# Patient Record
Sex: Female | Born: 1935 | Race: White | Hispanic: No | Marital: Married | Smoking: Former smoker
Health system: Southern US, Community
[De-identification: ages and names within clinical notes are randomized; demographics above are authoritative.]

## PROBLEM LIST (undated history)

## (undated) DIAGNOSIS — H3530 Unspecified macular degeneration: Secondary | ICD-10-CM

## (undated) DIAGNOSIS — I4891 Unspecified atrial fibrillation: Secondary | ICD-10-CM

## (undated) DIAGNOSIS — N200 Calculus of kidney: Secondary | ICD-10-CM

## (undated) DIAGNOSIS — H9190 Unspecified hearing loss, unspecified ear: Secondary | ICD-10-CM

## (undated) DIAGNOSIS — S065XAA Traumatic subdural hemorrhage with loss of consciousness status unknown, initial encounter: Secondary | ICD-10-CM

## (undated) DIAGNOSIS — I4892 Unspecified atrial flutter: Secondary | ICD-10-CM

## (undated) DIAGNOSIS — I10 Essential (primary) hypertension: Secondary | ICD-10-CM

## (undated) DIAGNOSIS — R569 Unspecified convulsions: Secondary | ICD-10-CM

## (undated) DIAGNOSIS — E785 Hyperlipidemia, unspecified: Secondary | ICD-10-CM

## (undated) DIAGNOSIS — S065X9A Traumatic subdural hemorrhage with loss of consciousness of unspecified duration, initial encounter: Secondary | ICD-10-CM

## (undated) HISTORY — DX: Hyperlipidemia, unspecified: E78.5

## (undated) HISTORY — DX: Unspecified atrial fibrillation: I48.91

## (undated) HISTORY — DX: Calculus of kidney: N20.0

## (undated) HISTORY — DX: Traumatic subdural hemorrhage with loss of consciousness status unknown, initial encounter: S06.5XAA

## (undated) HISTORY — DX: Unspecified convulsions: R56.9

## (undated) HISTORY — DX: Unspecified atrial flutter: I48.92

## (undated) HISTORY — DX: Traumatic subdural hemorrhage with loss of consciousness of unspecified duration, initial encounter: S06.5X9A

## (undated) HISTORY — DX: Unspecified hearing loss, unspecified ear: H91.90

## (undated) HISTORY — DX: Unspecified macular degeneration: H35.30

## (undated) HISTORY — PX: KIDNEY STONE SURGERY: SHX686

---

## 1995-09-24 HISTORY — PX: CATARACT EXTRACTION: SUR2

## 2001-03-12 ENCOUNTER — Ambulatory Visit (HOSPITAL_COMMUNITY): Admission: RE | Admit: 2001-03-12 | Discharge: 2001-03-12 | Payer: Self-pay | Admitting: Obstetrics and Gynecology

## 2001-03-12 ENCOUNTER — Encounter: Payer: Self-pay | Admitting: Obstetrics and Gynecology

## 2001-03-31 ENCOUNTER — Other Ambulatory Visit: Admission: RE | Admit: 2001-03-31 | Discharge: 2001-03-31 | Payer: Self-pay | Admitting: Obstetrics and Gynecology

## 2001-04-13 ENCOUNTER — Other Ambulatory Visit: Admission: RE | Admit: 2001-04-13 | Discharge: 2001-04-13 | Payer: Self-pay | Admitting: Dermatology

## 2001-04-16 ENCOUNTER — Ambulatory Visit (HOSPITAL_COMMUNITY): Admission: RE | Admit: 2001-04-16 | Discharge: 2001-04-16 | Payer: Self-pay | Admitting: Pulmonary Disease

## 2002-04-12 ENCOUNTER — Encounter: Payer: Self-pay | Admitting: Obstetrics and Gynecology

## 2002-04-12 ENCOUNTER — Ambulatory Visit (HOSPITAL_COMMUNITY): Admission: RE | Admit: 2002-04-12 | Discharge: 2002-04-12 | Payer: Self-pay | Admitting: Obstetrics and Gynecology

## 2002-10-04 ENCOUNTER — Other Ambulatory Visit: Admission: RE | Admit: 2002-10-04 | Discharge: 2002-10-04 | Payer: Self-pay | Admitting: Dermatology

## 2003-10-07 ENCOUNTER — Ambulatory Visit (HOSPITAL_COMMUNITY): Admission: RE | Admit: 2003-10-07 | Discharge: 2003-10-07 | Payer: Self-pay | Admitting: Pulmonary Disease

## 2003-11-03 ENCOUNTER — Other Ambulatory Visit: Admission: RE | Admit: 2003-11-03 | Discharge: 2003-11-03 | Payer: Self-pay | Admitting: Dermatology

## 2004-12-25 ENCOUNTER — Ambulatory Visit (HOSPITAL_COMMUNITY): Admission: RE | Admit: 2004-12-25 | Discharge: 2004-12-25 | Payer: Self-pay | Admitting: Pulmonary Disease

## 2005-12-27 ENCOUNTER — Ambulatory Visit (HOSPITAL_COMMUNITY): Admission: RE | Admit: 2005-12-27 | Discharge: 2005-12-27 | Payer: Self-pay | Admitting: Obstetrics and Gynecology

## 2006-09-23 HISTORY — PX: SHOULDER SURGERY: SHX246

## 2008-09-23 HISTORY — PX: ROTATOR CUFF REPAIR: SHX139

## 2018-09-23 HISTORY — PX: OTHER SURGICAL HISTORY: SHX169

## 2020-05-24 LAB — CBC AND DIFFERENTIAL
HCT: 42 (ref 36–46)
Hemoglobin: 14.4 (ref 12.0–16.0)
Platelets: 300 (ref 150–399)
WBC: 7.8

## 2020-05-24 LAB — LIPID PANEL
Cholesterol: 235 — AB (ref 0–200)
Triglycerides: 125 (ref 40–160)

## 2020-05-24 LAB — BASIC METABOLIC PANEL
CO2: 19 (ref 13–22)
Chloride: 97 — AB (ref 99–108)
Potassium: 4.7 (ref 3.4–5.3)
Sodium: 134 — AB (ref 137–147)

## 2020-05-24 LAB — HEPATIC FUNCTION PANEL
ALT: 16 (ref 7–35)
AST: 17 (ref 13–35)

## 2020-05-24 LAB — TSH: TSH: 3.24 (ref 0.41–5.90)

## 2020-05-24 LAB — COMPREHENSIVE METABOLIC PANEL
Albumin: 4.6 (ref 3.5–5.0)
Calcium: 10.5 (ref 8.7–10.7)
Globulin: 2.9

## 2020-05-24 LAB — CBC: RBC: 4.2 (ref 3.87–5.11)

## 2020-06-25 ENCOUNTER — Other Ambulatory Visit: Payer: Self-pay

## 2020-06-25 ENCOUNTER — Emergency Department (HOSPITAL_COMMUNITY): Payer: Medicare Other

## 2020-06-25 ENCOUNTER — Encounter (HOSPITAL_COMMUNITY): Payer: Self-pay | Admitting: Emergency Medicine

## 2020-06-25 ENCOUNTER — Inpatient Hospital Stay (HOSPITAL_COMMUNITY)
Admission: EM | Admit: 2020-06-25 | Discharge: 2020-06-29 | DRG: 066 | Disposition: A | Discharge status: Home / Self Care | Payer: Medicare Other | Attending: Family Medicine | Admitting: Family Medicine

## 2020-06-25 DIAGNOSIS — S065X9A Traumatic subdural hemorrhage with loss of consciousness of unspecified duration, initial encounter: Secondary | ICD-10-CM | POA: Diagnosis not present

## 2020-06-25 DIAGNOSIS — R569 Unspecified convulsions: Secondary | ICD-10-CM | POA: Diagnosis present

## 2020-06-25 DIAGNOSIS — I6202 Nontraumatic subacute subdural hemorrhage: Principal | ICD-10-CM | POA: Diagnosis present

## 2020-06-25 DIAGNOSIS — Z882 Allergy status to sulfonamides status: Secondary | ICD-10-CM

## 2020-06-25 DIAGNOSIS — Z23 Encounter for immunization: Secondary | ICD-10-CM

## 2020-06-25 DIAGNOSIS — R29810 Facial weakness: Secondary | ICD-10-CM | POA: Diagnosis present

## 2020-06-25 DIAGNOSIS — G8321 Monoplegia of upper limb affecting right dominant side: Secondary | ICD-10-CM | POA: Diagnosis present

## 2020-06-25 DIAGNOSIS — Z91013 Allergy to seafood: Secondary | ICD-10-CM

## 2020-06-25 DIAGNOSIS — Z20822 Contact with and (suspected) exposure to covid-19: Secondary | ICD-10-CM | POA: Diagnosis present

## 2020-06-25 DIAGNOSIS — I959 Hypotension, unspecified: Secondary | ICD-10-CM | POA: Diagnosis not present

## 2020-06-25 DIAGNOSIS — S065XAA Traumatic subdural hemorrhage with loss of consciousness status unknown, initial encounter: Secondary | ICD-10-CM

## 2020-06-25 DIAGNOSIS — I10 Essential (primary) hypertension: Secondary | ICD-10-CM | POA: Diagnosis present

## 2020-06-25 DIAGNOSIS — R4781 Slurred speech: Secondary | ICD-10-CM | POA: Diagnosis present

## 2020-06-25 HISTORY — DX: Calculus of kidney: N20.0

## 2020-06-25 HISTORY — DX: Essential (primary) hypertension: I10

## 2020-06-25 LAB — DIFFERENTIAL
Abs Immature Granulocytes: 0.02 10*3/uL (ref 0.00–0.07)
Basophils Absolute: 0 10*3/uL (ref 0.0–0.1)
Basophils Relative: 1 %
Eosinophils Absolute: 0 10*3/uL (ref 0.0–0.5)
Eosinophils Relative: 1 %
Immature Granulocytes: 0 %
Lymphocytes Relative: 24 %
Lymphs Abs: 1.3 10*3/uL (ref 0.7–4.0)
Monocytes Absolute: 1 10*3/uL (ref 0.1–1.0)
Monocytes Relative: 18 %
Neutro Abs: 3.1 10*3/uL (ref 1.7–7.7)
Neutrophils Relative %: 56 %

## 2020-06-25 LAB — PROTIME-INR
INR: 1.1 (ref 0.8–1.2)
Prothrombin Time: 13.7 seconds (ref 11.4–15.2)

## 2020-06-25 LAB — COMPREHENSIVE METABOLIC PANEL
ALT: 15 U/L (ref 0–44)
AST: 21 U/L (ref 15–41)
Albumin: 4 g/dL (ref 3.5–5.0)
Alkaline Phosphatase: 51 U/L (ref 38–126)
Anion gap: 12 (ref 5–15)
BUN: 13 mg/dL (ref 8–23)
CO2: 20 mmol/L — ABNORMAL LOW (ref 22–32)
Calcium: 9.8 mg/dL (ref 8.9–10.3)
Chloride: 97 mmol/L — ABNORMAL LOW (ref 98–111)
Creatinine, Ser: 0.99 mg/dL (ref 0.44–1.00)
GFR calc Af Amer: 59 mL/min — ABNORMAL LOW (ref >60)
GFR calc non Af Amer: 51 mL/min — ABNORMAL LOW (ref >60)
Glucose, Bld: 103 mg/dL — ABNORMAL HIGH (ref 70–99)
Potassium: 4.3 mmol/L (ref 3.5–5.1)
Sodium: 129 mmol/L — ABNORMAL LOW (ref 135–145)
Total Bilirubin: 1.3 mg/dL — ABNORMAL HIGH (ref 0.3–1.2)
Total Protein: 7.1 g/dL (ref 6.5–8.1)

## 2020-06-25 LAB — RESPIRATORY PANEL BY RT PCR (FLU A&B, COVID)
Influenza A by PCR: NEGATIVE
Influenza B by PCR: NEGATIVE
SARS Coronavirus 2 by RT PCR: NEGATIVE

## 2020-06-25 LAB — CBC
HCT: 38.4 % (ref 36.0–46.0)
Hemoglobin: 12.8 g/dL (ref 12.0–15.0)
MCH: 33.2 pg (ref 26.0–34.0)
MCHC: 33.3 g/dL (ref 30.0–36.0)
MCV: 99.7 fL (ref 80.0–100.0)
Platelets: 318 10*3/uL (ref 150–400)
RBC: 3.85 MIL/uL — ABNORMAL LOW (ref 3.87–5.11)
RDW: 12.6 % (ref 11.5–15.5)
WBC: 5.4 10*3/uL (ref 4.0–10.5)
nRBC: 0 % (ref 0.0–0.2)

## 2020-06-25 LAB — APTT: aPTT: 37 seconds — ABNORMAL HIGH (ref 24–36)

## 2020-06-25 MED ORDER — ACETAMINOPHEN 325 MG PO TABS: 650.0000 mg | ORAL_TABLET | Freq: Four times a day (QID) | ORAL | Status: DC | PRN

## 2020-06-25 MED ORDER — LATANOPROST 0.005 % OP SOLN
1.0000 [drp] | Freq: Every day | OPHTHALMIC | Status: DC
  Administered 2020-06-25 – 2020-06-28 (×4): 1 [drp] via OPHTHALMIC
  Filled 2020-06-25: qty 2.5

## 2020-06-25 MED ORDER — SODIUM CHLORIDE 0.9% FLUSH
3.0000 mL | Freq: Once | INTRAVENOUS | Status: AC
Start: 2020-06-25 — End: 2020-06-26
  Administered 2020-06-26: 3 mL via INTRAVENOUS

## 2020-06-25 MED ORDER — ACETAMINOPHEN 650 MG RE SUPP: 650.0000 mg | Freq: Four times a day (QID) | RECTAL | Status: DC | PRN

## 2020-06-25 MED ORDER — METOPROLOL SUCCINATE ER 50 MG PO TB24
50.0000 mg | ORAL_TABLET | Freq: Every day | ORAL | Status: DC
  Administered 2020-06-26 – 2020-06-29 (×4): 50 mg via ORAL
  Filled 2020-06-25 (×4): qty 1

## 2020-06-25 MED ORDER — RAMIPRIL 5 MG PO CAPS
10.0000 mg | ORAL_CAPSULE | Freq: Every day | ORAL | Status: DC
  Administered 2020-06-26 – 2020-06-27 (×2): 10 mg via ORAL
  Filled 2020-06-25 (×2): qty 2

## 2020-06-25 MED ORDER — POLYETHYLENE GLYCOL 3350 17 G PO PACK: 17.0000 g | PACK | Freq: Every day | ORAL | Status: DC | PRN

## 2020-06-25 MED ORDER — GADOBUTROL 1 MMOL/ML IV SOLN
6.5000 mL | Freq: Once | INTRAVENOUS | Status: AC | PRN
  Administered 2020-06-25: 6.5 mL via INTRAVENOUS

## 2020-06-25 NOTE — Consult Note (Signed)
Consult H&P  CC: transient paroxysmal events  History is obtained from: Patient  HPI: Nichole Cox is a 86 y.o. female recently moved from [place] had 4 brief episodes of cold sensation in her right arm with associated alteration of awareness. These episodes may have lasted 10-15 minutes.  She denies weakness, slurred speech or abnormal movements during these events. She has a remote history of transient global amnesia and she described the alteration of awareness during these events as having similar characteristics.   Imaging of her head showed extra axial hematoma.  Patient denies head trauma.   ROS: A complete ROS was performed and is negative except as noted in the HPI.   Past Medical History:  Diagnosis Date  . Hypertension   . Kidney stone     No family history on file.   Social History:  reports that she has never smoked. She has never used smokeless tobacco. She reports previous alcohol use. She reports that she does not use drugs.   Prior to Admission medications   Medication Sig Start Date End Date Taking? Authorizing Provider  latanoprost (XALATAN) 0.005 % ophthalmic solution Place 1 drop into both eyes at bedtime.   Yes [provider]  metoprolol succinate (TOPROL-XL) 50 MG 24 hr tablet Take 50 mg by mouth daily. Take with or immediately following a meal.   Yes [provider]  Multiple Vitamins-Minerals (PRESERVISION AREDS) CAPS Take 1 capsule by mouth daily.   Yes [provider]  ramipril (ALTACE) 10 MG capsule Take 10 mg by mouth daily.   Yes [provider]   Exam: Current vital signs: BP (!) 142/76 (BP Location: Left Arm)   Pulse 72   Temp 97.9 F (36.6 C) (Oral)   Resp 16   SpO2 100%    Physical Exam  Constitutional: Appears well-developed and well-nourished.  Psych: Affect appropriate to situation Eyes: No scleral injection HENT: No OP obstrucion Head: Normocephalic.  Cardiovascular: Normal rate and  regular rhythm.  Respiratory: Effort normal and breath sounds normal to anterior ascultation GI: Soft.  No distension. There is no tenderness.  Skin: WDI  Neuro: Mental Status: Patient is awake, alert, oriented to person, place, month, year, and situation. Patient is able to give a clear and coherent history. No signs of aphasia or neglect Cranial Nerves: II: Visual Fields are full. Pupils are equal, round, and reactive to light.   III,IV, VI: EOMI without ptosis or diploplia.  V: Facial sensation is symmetric to temperature VII: Facial movement is symmetric.  VIII: hearing is intact to voice X: Uvula elevates symmetrically XI: Shoulder shrug is symmetric. XII: tongue is midline without atrophy or fasciculations.  Motor: Tone is normal. Bulk is normal. 5/5 strength was present in all four extremities.  Sensory: Sensation is symmetric to light touch and temperature in the arms and legs. Deep Tendon Reflexes: 2+ and symmetric in the biceps and patellae.  Plantars: Toes are downgoing bilaterally.  Cerebellar: FNF and HKS are intact bilaterally  I have reviewed the images obtained: NCT head showed complex/loculated left cerebral convexity extra-axial fluid. collection measuring up to 1.5 cm posteriorly. MRI brain showed No acute infarct. Complex left cerebral convexity extra-axial hemorrhage, measuring up to approximately 13 mm posteriorly. Areas of T1 hyperintensity within the collection correlate with areas of hyperdensity seen on recent CT head and are compatible with recent hemorrhage. There is associated local mass effect without midline shift. MRA head and neck showed no evidence of significant arterial stenosis or  occlusion.   Impression: 86 year old woman with paroxysmal events of cold sensation in her right arm and alteration of awareness in setting of non-traumatic extra axial hematoma. Thought the duration of the events are not characteristic of seizure, cannot exclude  postictal state. Neurosurgery evaluated and felt no intervention was necessary. 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   Plan: rEEG Levetiracetam 500mg  two times daily. Will continue to follow.   Electronically signed by: Dr. Pager: [phone] 06/25/2020, 4:46 PM

## 2020-06-25 NOTE — ED Notes (Signed)
Heart healthy dinner tray ordered 

## 2020-06-25 NOTE — Progress Notes (Signed)
FPTS Interim Progress Note  S: She is stable with no complaints, resting comfortably in the ED.  O: BP 97/84   Pulse 83   Temp 97.9 F (36.6 C) (Oral)   Resp (!) 21   SpO2 99%   Physical Exam Vitals and nursing note reviewed.  Constitutional:      General: She is not in acute distress.    Appearance: Normal appearance. She is normal weight. She is not ill-appearing, toxic-appearing or diaphoretic.  HENT:     Head: Normocephalic and atraumatic.  Cardiovascular:     Rate and Rhythm: Normal rate and regular rhythm.     Pulses: Normal pulses.          Radial pulses are 2+ on the right side and 2+ on the left side.       Dorsalis pedis pulses are 2+ on the right side and 2+ on the left side.     Heart sounds: Normal heart sounds, S1 normal and S2 normal. No murmur heard.   Pulmonary:     Effort: Pulmonary effort is normal. No respiratory distress.     Breath sounds: Normal breath sounds. No wheezing.  Abdominal:     General: There is distension.     Tenderness: There is abdominal tenderness in the right upper quadrant and right lower quadrant.  Musculoskeletal:     Right lower leg: No edema.     Left lower leg: No edema.  Neurological:     Mental Status: She is alert. Mental status is at baseline.     Comments: Strength 5/5 Bilateral Upper and Lower extremities.  Psychiatric:        Mood and Affect: Mood normal.        Behavior: Behavior normal.        Thought Content: Thought content normal.      A/P: Will continue with current plans.   Lauro Franklin, MD 06/25/2020, 9:48 PM PGY-1, [facility] pager [phone]

## 2020-06-25 NOTE — ED Notes (Signed)
Pt transported to MRI 

## 2020-06-25 NOTE — ED Provider Notes (Signed)
[facility] EMERGENCY DEPARTMENT Provider Note   CSN: [id number] Arrival date & time: 06/25/20  1215     History Chief Complaint  Patient presents with  . Weakness    Nichole Cox is a 86 y.o. female.  4 episodes a right upper arm weakness and numbness since yesterday at noon.  Last episode was about 2 hours ago lasted about 15 minutes.  Maybe some facial droop and slurred speech during this event.  No symptoms currently.  No recent falls.  Not on any blood thinners.  No history of stroke.  The history is provided by the patient.  Neurologic Problem This is a new problem. The current episode started yesterday. Progression since onset: waxing and waning. Pertinent negatives include no chest Cox, no abdominal Cox, no headaches and no shortness of breath. Nothing aggravates the symptoms. Nothing relieves the symptoms. She has tried nothing for the symptoms. The treatment provided no relief.       Past Medical History:  Diagnosis Date  . Hypertension   . Kidney stone     There are no problems to display for this patient.   History reviewed. No pertinent surgical history.   OB History   No obstetric history on file.     No family history on file.  Social History   Tobacco Use  . Smoking status: Never Smoker  . Smokeless tobacco: Never Used  Substance Use Topics  . Alcohol use: Not Currently  . Drug use: Never    Home Medications Prior to Admission medications   Medication Sig Start Date End Date Taking? Authorizing Provider  latanoprost (XALATAN) 0.005 % ophthalmic solution Place 1 drop into both eyes at bedtime.   Yes [provider]  metoprolol succinate (TOPROL-XL) 50 MG 24 hr tablet Take 50 mg by mouth daily. Take with or immediately following a meal.   Yes [provider]  Multiple Vitamins-Minerals (PRESERVISION AREDS) CAPS Take 1 capsule by mouth daily.   Yes [provider]  ramipril (ALTACE) 10 MG  capsule Take 10 mg by mouth daily.   Yes [provider]    Allergies    Scallops [shellfish allergy] and Sulfa antibiotics  Review of Systems   Review of Systems  Constitutional: Negative for chills and fever.  HENT: Negative for ear Cox and sore throat.   Eyes: Negative for Cox and visual disturbance.  Respiratory: Negative for cough and shortness of breath.   Cardiovascular: Negative for chest Cox and palpitations.  Gastrointestinal: Negative for abdominal Cox and vomiting.  Genitourinary: Negative for dysuria and hematuria.  Musculoskeletal: Negative for arthralgias and back Cox.  Skin: Negative for color change and rash.  Neurological: Positive for speech difficulty, weakness and numbness. Negative for seizures, syncope and headaches.  All other systems reviewed and are negative.   Physical Exam Updated Vital Signs  ED Triage Vitals  Enc Vitals Group     BP 06/25/20 1228 (!) 168/81     Pulse Rate 06/25/20 1228 88     Resp 06/25/20 1228 18     Temp 06/25/20 1228 98.8 F (37.1 C)     Temp Source 06/25/20 1228 Oral     SpO2 06/25/20 1228 100 %     Weight --      Height --      Head Circumference --      Peak Flow --      Cox Score 06/25/20 1229 0     Cox Loc --  Cox Edu? --      Excl. in GC? --     Physical Exam Vitals and nursing note reviewed.  Constitutional:      General: She is not in acute distress.    Appearance: She is well-developed. She is not ill-appearing.  HENT:     Head: Normocephalic and atraumatic.     Nose: Nose normal.     Mouth/Throat:     Mouth: Mucous membranes are moist.  Eyes:     Extraocular Movements: Extraocular movements intact.     Conjunctiva/sclera: Conjunctivae normal.     Pupils: Pupils are equal, round, and reactive to light.  Cardiovascular:     Rate and Rhythm: Normal rate and regular rhythm.     Pulses: Normal pulses.     Heart sounds: Normal heart sounds. No murmur heard.   Pulmonary:      Effort: Pulmonary effort is normal. No respiratory distress.     Breath sounds: Normal breath sounds.  Abdominal:     Palpations: Abdomen is soft.     Tenderness: There is no abdominal tenderness.  Musculoskeletal:     Cervical back: Normal range of motion and neck supple.  Skin:    General: Skin is warm and dry.     Capillary Refill: Capillary refill takes less than 2 seconds.  Neurological:     General: No focal deficit present.     Mental Status: She is alert and oriented to person, place, and time.     Cranial Nerves: No cranial nerve deficit.     Sensory: No sensory deficit.     Motor: No weakness.     Coordination: Coordination normal.     Comments: 5+ out of 5 strength throughout, normal sensation, no drift, normal finger-to-nose finger, normal speech, no facial droop  Psychiatric:        Mood and Affect: Mood normal.     ED Results / Procedures / Treatments   Labs (all labs ordered are listed, but only abnormal results are displayed) Labs Reviewed  APTT - Abnormal; Notable for the following components:      Result Value   aPTT 37 (*)    All other components within normal limits  CBC - Abnormal; Notable for the following components:   RBC 3.85 (*)    All other components within normal limits  COMPREHENSIVE METABOLIC PANEL - Abnormal; Notable for the following components:   Sodium 129 (*)    Chloride 97 (*)    CO2 20 (*)    Glucose, Bld 103 (*)    Total Bilirubin 1.3 (*)    GFR calc non Af Amer 51 (*)    GFR calc Af Amer 59 (*)    All other components within normal limits  RESPIRATORY PANEL BY RT PCR (FLU A&B, COVID)  PROTIME-INR  DIFFERENTIAL    EKG EKG Interpretation  Date/Time:  Sunday June 25 2020 12:24:43 EDT Ventricular Rate:  82 PR Interval:  174 QRS Duration: 66 QT Interval:  358 QTC Calculation: 418 R Axis:   82 Text Interpretation: Normal sinus rhythm Normal ECG Confirmed by Virgina Norfolk [phone]) on 06/25/2020 1:29:02 PM   Radiology CT  HEAD WO CONTRAST  Result Date: 06/25/2020 CLINICAL DATA:  TIA.  Numbness and weakness. EXAM: CT HEAD WITHOUT CONTRAST TECHNIQUE: Contiguous axial images were obtained from the base of the skull through the vertex without intravenous contrast. COMPARISON:  None. FINDINGS: Brain: There is a complex/loculated left cerebral convexity extra-axial collection, which is largely intermediate  density with layering areas of hyperdensity. This collection measures up to 1.5 cm posteriorly (see series 5, image 48). There is resulting largely local mass effect with out substantial midline shift. Basal cisterns are patent. There is is patchy white matter hypoattenuation involving the white matter, which likely represents chronic microvascular ischemic disease. Focal hypodensity in the left basal ganglia, likely remote lacunar infarct or dilated perivascular space. Vascular: Calcific atherosclerosis. Skull: Normal. Negative for fracture or focal lesion. Sinuses/Orbits: No acute finding. Other: No mastoid effusions. IMPRESSION: 1. Complex/loculated left cerebral convexity extra-axial fluid collection measuring up to 1.5 cm posteriorly. While largely intermediate density, there are layering areas of hyperdensity which suggest recent/acute blood products. This collection exerts largely local mass effect without significant midline shift or basilar cistern effacement. 2. There is extensive white matter hypoattenuation which is favored to reflect the sequela of chronic microvascular ischemic disease. No definite evidence of acute large vascular territory infarct; however, a small white matter infarct could easily be obscured and there are no priors for comparison. MRI could better evaluate for acute infarct if clinically indicated. Findingds discussed Dr. Pilar PlateBero at 1:14 pm via telephone Electronically Signed   By: Feliberto HartsFrederick S Jones MD   On: 06/25/2020 13:14    Procedures .Critical Care Performed by: Virgina Norfolkuratolo, Jatziri Goffredo, DO Authorized  by: Virgina Norfolkuratolo, Evalynn Hankins, DO   Critical care provider statement:    Critical care time (minutes):  45   Critical care was necessary to treat or prevent imminent or life-threatening deterioration of the following conditions:  CNS failure or compromise   Critical care was time spent personally by me on the following activities:  Blood draw for specimens, development of treatment plan with patient or surrogate, discussions with consultants, discussions with primary provider, evaluation of patient's response to treatment, examination of patient, obtaining history from patient or surrogate, ordering and performing treatments and interventions, ordering and review of laboratory studies, ordering and review of radiographic studies, pulse oximetry, re-evaluation of patient's condition and review of old charts   I assumed direction of critical care for this patient from another provider in my specialty: no     (including critical care time)  Medications Ordered in ED Medications  sodium chloride flush (NS) 0.9 % injection 3 mL (has no administration in time range)    ED Course  I have reviewed the triage vital signs and the nursing notes.  Pertinent labs & imaging results that were available during my care of the patient were reviewed by me and considered in my medical decision making (see chart for details).    MDM Rules/Calculators/A&P                          Nichole Cox is an 86 year old female history of hypertension who presents to the ED with right arm weakness/strokelike symptoms.  Patient with overall unremarkable vitals.  States that she has had 4 episodes of right arm weakness and numbness.  Patient with symptoms that have resolved now.  Last episode was about 2 hours ago and lasted about 10 or 15 minutes.  She denies any head trauma, falls.  CT scan has already been done prior to my evaluation that shows complex/loculated left cerebral convexity of extra-axial fluid collection up to 1.5  cm posteriorly.  Likely recent/acute blood products given the hyperdensity.  There is some local mass-effect but no obvious midline shift.  Patient neurologically intact now.  Talked with Dr. Thomasena Edisollins with neurology who thinks possibly these  are seizures.  Will touch base with neurosurgery further recommendations.  Patient is not on blood thinners.  Denies any recent traumas or falls.  Lab work otherwise is unremarkable.  Neurology is recommending admission for EEG, MRI, MRA.  Will get neurosurgery plan as well.  Neurosurgery recommends MRI and MRA as well.  No acute surgical intervention needed at this time.  To be admitted for further care with medicine.  Neurology and neurosurgery to evaluate the patient in the emergency department.  Hemodynamically stable throughout my care.  Lab work unremarkable.  This chart was dictated using voice recognition software.  Despite best efforts to proofread,  errors can occur which can change the documentation meaning.    Final Clinical Impression(s) / ED Diagnoses Final diagnoses:  SDH (subdural hematoma) (HCC)  Seizure-like activity [facility])    Rx / DC Orders ED Discharge Orders    None       Virgina Norfolk, DO 06/25/20 1505

## 2020-06-25 NOTE — ED Triage Notes (Addendum)
Pt reports 2 episodes yesterday and 2 today with R arm weakness, numbness, and arm feeling cold.  Symptoms have currently resolved.  No arm drift.  Speech clear.

## 2020-06-25 NOTE — H&P (Addendum)
Family Medicine Teaching [facility] Admission History and Physical Service Pager: [phone]  Patient name: Nichole Cox Medical record number: [id number] Date of birth: 05-20-1929 Age: 86 y.o. Gender: female  Primary Care Provider: Patient, No Pcp Per Consultants: Neurology, neurosurgery Code Status: FULL Nichole Cox - emergency contact.    Chief Complaint: Right arm weakness  Assessment and Plan: Nichole Cox is a 86 y.o. female presenting with episodic right arm weakness found to have SDH. PMH is significant for HTN.  Transient right arm weakness  SDH Has had a total of 4 episodes over the past 2 days, sometimes associated with slurred speech.  Head imaging (CT head, MRI/MRA brain) with no evidence of acute infarct and evidence of left cerebral hemorrhage/SDH up to 13 mm without significant midline shift.  Unclear cause as patient has no history of head trauma and is not on any blood thinners. May be spontaneous.  Could represent TIA, but more likely represents seizures triggered by intracranial bleed irritating the meninges.  Neurology and neurosurgery consulted, appreciate involvement.  We will proceed with nonoperative management per neurosurgery given small size of SDH.  Plan for AED and EEG per neurology. - admit to FPTS, med-tele, Dr. Pollie Meyer attending - EEG tomorrow per neurology - may benefit from AED such as levetiracetam per neurology - AM labs: CBC, BMP, PT-INR, APTT, lipid panel, HbA1c - seizure precautions - cardiac monitoring - vitals per unit routine - PT/OT  HTN Home meds: metoprolol succinate 50 mg daily, ramipril 10 mg daily. Hypertensive to 176/70. - continue home metoprolol succinate and ramipril  FEN/GI: heart healthy diet Prophylaxis: SCDs  Disposition: med-tele  History of Present Illness:  Nichole Cox is a 86 y.o. female presenting with right arm weakness.  Patient began having episodes of right arm weakness starting  yesterday (two episodes yesterday, two episodes today) where she felt like her arm was "going cold" as well. These episodes lasted about 15 minutes. She did have some slurred speech with the episode this morning, but none yesterday. Denies headache, leg weakness. Denies trauma. Not on any blood thinners. No symptoms currently.  Just moved back to [place] with her daughter from [place], [place].   Pt is vaccinated against covid.   Drinks half a drink of scotch nightly.  Denies smoking.  Husband is a retired Sports administrator who has mild Alzheimers. She and him are on waiting list for [facility].     Review Of Systems: Per HPI with the following additions:   Review of Systems  Constitutional: Negative for chills, fever and malaise/fatigue.  Eyes: Negative for blurred vision and double vision.  Cardiovascular: Negative for chest pain, orthopnea and leg swelling.  Gastrointestinal: Negative for constipation, diarrhea, nausea and vomiting.  Genitourinary: Negative for dysuria.  Neurological: Negative for dizziness and headaches.    Patient Active Problem List   Diagnosis Date Noted  . Seizure (HCC) 06/25/2020    Past Medical History: Past Medical History:  Diagnosis Date  . Hypertension   . Kidney stone     Past Surgical History: History reviewed. No pertinent surgical history.  Social History: Social History   Tobacco Use  . Smoking status: Never Smoker  . Smokeless tobacco: Never Used  Substance Use Topics  . Alcohol use: Not Currently  . Drug use: Never   Additional social history: Recently moved from [place], [place] back to  with her daughter. Drinks 1/2 drink of scotch daily. Denies smoking.  Please also refer to relevant sections of EMR.  Family History: No family history on file.  Allergies and Medications: Allergies  Allergen Reactions  . Scallops [Shellfish Allergy] Hives and Shortness Of Breath  . Sulfa Antibiotics Rash   No current facility-administered  medications on file prior to encounter.   Current Outpatient Medications on File Prior to Encounter  Medication Sig Dispense Refill  . latanoprost (XALATAN) 0.005 % ophthalmic solution Place 1 drop into both eyes at bedtime.    . metoprolol succinate (TOPROL-XL) 50 MG 24 hr tablet Take 50 mg by mouth daily. Take with or immediately following a meal.    . Multiple Vitamins-Minerals (PRESERVISION AREDS) CAPS Take 1 capsule by mouth daily.    . ramipril (ALTACE) 10 MG capsule Take 10 mg by mouth daily.      Objective: BP (!) 176/70   Pulse 89   Temp 97.9 F (36.6 C) (Oral)   Resp (!) 24   SpO2 95%  Exam: General: Elderly woman appears younger than stated age, resting comfortably in bed, NAD Eyes: PERRL, EOMI ENTM: [place]/AT, MMM Neck: supple Cardiovascular: RRR, no murmurs Respiratory: CTAB Gastrointestinal: soft, non-tender, +BS MSK: moves all extremities Derm: no rash Neuro: alert, interactive, CN II-XII intact, 5/5 strength to upper and lower extremities bilaterally Psych: affect appropriate  Labs and Imaging: CBC BMET  Recent Labs  Lab 06/25/20 1232  WBC 5.4  HGB 12.8  HCT 38.4  PLT 318   Recent Labs  Lab 06/25/20 1232  NA 129*  K 4.3  CL 97*  CO2 20*  BUN 13  CREATININE 0.99  GLUCOSE 103*  CALCIUM 9.8     CT HEAD WO CONTRAST  Result Date: 06/25/2020 CLINICAL DATA:  TIA.  Numbness and weakness. EXAM: CT HEAD WITHOUT CONTRAST TECHNIQUE: Contiguous axial images were obtained from the base of the skull through the vertex without intravenous contrast. COMPARISON:  None. FINDINGS: Brain: There is a complex/loculated left cerebral convexity extra-axial collection, which is largely intermediate density with layering areas of hyperdensity. This collection measures up to 1.5 cm posteriorly (see series 5, image 48). There is resulting largely local mass effect with out substantial midline shift. Basal cisterns are patent. There is is patchy white matter hypoattenuation  involving the white matter, which likely represents chronic microvascular ischemic disease. Focal hypodensity in the left basal ganglia, likely remote lacunar infarct or dilated perivascular space. Vascular: Calcific atherosclerosis. Skull: Normal. Negative for fracture or focal lesion. Sinuses/Orbits: No acute finding. Other: No mastoid effusions. IMPRESSION: 1. Complex/loculated left cerebral convexity extra-axial fluid collection measuring up to 1.5 cm posteriorly. While largely intermediate density, there are layering areas of hyperdensity which suggest recent/acute blood products. This collection exerts largely local mass effect without significant midline shift or basilar cistern effacement. 2. There is extensive white matter hypoattenuation which is favored to reflect the sequela of chronic microvascular ischemic disease. No definite evidence of acute large vascular territory infarct; however, a small white matter infarct could easily be obscured and there are no priors for comparison. MRI could better evaluate for acute infarct if clinically indicated. Findingds discussed Dr. Pilar Plate at 1:14 pm via telephone Electronically Signed   By: Feliberto Harts MD   On: 06/25/2020 13:14   MR ANGIO HEAD WO CONTRAST  Result Date: 06/25/2020 CLINICAL DATA:  Four episodes of right upper arm weakness EXAM: MRI HEAD WITHOUT CONTRAST MRA HEAD WITHOUT CONTRAST MRA NECK WITHOUT AND WITH CONTRAST TECHNIQUE: Multiplanar, multiecho pulse sequences of the brain and surrounding structures were obtained without intravenous contrast. Angiographic images of the  Circle of Willis were obtained using MRA technique without intravenous contrast. Angiographic images of the neck were obtained using MRA technique without and with intravenous contrast. Carotid stenosis measurements (when applicable) are obtained utilizing NASCET criteria, using the distal internal carotid diameter as the denominator. COMPARISON:  None. FINDINGS: MRI HEAD  FINDINGS Brain: No acute infarct. Redemonstrated complex left cerebral convexity extra-axial hemorrhage, measuring up to approximately 13 mm posteriorly (series 17, image 6). Areas of T1 hyperintensity within the collection correlate with areas of hyperdensity seen on recent CT head and are compatible with recent hemorrhage. There is associated local mass effect without midline shift. Basal cisterns are patent. No hydrocephalus. There is scattered T2/FLAIR hyperintensity within the white matter, compatible with chronic microvascular ischemic disease. T2/FLAIR hyperintensities within bilateral basal ganglia, favored to reflect dilated perivascular spaces Vascular: Flow voids are maintained at the skull base. Skull and upper cervical spine: Normal marrow signal. Sinuses/Orbits: Mild ethmoid air cell mucosal thickening. No air-fluid levels. No acute orbital abnormality. Other: Moderate right and small left mastoid effusions. MRA HEAD FINDINGS Anterior circulation: Petrous and cavernous carotids are patent. There is mild left greater than right paraclinoid ICA narrowing. Bilateral M1 and proximal M2 MCAs are patent. Bilateral A1 and A2 ACA is are patent. No evidence of aneurysm. No specific evidence of vascular malformation or aneurysm. Posterior circulation. There is mild narrowing of the right V4 vertebral artery. The basilar artery is patent without evidence of significant stenosis. Bilateral posterior cerebral arteries are patent without significant stenosis or occlusion. No specific evidence of vascular malformation or aneurysm. MRA NECK FINDINGS Great vessel origins are patent. No evidence of significant (greater than 50%) arterial stenosis or occlusion within the neck. Mild narrowing at bilateral carotid bifurcations, likely related to atherosclerosis without evidence of greater than 50% narrowing of the common carotid arteries or internal carotid arteries. Mildly left dominant vertebral artery. There is mild  irregularity of bilateral V3 vertebral arteries as well as bilateral cervical ICAs. IMPRESSION: 1. No acute infarct. 2. Redemonstrated complex left cerebral convexity extra-axial hemorrhage, measuring up to approximately 13 mm posteriorly. Areas of T1 hyperintensity within the collection correlate with areas of hyperdensity seen on recent CT head and are compatible with recent hemorrhage. There is associated local mass effect without midline shift. 3. No evidence of significant (greater than 50%) arterial stenosis or occlusion within the head or neck. 4. Mild irregularity of bilateral V3 vertebral arteries as well as bilateral cervical ICAs, which may relate to atherosclerosis or fibromuscular dysplasia. Electronically Signed   By: Feliberto HartsFrederick S Jones MD   On: 06/25/2020 16:41   MR Angiogram Neck W or Wo Contrast  Result Date: 06/25/2020 CLINICAL DATA:  Four episodes of right upper arm weakness EXAM: MRI HEAD WITHOUT CONTRAST MRA HEAD WITHOUT CONTRAST MRA NECK WITHOUT AND WITH CONTRAST TECHNIQUE: Multiplanar, multiecho pulse sequences of the brain and surrounding structures were obtained without intravenous contrast. Angiographic images of the Circle of Willis were obtained using MRA technique without intravenous contrast. Angiographic images of the neck were obtained using MRA technique without and with intravenous contrast. Carotid stenosis measurements (when applicable) are obtained utilizing NASCET criteria, using the distal internal carotid diameter as the denominator. COMPARISON:  None. FINDINGS: MRI HEAD FINDINGS Brain: No acute infarct. Redemonstrated complex left cerebral convexity extra-axial hemorrhage, measuring up to approximately 13 mm posteriorly (series 17, image 6). Areas of T1 hyperintensity within the collection correlate with areas of hyperdensity seen on recent CT head and are compatible with recent hemorrhage. There is  associated local mass effect without midline shift. Basal cisterns are  patent. No hydrocephalus. There is scattered T2/FLAIR hyperintensity within the white matter, compatible with chronic microvascular ischemic disease. T2/FLAIR hyperintensities within bilateral basal ganglia, favored to reflect dilated perivascular spaces Vascular: Flow voids are maintained at the skull base. Skull and upper cervical spine: Normal marrow signal. Sinuses/Orbits: Mild ethmoid air cell mucosal thickening. No air-fluid levels. No acute orbital abnormality. Other: Moderate right and small left mastoid effusions. MRA HEAD FINDINGS Anterior circulation: Petrous and cavernous carotids are patent. There is mild left greater than right paraclinoid ICA narrowing. Bilateral M1 and proximal M2 MCAs are patent. Bilateral A1 and A2 ACA is are patent. No evidence of aneurysm. No specific evidence of vascular malformation or aneurysm. Posterior circulation. There is mild narrowing of the right V4 vertebral artery. The basilar artery is patent without evidence of significant stenosis. Bilateral posterior cerebral arteries are patent without significant stenosis or occlusion. No specific evidence of vascular malformation or aneurysm. MRA NECK FINDINGS Great vessel origins are patent. No evidence of significant (greater than 50%) arterial stenosis or occlusion within the neck. Mild narrowing at bilateral carotid bifurcations, likely related to atherosclerosis without evidence of greater than 50% narrowing of the common carotid arteries or internal carotid arteries. Mildly left dominant vertebral artery. There is mild irregularity of bilateral V3 vertebral arteries as well as bilateral cervical ICAs. IMPRESSION: 1. No acute infarct. 2. Redemonstrated complex left cerebral convexity extra-axial hemorrhage, measuring up to approximately 13 mm posteriorly. Areas of T1 hyperintensity within the collection correlate with areas of hyperdensity seen on recent CT head and are compatible with recent hemorrhage. There is  associated local mass effect without midline shift. 3. No evidence of significant (greater than 50%) arterial stenosis or occlusion within the head or neck. 4. Mild irregularity of bilateral V3 vertebral arteries as well as bilateral cervical ICAs, which may relate to atherosclerosis or fibromuscular dysplasia. Electronically Signed   By: Feliberto Harts MD   On: 06/25/2020 16:41   MR BRAIN WO CONTRAST  Result Date: 06/25/2020 CLINICAL DATA:  Four episodes of right upper arm weakness EXAM: MRI HEAD WITHOUT CONTRAST MRA HEAD WITHOUT CONTRAST MRA NECK WITHOUT AND WITH CONTRAST TECHNIQUE: Multiplanar, multiecho pulse sequences of the brain and surrounding structures were obtained without intravenous contrast. Angiographic images of the Circle of Willis were obtained using MRA technique without intravenous contrast. Angiographic images of the neck were obtained using MRA technique without and with intravenous contrast. Carotid stenosis measurements (when applicable) are obtained utilizing NASCET criteria, using the distal internal carotid diameter as the denominator. COMPARISON:  None. FINDINGS: MRI HEAD FINDINGS Brain: No acute infarct. Redemonstrated complex left cerebral convexity extra-axial hemorrhage, measuring up to approximately 13 mm posteriorly (series 17, image 6). Areas of T1 hyperintensity within the collection correlate with areas of hyperdensity seen on recent CT head and are compatible with recent hemorrhage. There is associated local mass effect without midline shift. Basal cisterns are patent. No hydrocephalus. There is scattered T2/FLAIR hyperintensity within the white matter, compatible with chronic microvascular ischemic disease. T2/FLAIR hyperintensities within bilateral basal ganglia, favored to reflect dilated perivascular spaces Vascular: Flow voids are maintained at the skull base. Skull and upper cervical spine: Normal marrow signal. Sinuses/Orbits: Mild ethmoid air cell mucosal  thickening. No air-fluid levels. No acute orbital abnormality. Other: Moderate right and small left mastoid effusions. MRA HEAD FINDINGS Anterior circulation: Petrous and cavernous carotids are patent. There is mild left greater than right paraclinoid ICA narrowing. Bilateral  M1 and proximal M2 MCAs are patent. Bilateral A1 and A2 ACA is are patent. No evidence of aneurysm. No specific evidence of vascular malformation or aneurysm. Posterior circulation. There is mild narrowing of the right V4 vertebral artery. The basilar artery is patent without evidence of significant stenosis. Bilateral posterior cerebral arteries are patent without significant stenosis or occlusion. No specific evidence of vascular malformation or aneurysm. MRA NECK FINDINGS Great vessel origins are patent. No evidence of significant (greater than 50%) arterial stenosis or occlusion within the neck. Mild narrowing at bilateral carotid bifurcations, likely related to atherosclerosis without evidence of greater than 50% narrowing of the common carotid arteries or internal carotid arteries. Mildly left dominant vertebral artery. There is mild irregularity of bilateral V3 vertebral arteries as well as bilateral cervical ICAs. IMPRESSION: 1. No acute infarct. 2. Redemonstrated complex left cerebral convexity extra-axial hemorrhage, measuring up to approximately 13 mm posteriorly. Areas of T1 hyperintensity within the collection correlate with areas of hyperdensity seen on recent CT head and are compatible with recent hemorrhage. There is associated local mass effect without midline shift. 3. No evidence of significant (greater than 50%) arterial stenosis or occlusion within the head or neck. 4. Mild irregularity of bilateral V3 vertebral arteries as well as bilateral cervical ICAs, which may relate to atherosclerosis or fibromuscular dysplasia. Electronically Signed   By: Feliberto Harts MD   On: 06/25/2020 16:41     Littie Deeds,  MD 06/25/2020, 9:33 PM PGY-1, [facility] Family Medicine FPTS Intern pager: [phone], text pages welcome   Resident Addendum I have separately seen and examined the patient.  I have discussed the findings and exam with the resident and agree with the above note.  I helped develop the management plan that is described in the resident's note and I agree with the content.  Changes have been made in BLUE.    Lenor Coffin, MD PGY-3 [facility] residency program

## 2020-06-25 NOTE — Consult Note (Signed)
Chief Complaint   Chief Complaint  Patient presents with  . Weakness    HPI   Consult requested by: Dr Carmie End, EDP [facility] Reason for consult: Left SDH  HPI:  Nichole Cox is a 86 y.o. female with history of HTN who presented to ED after having a fourth episode of right arm "coldness" and weakness.  Over the past 24 hours she has had four episodes of her right arm feeling what she describes as "cold". Associated with slurred speech, ?facial droop, weakness with right grip strength and incoordination of RUE. Episodes last 15-30 minutes when they occur. Her last episode was at noon today.  As part of work up a head CT was obtained which revealed a left sided acute/subacute SDH. A NSY consultation was requested. Neurology has also been consulted and believes symptoms may represent seizure. MRI/MRA brain ordered and pending. Patient feels well currently. No further symptoms since being in the ED. No N/T/W. Not on any blood thinning medications. No recent head trauma. She is currently in the process of moving from [place] back to [place].  There are no problems to display for this patient.   PMH: Past Medical History:  Diagnosis Date  . Hypertension   . Kidney stone     PSH: History reviewed. No pertinent surgical history.  (Not in a hospital admission)   SH: Social History   Tobacco Use  . Smoking status: Never Smoker  . Smokeless tobacco: Never Used  Substance Use Topics  . Alcohol use: Not Currently  . Drug use: Never    MEDS: Prior to Admission medications   Medication Sig Start Date End Date Taking? Authorizing Provider  latanoprost (XALATAN) 0.005 % ophthalmic solution Place 1 drop into both eyes at bedtime.   Yes [provider]  metoprolol succinate (TOPROL-XL) 50 MG 24 hr tablet Take 50 mg by mouth daily. Take with or immediately following a meal.   Yes [provider]  Multiple Vitamins-Minerals (PRESERVISION AREDS) CAPS Take 1 capsule by  mouth daily.   Yes [provider]  ramipril (ALTACE) 10 MG capsule Take 10 mg by mouth daily.   Yes [provider]    ALLERGY: Allergies  Allergen Reactions  . Scallops [Shellfish Allergy] Hives and Shortness Of Breath  . Sulfa Antibiotics Rash    Social History   Tobacco Use  . Smoking status: Never Smoker  . Smokeless tobacco: Never Used  Substance Use Topics  . Alcohol use: Not Currently     No family history on file.   ROS   Review of Systems  All other systems reviewed and are negative.   Exam   Vitals:   06/25/20 1228 06/25/20 1325  BP: (!) 168/81 (!) 142/76  Pulse: 88 72  Resp: 18 16  Temp: 98.8 F (37.1 C) 97.9 F (36.6 C)  SpO2: 100% 100%   General appearance: WDWN, NAD Eyes: No scleral injection Cardiovascular: Regular rate and rhythm without murmurs, rubs, gallops. No edema or variciosities. Distal pulses normal. Pulmonary: Effort normal, non-labored breathing Musculoskeletal:     Muscle tone upper extremities: Normal    Muscle tone lower extremities: Normal    Motor exam: Upper Extremities Deltoid Bicep Tricep Grip  Right 5/5 5/5 5/5 5/5  Left 5/5 5/5 5/5 5/5   Lower Extremity IP Quad PF DF EHL  Right 5/5 5/5 5/5 5/5 5/5  Left 5/5 5/5 5/5 5/5 5/5   Neurological Mental Status:    - Patient is awake, alert,  oriented to person, place, month, year, and situation    - Patient is able to give a clear and coherent history.    - No signs of aphasia or neglect Cranial Nerves    - II: Visual Fields are full. PERRL    - III/IV/VI: EOMI without ptosis or diploplia.     - V: Facial sensation is grossly normal    - VII: Facial movement is symmetric.     - VIII: hearing is intact to voice    - X: Uvula elevates symmetrically    - XI: Shoulder shrug is symmetric.    - XII: tongue is midline without atrophy or fasciculations.  Sensory: Sensation grossly intact to LT Deep Tendon Reflexes    - 2+ and symmetric in the biceps and  patellae. Plantars   - Toes are downgoing bilaterally.  Cerebellar    - FNF and HKS are intact bilaterally Mild right drift   Results - Imaging/Labs   Results for orders placed or performed during the hospital encounter of 06/25/20 (from the past 48 hour(s))  Protime-INR     Status: None   Collection Time: 06/25/20 12:32 PM  Result Value Ref Range   Prothrombin Time 13.7 11.4 - 15.2 seconds   INR 1.1 0.8 - 1.2    Comment: (NOTE) INR goal varies based on device and disease states. Performed at [facility], [address] [place], [place] [address]   APTT     Status: Abnormal   Collection Time: 06/25/20 12:32 PM  Result Value Ref Range   aPTT 37 (H) 24 - 36 seconds    Comment:        IF BASELINE aPTT IS ELEVATED, SUGGEST PATIENT RISK ASSESSMENT BE USED TO DETERMINE APPROPRIATE ANTICOAGULANT THERAPY. Performed at [facility], [address], [place], [place] [address]   CBC     Status: Abnormal   Collection Time: 06/25/20 12:32 PM  Result Value Ref Range   WBC 5.4 4.0 - 10.5 K/uL   RBC 3.85 (L) 3.87 - 5.11 MIL/uL   Hemoglobin 12.8 12.0 - 15.0 g/dL   HCT 09.8 36 - 46 %   MCV 99.7 80.0 - 100.0 fL   MCH 33.2 26.0 - 34.0 pg   MCHC 33.3 30.0 - 36.0 g/dL   RDW 11.9 14.7 - 82.9 %   Platelets 318 150 - 400 K/uL   nRBC 0.0 0.0 - 0.2 %    Comment: Performed at [facility], [address], [place], [place] [address]  Differential     Status: None   Collection Time: 06/25/20 12:32 PM  Result Value Ref Range   Neutrophils Relative % 56 %   Neutro Abs 3.1 1.7 - 7.7 K/uL   Lymphocytes Relative 24 %   Lymphs Abs 1.3 0.7 - 4.0 K/uL   Monocytes Relative 18 %   Monocytes Absolute 1.0 0 - 1 K/uL   Eosinophils Relative 1 %   Eosinophils Absolute 0.0 0 - 0 K/uL   Basophils Relative 1 %   Basophils Absolute 0.0 0 - 0 K/uL   Immature Granulocytes 0 %   Abs Immature Granulocytes 0.02 0.00 - 0.07 K/uL    Comment: Performed at [facility], [address], [place], [place] [address]  Comprehensive metabolic panel     Status: Abnormal   Collection Time: 06/25/20 12:32 PM  Result Value Ref Range   Sodium 129 (L) 135 - 145 mmol/L   Potassium  4.3 3.5 - 5.1 mmol/L   Chloride 97 (L) 98 - 111 mmol/L   CO2 20 (L) 22 - 32 mmol/L   Glucose, Bld 103 (H) 70 - 99 mg/dL    Comment: Glucose reference range applies only to samples taken after fasting for at least 8 hours.   BUN 13 8 - 23 mg/dL   Creatinine, Ser 5.73 0.44 - 1.00 mg/dL   Calcium 9.8 8.9 - 22.0 mg/dL   Total Protein 7.1 6.5 - 8.1 g/dL   Albumin 4.0 3.5 - 5.0 g/dL   AST 21 15 - 41 U/L   ALT 15 0 - 44 U/L   Alkaline Phosphatase 51 38 - 126 U/L   Total Bilirubin 1.3 (H) 0.3 - 1.2 mg/dL   GFR calc non Af Amer 51 (L) >60 mL/min   GFR calc Af Amer 59 (L) >60 mL/min   Anion gap 12 5 - 15    Comment: Performed at [facility], [address], [place], [place] [address]    CT HEAD WO CONTRAST  Result Date: 06/25/2020 CLINICAL DATA:  TIA.  Numbness and weakness. EXAM: CT HEAD WITHOUT CONTRAST TECHNIQUE: Contiguous axial images were obtained from the base of the skull through the vertex without intravenous contrast. COMPARISON:  None. FINDINGS: Brain: There is a complex/loculated left cerebral convexity extra-axial collection, which is largely intermediate density with layering areas of hyperdensity. This collection measures up to 1.5 cm posteriorly (see series 5, image 48). There is resulting largely local mass effect with out substantial midline shift. Basal cisterns are patent. There is is patchy white matter hypoattenuation involving the white matter, which likely represents chronic microvascular ischemic disease. Focal hypodensity in the left basal ganglia, likely remote lacunar infarct or dilated perivascular space. Vascular: Calcific atherosclerosis. Skull: Normal. Negative for fracture or focal lesion. Sinuses/Orbits: No acute finding. Other: No mastoid effusions. IMPRESSION: 1.  Complex/loculated left cerebral convexity extra-axial fluid collection measuring up to 1.5 cm posteriorly. While largely intermediate density, there are layering areas of hyperdensity which suggest recent/acute blood products. This collection exerts largely local mass effect without significant midline shift or basilar cistern effacement. 2. There is extensive white matter hypoattenuation which is favored to reflect the sequela of chronic microvascular ischemic disease. No definite evidence of acute large vascular territory infarct; however, a small white matter infarct could easily be obscured and there are no priors for comparison. MRI could better evaluate for acute infarct if clinically indicated. Findingds discussed Dr. Pilar Plate at 1:14 pm via telephone Electronically Signed   By: Feliberto Harts MD   On: 06/25/2020 13:14   Impression/Plan   86 y.o. female with 4 episodes of transient RUE weakness, incoordination and "coldness" who was found to have an acute/subacute left convexity SDH with local mass effect but no significant midline shift. She is currently asymptomatic and is neurologically intact. Suspect her symptoms are related to seizures from irritation of the SDH.   SDH is not significant enough to require surgical intervention at present. Would recommend completing CVA work up with MRI/MRA per Neurology. Will defer EEG and AED management to them. Plan on repeat head CT in the am to ensure stability. Avoid blood thinning medications. Please call for any concerns.  Cindra Presume, PA-C [facility]

## 2020-06-25 NOTE — ED Notes (Signed)
Pt assisted to the br steady gait  Her daughter has called about her mother update given

## 2020-06-26 ENCOUNTER — Observation Stay (HOSPITAL_COMMUNITY): Payer: Medicare Other

## 2020-06-26 DIAGNOSIS — S065X9A Traumatic subdural hemorrhage with loss of consciousness of unspecified duration, initial encounter: Secondary | ICD-10-CM

## 2020-06-26 DIAGNOSIS — S065XAA Traumatic subdural hemorrhage with loss of consciousness status unknown, initial encounter: Secondary | ICD-10-CM

## 2020-06-26 DIAGNOSIS — R569 Unspecified convulsions: Secondary | ICD-10-CM | POA: Diagnosis not present

## 2020-06-26 LAB — BASIC METABOLIC PANEL
Anion gap: 6 (ref 5–15)
BUN: 13 mg/dL (ref 8–23)
CO2: 28 mmol/L (ref 22–32)
Calcium: 9.3 mg/dL (ref 8.9–10.3)
Chloride: 97 mmol/L — ABNORMAL LOW (ref 98–111)
Creatinine, Ser: 1.11 mg/dL — ABNORMAL HIGH (ref 0.44–1.00)
GFR calc Af Amer: 51 mL/min — ABNORMAL LOW (ref >60)
GFR calc non Af Amer: 44 mL/min — ABNORMAL LOW (ref >60)
Glucose, Bld: 111 mg/dL — ABNORMAL HIGH (ref 70–99)
Potassium: 3.8 mmol/L (ref 3.5–5.1)
Sodium: 131 mmol/L — ABNORMAL LOW (ref 135–145)

## 2020-06-26 LAB — CBC
HCT: 36.2 % (ref 36.0–46.0)
Hemoglobin: 12.5 g/dL (ref 12.0–15.0)
MCH: 34.4 pg — ABNORMAL HIGH (ref 26.0–34.0)
MCHC: 34.5 g/dL (ref 30.0–36.0)
MCV: 99.7 fL (ref 80.0–100.0)
Platelets: 296 10*3/uL (ref 150–400)
RBC: 3.63 MIL/uL — ABNORMAL LOW (ref 3.87–5.11)
RDW: 12.5 % (ref 11.5–15.5)
WBC: 5.6 10*3/uL (ref 4.0–10.5)
nRBC: 0 % (ref 0.0–0.2)

## 2020-06-26 LAB — PROTIME-INR
INR: 1.1 (ref 0.8–1.2)
Prothrombin Time: 13.8 seconds (ref 11.4–15.2)

## 2020-06-26 LAB — LIPID PANEL
Cholesterol: 178 mg/dL (ref 0–200)
HDL: 54 mg/dL (ref >40)
LDL Cholesterol: 109 mg/dL — ABNORMAL HIGH (ref 0–99)
Total CHOL/HDL Ratio: 3.3 RATIO
Triglycerides: 75 mg/dL (ref <150)
VLDL: 15 mg/dL (ref 0–40)

## 2020-06-26 LAB — HEMOGLOBIN A1C
Hgb A1c MFr Bld: 5.6 % (ref 4.8–5.6)
Mean Plasma Glucose: 114.02 mg/dL

## 2020-06-26 LAB — APTT: aPTT: 35 seconds (ref 24–36)

## 2020-06-26 MED ORDER — LEVETIRACETAM IN NACL 500 MG/100ML IV SOLN
500.0000 mg | Freq: Two times a day (BID) | INTRAVENOUS | Status: DC
  Administered 2020-06-26: 500 mg via INTRAVENOUS
  Filled 2020-06-26: qty 100

## 2020-06-26 MED ORDER — SODIUM CHLORIDE 0.9 % IV SOLN
750.0000 mg | Freq: Two times a day (BID) | INTRAVENOUS | Status: DC
  Administered 2020-06-26 – 2020-06-27 (×2): 750 mg via INTRAVENOUS
  Filled 2020-06-26 (×4): qty 7.5

## 2020-06-26 MED ORDER — LEVETIRACETAM IN NACL 500 MG/100ML IV SOLN
500.0000 mg | INTRAVENOUS | Status: AC
  Administered 2020-06-26: 500 mg via INTRAVENOUS
  Filled 2020-06-26: qty 100

## 2020-06-26 NOTE — Evaluation (Addendum)
Occupational Therapy Evaluation Patient Details Name: Nichole Cox MRN: [id number] DOB: [birth year] Today's Date: 06/26/2020    History of Present Illness Patient is an 86 year old female who presents with 4 episodes of R UE weakness and numbness along with facial droop and slurred speech since 06/24/20. Head CT and MRI negative for acute infarct but "evidence of left cerebral hemorrhage/SDH up to 13 mm without significant midline shift", per MD note. 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 Medical hx consisting of HTN and kidney stones.   Clinical Impression   PT admitted with SDH 13 mm with possible seizures. Pt currently with functional limitiations due to the deficits listed below (see OT problem list). Pt noted during session to have the described change in status of R UE weakness, cool/ numb sensation in face/ arm, trouble with word finding and slurred speech, ataxic R UE movement and facial droop R side. Pt states "its happening" Pt aware of the full event and giving feedback as best as she can to staff. The whole event witnessed by RN as well and being documented for time and vitals.  Pt will benefit from skilled OT to increase their independence and safety with adls and balance to allow discharge outpatient.  Pt demonstrates some impulsive behavior and question if hearing is part of the source. Pt also attempting to sit on IV cord and visual deficits present also making this hard to completely rule out. Pt oriented and able to correctly report all events throughout session. Pt responds best to deep voice tone and slow speech     Follow Up Recommendations  Outpatient OT    Equipment Recommendations  None recommended by OT    Recommendations for Other Services       Precautions / Restrictions Precautions Precautions: Fall      Mobility Bed Mobility Overal bed mobility: Modified Independent                Transfers Overall  transfer level: Needs assistance   Transfers: Sit to/from Stand Sit to Stand: Supervision              Balance Overall balance assessment: Mild deficits observed, not formally tested (able to close eyes and touch head to simulate shower (S))                                         ADL either performed or assessed with clinical judgement   ADL Overall ADL's : Needs assistance/impaired     Grooming: Wash/dry hands;Supervision/safety;Standing   Upper Body Bathing: Supervision/ safety;Sitting               Toilet Transfer: Supervision/safety;Ambulation;Regular Social worker and Hygiene: Supervision/safety   Tub/ Engineer, structural: Walk-in shower;Supervision/safety   Functional mobility during ADLs: Supervision/safety General ADL Comments: pt was able to call and order lunch when suddenly her speech became more slurred, decreased word finding, R side of face with droop, R hand changes of sensation and decreased shoulder flexion, pt oriented      Vision Baseline Vision/History: Wears glasses;Macular Degeneration Wears Glasses: Reading only Additional Comments: pt with decreased peripheral vision at baseline     Perception     Praxis      Pertinent Vitals/Pain Pain Assessment: No/denies pain     Hand Dominance Right   Extremity/Trunk Assessment Upper Extremity Assessment Upper  Extremity Assessment: RUE deficits/detail RUE Deficits / Details: pt using RUE WFL even dialing phone when change of motor function occurred. pt with ataxic movement, decreased ability to sustain shoulder flexion with drift, reports cool numb feeling along the medial aspect of her arm and in her face  RUE Sensation: decreased proprioception RUE Coordination: decreased fine motor;decreased gross motor   Lower Extremity Assessment Lower Extremity Assessment: Defer to PT evaluation   Cervical / Trunk Assessment Cervical / Trunk Assessment:  Normal   Communication Communication Communication: HOH   Cognition Arousal/Alertness: Awake/alert Behavior During Therapy: Impulsive Overall Cognitive Status: Within Functional Limits for tasks assessed                                 General Comments: A&Ox4   General Comments  Rn called to room due to change in status at the end of session BP obtained and RN documenting all vitals in chart. Pt tearful due to onset. pt states "you are the first ones to ever see it happen"    Exercises     Shoulder Instructions      Home Living Family/patient expects to be discharged to:: Private residence Living Arrangements: Spouse/significant other;Children Available Help at Discharge: Family;Available 24 hours/day Type of Home: House Home Access: Level entry     Home Layout: Two level;Bed/bath upstairs;1/2 bath on main level Alternate Level Stairs-Number of Steps: flight Alternate Level Stairs-Rails: Right;Left;Can reach both Bathroom Shower/Tub: Tub/shower unit;Walk-in shower   Bathroom Toilet: Standard     Home Equipment: None   Additional Comments: On the waiting list for [facility]      Prior Functioning/Environment Level of Independence: Independent                 OT Problem List: Decreased strength;Decreased activity tolerance;Impaired balance (sitting and/or standing);Decreased coordination;Decreased knowledge of precautions;Impaired UE functional use      OT Treatment/Interventions: Self-care/ADL training;Therapeutic exercise;Energy conservation;Neuromuscular education;DME and/or AE instruction;Manual therapy;Modalities;Therapeutic activities;Patient/family education;Balance training;Cognitive remediation/compensation    OT Goals(Current goals can be found in the care plan section) Acute Rehab OT Goals Patient Stated Goal: to go home and assist her husband OT Goal Formulation: With patient Time For Goal Achievement: 07/10/20 Potential to Achieve  Goals: Good  OT Frequency: Min 2X/week   Barriers to D/C: Decreased caregiver support (caregiver for spouse but daughter can help them)          Co-evaluation              AM-PAC OT "6 Clicks" Daily Activity     Outcome Measure Help from another person eating meals?: A Little Help from another person taking care of personal grooming?: A Little Help from another person toileting, which includes using toliet, bedpan, or urinal?: A Little Help from another person bathing (including washing, rinsing, drying)?: A Little Help from another person to put on and taking off regular upper body clothing?: A Little Help from another person to put on and taking off regular lower body clothing?: A Little 6 Click Score: 18   End of Session Nurse Communication: Mobility status;Precautions  Activity Tolerance: Patient tolerated treatment well Patient left: in chair;with call bell/phone within reach;with nursing/sitter in room;Other (comment) (PA Felicie Morn)  OT Visit Diagnosis: Ataxia, unspecified (R27.0)                Time: 6256-3893 OT Time Calculation (min): 43 min Charges:  OT General Charges $OT Visit: 1 Visit OT  Evaluation $OT Eval Moderate Complexity: 1 Mod OT Treatments $Self Care/Home Management : 8-22 mins   [place], OTR/L  Acute Rehabilitation Services Pager: [phone] Office: [phone] .   Mateo Flow 06/26/2020, 1:24 PM

## 2020-06-26 NOTE — Progress Notes (Signed)
  NEUROSURGERY PROGRESS NOTE   Repeat head CT unchanged. Chart reviewed. Appears patient had another episode of RUE symptoms, speech difficulties. Neurology increased Keppra and ordered LTM EEG. Will follow from afar. Please call for any concerns.

## 2020-06-26 NOTE — Procedures (Signed)
Patient Name: Nichole Cox  MRN: [id number]  Epilepsy Attending: Charlsie Quest  Referring Physician/Provider: Dr Katha Cabal Date: 06/26/2020 Duration: 25.53 minutes  Patient history: 86 year old female with paroxysmal events of cold sensation in her right arm and alteration of awareness in the setting of nontraumatic extra-axial hematoma.  EEG to evaluate for seizures.  Level of alertness: Awake, drowsy  AEDs during EEG study: Keppra  Technical aspects: This EEG study was done with scalp electrodes positioned according to the 10-20 International system of electrode placement. Electrical activity was acquired at a sampling rate of 500Hz  and reviewed with a high frequency filter of 70Hz  and a low frequency filter of 1Hz . EEG data were recorded continuously and digitally stored.   Description: The posterior dominant rhythm consists of 8 Hz activity of moderate voltage (25-35 uV) seen predominantly in posterior head regions, symmetric and reactive to eye opening and eye closing. Drowsiness was characterized by attenuation of the posterior background rhythm. EEG showed intermittent 2 to 3 Hz delta slowing in left temporal region. Physiologic photic driving was not seen during photic stimulation.  Hyperventilation was not performed.     ABNORMALITY -Intermittent slow, left temporal region  IMPRESSION: This study is suggestive of nonspecific cortical dysfunction in left temporal region.  No seizures or epileptiform discharges were seen throughout the recording.   Nalini Alcaraz 

## 2020-06-26 NOTE — Evaluation (Signed)
Physical Therapy Evaluation Patient Details Name: Nichole Cox MRN: [id number] DOB: September 13, 1929 Today's Date: 06/26/2020   History of Present Illness  Patient is an 86 year old female who presents with 4 episodes of R UE weakness and numbness along with facial droop and slurred speech since 06/24/20. Head CT and MRI negative for acute infarct but "evidence of left cerebral hemorrhage/SDH up to 13 mm without significant midline shift", per MD note. 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 Medical hx consisting of HTN and kidney stones.  Clinical Impression  Pt demonstrates weakness in her R shoulder, unsteadiness with standing and gait, impulsive tendencies, and coordination deficits in her R UE. She tends to have minor losses of balance when initially coming to stand and displays mild trunk sway intermittently when ambulating. Pt requires cues to maintain her safety through slowing down her movements and pt educated on gaining her balance prior to moving when initially coming to stand. She also demonstrated moments of slowed speech and difficulty with correct word finding, but would correct herself. She has a history of R rotator cuff injury but reports increased weakness recently compared to normal. The patient did not need an AD at this time, but would benefit from further PT services to address her mentioned deficits to maximize her safety and independence with functional mobility in preparation for discharge home. Outpatient PT services is recommended at this time secondary to her noted new deficits in strength and balance as she was previously independent and needs to care for her husband.    Follow Up Recommendations Outpatient PT;Supervision for mobility/OOB (pt's daughter can drive her)    Equipment Recommendations  None recommended by PT    Recommendations for Other Services       Precautions / Restrictions Precautions Precautions:  Fall Restrictions Weight Bearing Restrictions: No      Mobility  Bed Mobility Overal bed mobility: Independent             General bed mobility comments: Pt able to transition supine > sit EOB within normal time range with the bed flat without assistance.  Transfers Overall transfer level: Needs assistance Equipment used: None Transfers: Sit to/from Stand Sit to Stand: Min guard         General transfer comment: Unsteadiness noted upon initial rise to stand, with pt able to recover balance with min guard. VC's provided to find balance prior to performing standing tasks or ambulating to maintain safety.  Ambulation/Gait Ambulation/Gait assistance: Min guard Gait Distance (Feet): 450 Feet Assistive device: None Gait Pattern/deviations: Step-through pattern (Intermittent lateral trunk sways)   Gait velocity interpretation: >4.37 ft/sec, indicative of normal walking speed General Gait Details: Pt able to change gait speed and head positions without overt LOB or change in gait.  Stairs Stairs: Yes Stairs assistance: Min guard Stair Management: One rail Right;Two rails;Alternating pattern;Sideways;Forwards Number of Stairs: 10 General stair comments: Ascended stairs with use of R hand rail with reciprocal pattern and no LOB, quick impulsive pace. Descended initial 5 steps with use of B UEs on L hand rail and descending sideways and then transitioned to B hand rails and anterior descent, to simulate home set-up, slight unsteadiness and quick pace noted.  Wheelchair Mobility    Modified Rankin (Stroke Patients Only) Modified Rankin (Stroke Patients Only) Pre-Morbid Rankin Score: No significant disability Modified Rankin: No significant disability     Balance Overall balance assessment: Mild deficits observed, not formally tested  Standardized Balance Assessment Standardized Balance Assessment :  (Able to turn head, nod head,  and change gait speed, no LOB)           Pertinent Vitals/Pain Pain Assessment: Faces Faces Pain Scale: No hurt Pain Intervention(s): Monitored during session    Home Living Family/patient expects to be discharged to:: Private residence (current; on wait list for independent living facility) Living Arrangements: Spouse/significant other;Children (Daughter home 24/7 currently) Available Help at Discharge: Family;Available 24 hours/day Type of Home: House (Townhouse) Home Access: Level entry     Home Layout: Two level;Bed/bath upstairs;1/2 bath on main level Home Equipment: None Additional Comments: On the waiting list for [facility]    Prior Function Level of Independence: Independent         Comments: Husband has the beginnings of Alzheimer's but she does not have to assist him with anything at this time.      Hand Dominance        Extremity/Trunk Assessment   Upper Extremity Assessment Upper Extremity Assessment: RUE deficits/detail;Overall WFL for tasks assessed RUE Deficits / Details: Noted R shoulder abduction and flexion 3+ to 4-/5 MMT scores; otherwise B UE MMT scores of 4+ to 5 throughout; pt reported hx of R rotator cuff injury with subsequent weakness but noted inc weakness now compared to recently RUE Sensation: WNL RUE Coordination: decreased gross motor (finger-to-nose diminished in aim compared to L)    Lower Extremity Assessment Lower Extremity Assessment: Overall WFL for tasks assessed (MMT of 4+ to 5 grossly throughout B LEs; sensation WNL)    Cervical / Trunk Assessment Cervical / Trunk Assessment: Normal  Communication   Communication: No difficulties  Cognition Arousal/Alertness: Awake/alert Behavior During Therapy: Impulsive Overall Cognitive Status: Within Functional Limits for tasks assessed                                 General Comments: A&Ox4      General Comments General comments (skin integrity, edema, etc.): No  edema noted LEs    Exercises     Assessment/Plan    PT Assessment Patient needs continued PT services  PT Problem List Decreased strength;Decreased balance;Decreased mobility;Decreased coordination;Decreased safety awareness       PT Treatment Interventions Gait training;Stair training;Functional mobility training;Therapeutic activities;Therapeutic exercise;Balance training;Neuromuscular re-education    PT Goals (Current goals can be found in the Care Plan section)  Acute Rehab PT Goals Patient Stated Goal: to go home and assist her husband PT Goal Formulation: With patient Time For Goal Achievement: 07/03/20 Potential to Achieve Goals: Good    Frequency Min 4X/week   Barriers to discharge        Co-evaluation               AM-PAC PT "6 Clicks" Mobility  Outcome Measure Help needed turning from your back to your side while in a flat bed without using bedrails?: None Help needed moving from lying on your back to sitting on the side of a flat bed without using bedrails?: None Help needed moving to and from a bed to a chair (including a wheelchair)?: A Little Help needed standing up from a chair using your arms (e.g., wheelchair or bedside chair)?: A Little Help needed to walk in hospital room?: A Little Help needed climbing 3-5 steps with a railing? : A Little 6 Click Score: 20    End of Session Equipment Utilized During Treatment: Gait belt Activity Tolerance: Patient  tolerated treatment well Patient left: in chair;with call bell/phone within reach Nurse Communication: Mobility status PT Visit Diagnosis: Unsteadiness on feet (R26.81);Other abnormalities of gait and mobility (R26.89);Muscle weakness (generalized) (M62.81);Other symptoms and signs involving the nervous system (R29.898)    Time: 5852-7782 PT Time Calculation (min) (ACUTE ONLY): 32 min   Charges:   PT Evaluation $PT Eval Low Complexity: 1 Low PT Treatments $Gait Training: 8-22 mins         Raymond Gurney, PT, DPT Acute Rehabilitation Services  Pager: [phone] Office: [phone]   Jewel Baize 06/26/2020, 9:15 AM

## 2020-06-26 NOTE — Progress Notes (Addendum)
Family Medicine Teaching Service Daily Progress Note Intern Pager: [phone]  Patient name: Nichole Cox Medical record number: [id number] Date of birth: 1929/05/25 Age: 86 y.o. Gender: female  Primary Care Provider: Patient, No Pcp Per Consultants: Neurology, Neurosurgery Code Status: Full  Pt Overview and Major Events to Date:  Patient admitted 10/3  Assessment and Plan:  Nichole Cox is an 86 year old female with a PMH significant for HTN that presented to the hospital on 10/3 with episodic right arm weakness.  Subdural hematoma Patient had a total of 4 episodes of right arm weakness, a "cold" sensation, and slurred speech that started on 10/2. CT head and MRI/MRA showed evidence of a left cerebral hemorrhage up to 1.5 cm exerting a local mass effect without significant midline shift. Neurology and neurosurgery consulted, appreciate all recommendations. Neurosurgery notes SDH not significant enough to require surgical intervention. Most recent episodes most likely represent seizure activity d/t irritation of the meninges. Neurology recommends starting patient on levetiracetam 500 mg BID and EEG. Neurosurgery recommends repeat CT head today. - EEG pending - Repeat CT head - Start Keppra 500 mg po BID - PT/OT evaluation  HTN Patient initially hypertensive at 168/81. Home medications include metoprolol succinate 50 mg qd and ramipril 10 mg qd. Patient currently normotensive at 109/61 after starting home medications. - Continue home medications  FEN/GI: Regular PPx: SCDs  Disposition: Home pending neurology clearance.  Subjective:  No acute overnight events. Patient evaluated at the bedside this morning. She denies any recent episodes similar to her previous right arm weakness and cold sensations. She does state that she has a mild headache generalized to the left parietal region. She denies any residual weakness, loss of sensation, or gross motor deficits.    Objective: Temp:  [97.7 F (36.5 C)-98.8 F (37.1 C)] 98.2 F (36.8 C) (10/04 0816) Pulse Rate:  [63-89] 72 (10/04 0816) Resp:  [15-24] 18 (10/04 0816) BP: (97-176)/(54-95) 122/61 (10/04 0816) SpO2:  [95 %-100 %] 99 % (10/04 0816) Physical Exam: General:  Patient is a well appearing elderly female sitting up in chair beside bed eating breakfast, in no apparent distress. Cardiovascular:  Normal S1 and S2 with no murmurs, rubs, or gallops. Regular rate and rhythm. Distal pulses 2+ and symmetrical. Respiratory:  CTA in all fields with no wheezes, rales, or rhonchi. Chest rise is symmetrical with no increased labor of breathing. Abdomen:  Soft, non distended. Non tender to palpation. Normoactive bowel sounds. Extremities: No lower extremity edema appreciated. Neurological: Alert and oriented x4. CN II-XII intact. Strength 5/5 in upper and lower extremities bilaterally.  Laboratory: Recent Labs  Lab 06/25/20 1232 06/26/20 0253  WBC 5.4 5.6  HGB 12.8 12.5  HCT 38.4 36.2  PLT 318 296   Recent Labs  Lab 06/25/20 1232 06/26/20 0253  NA 129* 131*  K 4.3 3.8  CL 97* 97*  CO2 20* 28  BUN 13 13  CREATININE 0.99 1.11*  CALCIUM 9.8 9.3  PROT 7.1  --   BILITOT 1.3*  --   ALKPHOS 51  --   ALT 15  --   AST 21  --   GLUCOSE 103* 111*    Imaging/Diagnostic Tests: CT HEAD WO CONTRAST  Result Date: 06/25/2020 CLINICAL DATA:  TIA.  Numbness and weakness. EXAM: CT HEAD WITHOUT CONTRAST TECHNIQUE: Contiguous axial images were obtained from the base of the skull through the vertex without intravenous contrast. COMPARISON:  None. FINDINGS: Brain: There is a complex/loculated left cerebral convexity extra-axial  collection, which is largely intermediate density with layering areas of hyperdensity. This collection measures up to 1.5 cm posteriorly (see series 5, image 48). There is resulting largely local mass effect with out substantial midline shift. Basal cisterns are patent. There is  is patchy white matter hypoattenuation involving the white matter, which likely represents chronic microvascular ischemic disease. Focal hypodensity in the left basal ganglia, likely remote lacunar infarct or dilated perivascular space. Vascular: Calcific atherosclerosis. Skull: Normal. Negative for fracture or focal lesion. Sinuses/Orbits: No acute finding. Other: No mastoid effusions. IMPRESSION: 1. Complex/loculated left cerebral convexity extra-axial fluid collection measuring up to 1.5 cm posteriorly. While largely intermediate density, there are layering areas of hyperdensity which suggest recent/acute blood products. This collection exerts largely local mass effect without significant midline shift or basilar cistern effacement. 2. There is extensive white matter hypoattenuation which is favored to reflect the sequela of chronic microvascular ischemic disease. No definite evidence of acute large vascular territory infarct; however, a small white matter infarct could easily be obscured and there are no priors for comparison. MRI could better evaluate for acute infarct if clinically indicated. Findingds discussed Dr. Pilar Plate at 1:14 pm via telephone Electronically Signed   By: Feliberto Harts MD   On: 06/25/2020 13:14   MR ANGIO HEAD WO CONTRAST  Result Date: 06/25/2020 CLINICAL DATA:  Four episodes of right upper arm weakness EXAM: MRI HEAD WITHOUT CONTRAST MRA HEAD WITHOUT CONTRAST MRA NECK WITHOUT AND WITH CONTRAST TECHNIQUE: Multiplanar, multiecho pulse sequences of the brain and surrounding structures were obtained without intravenous contrast. Angiographic images of the Circle of Willis were obtained using MRA technique without intravenous contrast. Angiographic images of the neck were obtained using MRA technique without and with intravenous contrast. Carotid stenosis measurements (when applicable) are obtained utilizing NASCET criteria, using the distal internal carotid diameter as the denominator.  COMPARISON:  None. FINDINGS: MRI HEAD FINDINGS Brain: No acute infarct. Redemonstrated complex left cerebral convexity extra-axial hemorrhage, measuring up to approximately 13 mm posteriorly (series 17, image 6). Areas of T1 hyperintensity within the collection correlate with areas of hyperdensity seen on recent CT head and are compatible with recent hemorrhage. There is associated local mass effect without midline shift. Basal cisterns are patent. No hydrocephalus. There is scattered T2/FLAIR hyperintensity within the white matter, compatible with chronic microvascular ischemic disease. T2/FLAIR hyperintensities within bilateral basal ganglia, favored to reflect dilated perivascular spaces Vascular: Flow voids are maintained at the skull base. Skull and upper cervical spine: Normal marrow signal. Sinuses/Orbits: Mild ethmoid air cell mucosal thickening. No air-fluid levels. No acute orbital abnormality. Other: Moderate right and small left mastoid effusions. MRA HEAD FINDINGS Anterior circulation: Petrous and cavernous carotids are patent. There is mild left greater than right paraclinoid ICA narrowing. Bilateral M1 and proximal M2 MCAs are patent. Bilateral A1 and A2 ACA is are patent. No evidence of aneurysm. No specific evidence of vascular malformation or aneurysm. Posterior circulation. There is mild narrowing of the right V4 vertebral artery. The basilar artery is patent without evidence of significant stenosis. Bilateral posterior cerebral arteries are patent without significant stenosis or occlusion. No specific evidence of vascular malformation or aneurysm. MRA NECK FINDINGS Great vessel origins are patent. No evidence of significant (greater than 50%) arterial stenosis or occlusion within the neck. Mild narrowing at bilateral carotid bifurcations, likely related to atherosclerosis without evidence of greater than 50% narrowing of the common carotid arteries or internal carotid arteries. Mildly left  dominant vertebral artery. There is mild irregularity of  bilateral V3 vertebral arteries as well as bilateral cervical ICAs. IMPRESSION: 1. No acute infarct. 2. Redemonstrated complex left cerebral convexity extra-axial hemorrhage, measuring up to approximately 13 mm posteriorly. Areas of T1 hyperintensity within the collection correlate with areas of hyperdensity seen on recent CT head and are compatible with recent hemorrhage. There is associated local mass effect without midline shift. 3. No evidence of significant (greater than 50%) arterial stenosis or occlusion within the head or neck. 4. Mild irregularity of bilateral V3 vertebral arteries as well as bilateral cervical ICAs, which may relate to atherosclerosis or fibromuscular dysplasia. Electronically Signed   By: Feliberto HartsFrederick S Jones MD   On: 06/25/2020 16:41   MR Angiogram Neck W or Wo Contrast  Result Date: 06/25/2020 CLINICAL DATA:  Four episodes of right upper arm weakness EXAM: MRI HEAD WITHOUT CONTRAST MRA HEAD WITHOUT CONTRAST MRA NECK WITHOUT AND WITH CONTRAST TECHNIQUE: Multiplanar, multiecho pulse sequences of the brain and surrounding structures were obtained without intravenous contrast. Angiographic images of the Circle of Willis were obtained using MRA technique without intravenous contrast. Angiographic images of the neck were obtained using MRA technique without and with intravenous contrast. Carotid stenosis measurements (when applicable) are obtained utilizing NASCET criteria, using the distal internal carotid diameter as the denominator. COMPARISON:  None. FINDINGS: MRI HEAD FINDINGS Brain: No acute infarct. Redemonstrated complex left cerebral convexity extra-axial hemorrhage, measuring up to approximately 13 mm posteriorly (series 17, image 6). Areas of T1 hyperintensity within the collection correlate with areas of hyperdensity seen on recent CT head and are compatible with recent hemorrhage. There is associated local mass effect  without midline shift. Basal cisterns are patent. No hydrocephalus. There is scattered T2/FLAIR hyperintensity within the white matter, compatible with chronic microvascular ischemic disease. T2/FLAIR hyperintensities within bilateral basal ganglia, favored to reflect dilated perivascular spaces Vascular: Flow voids are maintained at the skull base. Skull and upper cervical spine: Normal marrow signal. Sinuses/Orbits: Mild ethmoid air cell mucosal thickening. No air-fluid levels. No acute orbital abnormality. Other: Moderate right and small left mastoid effusions. MRA HEAD FINDINGS Anterior circulation: Petrous and cavernous carotids are patent. There is mild left greater than right paraclinoid ICA narrowing. Bilateral M1 and proximal M2 MCAs are patent. Bilateral A1 and A2 ACA is are patent. No evidence of aneurysm. No specific evidence of vascular malformation or aneurysm. Posterior circulation. There is mild narrowing of the right V4 vertebral artery. The basilar artery is patent without evidence of significant stenosis. Bilateral posterior cerebral arteries are patent without significant stenosis or occlusion. No specific evidence of vascular malformation or aneurysm. MRA NECK FINDINGS Great vessel origins are patent. No evidence of significant (greater than 50%) arterial stenosis or occlusion within the neck. Mild narrowing at bilateral carotid bifurcations, likely related to atherosclerosis without evidence of greater than 50% narrowing of the common carotid arteries or internal carotid arteries. Mildly left dominant vertebral artery. There is mild irregularity of bilateral V3 vertebral arteries as well as bilateral cervical ICAs. IMPRESSION: 1. No acute infarct. 2. Redemonstrated complex left cerebral convexity extra-axial hemorrhage, measuring up to approximately 13 mm posteriorly. Areas of T1 hyperintensity within the collection correlate with areas of hyperdensity seen on recent CT head and are compatible  with recent hemorrhage. There is associated local mass effect without midline shift. 3. No evidence of significant (greater than 50%) arterial stenosis or occlusion within the head or neck. 4. Mild irregularity of bilateral V3 vertebral arteries as well as bilateral cervical ICAs, which may relate to atherosclerosis or  fibromuscular dysplasia. Electronically Signed   By: Feliberto Harts MD   On: 06/25/2020 16:41   MR BRAIN WO CONTRAST  Result Date: 06/25/2020 CLINICAL DATA:  Four episodes of right upper arm weakness EXAM: MRI HEAD WITHOUT CONTRAST MRA HEAD WITHOUT CONTRAST MRA NECK WITHOUT AND WITH CONTRAST TECHNIQUE: Multiplanar, multiecho pulse sequences of the brain and surrounding structures were obtained without intravenous contrast. Angiographic images of the Circle of Willis were obtained using MRA technique without intravenous contrast. Angiographic images of the neck were obtained using MRA technique without and with intravenous contrast. Carotid stenosis measurements (when applicable) are obtained utilizing NASCET criteria, using the distal internal carotid diameter as the denominator. COMPARISON:  None. FINDINGS: MRI HEAD FINDINGS Brain: No acute infarct. Redemonstrated complex left cerebral convexity extra-axial hemorrhage, measuring up to approximately 13 mm posteriorly (series 17, image 6). Areas of T1 hyperintensity within the collection correlate with areas of hyperdensity seen on recent CT head and are compatible with recent hemorrhage. There is associated local mass effect without midline shift. Basal cisterns are patent. No hydrocephalus. There is scattered T2/FLAIR hyperintensity within the white matter, compatible with chronic microvascular ischemic disease. T2/FLAIR hyperintensities within bilateral basal ganglia, favored to reflect dilated perivascular spaces Vascular: Flow voids are maintained at the skull base. Skull and upper cervical spine: Normal marrow signal. Sinuses/Orbits: Mild  ethmoid air cell mucosal thickening. No air-fluid levels. No acute orbital abnormality. Other: Moderate right and small left mastoid effusions. MRA HEAD FINDINGS Anterior circulation: Petrous and cavernous carotids are patent. There is mild left greater than right paraclinoid ICA narrowing. Bilateral M1 and proximal M2 MCAs are patent. Bilateral A1 and A2 ACA is are patent. No evidence of aneurysm. No specific evidence of vascular malformation or aneurysm. Posterior circulation. There is mild narrowing of the right V4 vertebral artery. The basilar artery is patent without evidence of significant stenosis. Bilateral posterior cerebral arteries are patent without significant stenosis or occlusion. No specific evidence of vascular malformation or aneurysm. MRA NECK FINDINGS Great vessel origins are patent. No evidence of significant (greater than 50%) arterial stenosis or occlusion within the neck. Mild narrowing at bilateral carotid bifurcations, likely related to atherosclerosis without evidence of greater than 50% narrowing of the common carotid arteries or internal carotid arteries. Mildly left dominant vertebral artery. There is mild irregularity of bilateral V3 vertebral arteries as well as bilateral cervical ICAs. IMPRESSION: 1. No acute infarct. 2. Redemonstrated complex left cerebral convexity extra-axial hemorrhage, measuring up to approximately 13 mm posteriorly. Areas of T1 hyperintensity within the collection correlate with areas of hyperdensity seen on recent CT head and are compatible with recent hemorrhage. There is associated local mass effect without midline shift. 3. No evidence of significant (greater than 50%) arterial stenosis or occlusion within the head or neck. 4. Mild irregularity of bilateral V3 vertebral arteries as well as bilateral cervical ICAs, which may relate to atherosclerosis or fibromuscular dysplasia. Electronically Signed   By: Feliberto Harts MD   On: 06/25/2020 16:41      Loren Racer, Medical Student 06/26/2020, 9:30 AM FPTS Intern pager: [phone], text pages welcome     RESIDENT ATTESTATION OF STUDENT NOTE   I have seen and examined this patient.    I have discussed the findings and exam with the medical student and agree with the above note, which I have edited appropriately. I helped develop the management plan that is described in the student's note, and I agree with the content.   Katha Cabal, DO  PGY-1, [facility] 06/26/2020 4:24 PM

## 2020-06-26 NOTE — Progress Notes (Signed)
  NEUROSURGERY PROGRESS NOTE   Had an episode of ?slurred speech when waking up in the ED, but no further episodes of RUE "coldness" or weakness. Keppra has not been started yet based on EMR review.  EXAM:  BP 109/61 (BP Location: Right Arm)   Pulse 75   Temp 98 F (36.7 C)   Resp 18   SpO2 98%   Awake, alert, oriented  Speech fluent, appropriate  CN grossly intact  5/5 BUE/BLE   IMPRESSION/PLAN 86 y.o. female with transient RUE symptoms in the setting of an acute/subacute left SDH. She remains neurologically intact.  - Repeat head CT at noon to ensure there has been no worsening. Assuming CT is stable, patient is cleared for d/c from a NS perspective. Will await repeat head CT

## 2020-06-26 NOTE — Progress Notes (Signed)
vL[facility] EEG started with new leads, Notified Neuro

## 2020-06-26 NOTE — Progress Notes (Signed)
EEG complete - results pending 

## 2020-06-26 NOTE — Discharge Summary (Addendum)
Family Medicine Teaching [facility] Discharge Summary  Patient name: Nichole Cox Medical record number: [id number] Date of birth: [birth year] Age: 86 y.o. Gender: female Date of Admission: 06/25/2020  Date of Discharge: 06/29/2020 Admitting Physician: Sandre Kitty, MD  Primary Care Provider: Patient, No Pcp Per Consultants: Neurology, Neurosurgery  Indication for Hospitalization: Subdural hematoma  Discharge Diagnoses/Problem List:  Subdural hematoma Hypertension  Disposition: Home with outpatient PT  Discharge Condition: Stable and improved  Discharge Exam:  Physical Exam: General:  Patient is a pleasant elderly female lying supine in bed, resting comfortably, in no apparent distress. Cardiovascular:  Normal S1 and S2 with no murmurs, rubs, or gallops. Regular rate and rhythm. Distal pulses 2+ and symmetrical. Respiratory:  CTA in all fields with no wheezing, rales, or rhonchi. Chest rise is symmetrical with no increased labor of breathing. Abdomen:  Non distended. Non tender to palpation. Extremities:  No lower extremity edema. Neurological: Alert and oriented x4. Normal mentation. CN II-XII intact. Muscle strength 5/5 bilaterally in the upper and lower extremities.  Brief Hospital Course:  Nichole Cox is an 86 year old female with a PMH significant for HTN that presented to the hospital on 10/3 with episodic right arm weakness, a "cold" sensation throughout the arm, and intermittent slurred speech.    Subdural hematoma Patient presented with a total of 4 episodes over 2 days of transient right arm weakness, a "cold" sensation, and slurred speech. CT head, MRI/MRA brain demonstrated no evidence of any acute infarct, but did reveal a left cerebral hemorrhage up to 13 mm with local mass effect and without any significant midline shift. Etiology of the subdural hematoma was unclear, as the patient did not have any recent history of falls, head trauma, or  anticoagulation. Her symptoms were most likely related to seizures triggered by SDH. Neurology and neurosurgery were consulted. Neurosurgery recommended no surgical intervention. Repeat CT head was grossly unchanged. Neurology recommended EEG which revealed nonspecific cortical dysfunction in the left temporal region with no seizures or epileptiform discharges. The patient two episodes of seizure like activity. 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 Keppra was increased to1000 mg BID. Neurosurgery considered evacuation of the SHD if seizures were not well controlled with medications. She remained seizure free for >24 hours prior to discharge. She has follow up with neurosurgery and neurology.    Hypotension with history of hypertension Patient was initially hypertensive at 168/81. Her home medications including metoprolol succinate 50 mg qd and ramipril 10 mg qd were started in the hospital. She become hypotensive, with blood pressures measuring as low as 89/49. Her ramipril was discontinued. She remained on her metoprolol 50 mg qd and her blood pressure stabilized.  Issues for Follow Up:  Follow up with [facility] in 2-3 weeks for repeat CT head to ensure SDH is resolving. Follow up with [facility] in 8-12 weeks.  Consider restarting ramipril, if BP is elevated at follow up.     Significant Procedures: None  Significant Labs and Imaging:  Recent Labs  Lab 06/25/20 1232 06/26/20 0253  WBC 5.4 5.6  HGB 12.8 12.5  HCT 38.4 36.2  PLT 318 296   Recent Labs  Lab 06/25/20 1232 06/25/20 1232 06/26/20 0253 06/26/20 0253 06/27/20 0908 06/28/20 0410  NA 129*  --  131*  --  129* 132*  K 4.3   < > 3.8   < > 5.1 4.5  CL 97*  --  97*  --  97* 101  CO2 20*  --  28  --  22 20*  GLUCOSE 103*  --  111*  --  106* 106*  BUN 13  --  13  --  14 16  CREATININE 0.99  --  1.11*  --   0.90 0.90  CALCIUM 9.8  --  9.3  --  9.2 8.9  ALKPHOS 51  --   --   --   --   --   AST 21  --   --   --   --   --   ALT 15  --   --   --   --   --   ALBUMIN 4.0  --   --   --   --   --    < > = values in this interval not displayed.    CT HEAD WO CONTRAST  Result Date: 06/26/2020 CLINICAL DATA:  Follow-up intracranial hemorrhage. EXAM: CT HEAD WITHOUT CONTRAST TECHNIQUE: Contiguous axial images were obtained from the base of the skull through the vertex without intravenous contrast. COMPARISON:  06/25/2020 head CT and MRI FINDINGS: Brain: A largely intermediate attenuation subdural hematoma over the left cerebral convexity is unchanged in size with maximal thickness of 16 mm in the parietal region. Mild mass effect is unchanged, and there is no significant midline shift. No new intracranial hemorrhage, acute infarct, or mass is identified. There is mild cerebral atrophy. Patchy hypodensities in the cerebral white matter bilaterally are unchanged and nonspecific but compatible with moderate chronic small vessel ischemic disease. 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 Vascular: Calcified atherosclerosis at the skull base. No hyperdense vessel. Skull: No fracture or suspicious osseous lesion. Sinuses/Orbits: Visualized paranasal sinuses and mastoid air cells are clear. Bilateral cataract extraction. Other: None. IMPRESSION: 1. Unchanged left cerebral convexity subdural hematoma. 2. No evidence of new intracranial abnormality. 3. Moderate chronic small vessel ischemic disease. Electronically Signed   By: Sebastian Ache M.D.   On: 06/26/2020 13:21   CT HEAD WO CONTRAST  Result Date: 06/25/2020 CLINICAL DATA:  TIA.  Numbness and weakness. EXAM: CT HEAD WITHOUT CONTRAST TECHNIQUE: Contiguous axial images were obtained from the base of the skull through the vertex without intravenous contrast. COMPARISON:  None. FINDINGS: Brain: There is a complex/loculated left cerebral  convexity extra-axial collection, which is largely intermediate density with layering areas of hyperdensity. This collection measures up to 1.5 cm posteriorly (see series 5, image 48). There is resulting largely local mass effect with out substantial midline shift. Basal cisterns are patent. There is is patchy white matter hypoattenuation involving the white matter, which likely represents chronic microvascular ischemic disease. Focal hypodensity in the left basal ganglia, likely remote lacunar infarct or dilated perivascular space. Vascular: Calcific atherosclerosis. Skull: Normal. Negative for fracture or focal lesion. Sinuses/Orbits: No acute finding. Other: No mastoid effusions. IMPRESSION: 1. Complex/loculated left cerebral convexity extra-axial fluid collection measuring up to 1.5 cm posteriorly. While largely intermediate density, there are layering areas of hyperdensity which suggest recent/acute blood products. This collection exerts largely local mass effect without significant midline shift or basilar cistern effacement. 2. There is extensive white matter hypoattenuation which is favored to reflect the sequela of chronic microvascular ischemic disease. No definite evidence of acute large vascular territory infarct; however, a small white matter infarct could easily be obscured and there are no priors for comparison. MRI could better evaluate for acute  infarct if clinically indicated. Findingds discussed Dr. Pilar Plate at 1:14 pm via telephone Electronically Signed   By: Feliberto Harts MD   On: 06/25/2020 13:14   MR ANGIO HEAD WO CONTRAST  Result Date: 06/25/2020 CLINICAL DATA:  Four episodes of right upper arm weakness EXAM: MRI HEAD WITHOUT CONTRAST MRA HEAD WITHOUT CONTRAST MRA NECK WITHOUT AND WITH CONTRAST TECHNIQUE: Multiplanar, multiecho pulse sequences of the brain and surrounding structures were obtained without intravenous contrast. Angiographic images of the Circle of Willis were obtained using  MRA technique without intravenous contrast. Angiographic images of the neck were obtained using MRA technique without and with intravenous contrast. Carotid stenosis measurements (when applicable) are obtained utilizing NASCET criteria, using the distal internal carotid diameter as the denominator. COMPARISON:  None. FINDINGS: MRI HEAD FINDINGS Brain: No acute infarct. Redemonstrated complex left cerebral convexity extra-axial hemorrhage, measuring up to approximately 13 mm posteriorly (series 17, image 6). Areas of T1 hyperintensity within the collection correlate with areas of hyperdensity seen on recent CT head and are compatible with recent hemorrhage. There is associated local mass effect without midline shift. Basal cisterns are patent. No hydrocephalus. There is scattered T2/FLAIR hyperintensity within the white matter, compatible with chronic microvascular ischemic disease. T2/FLAIR hyperintensities within bilateral basal ganglia, favored to reflect dilated perivascular spaces Vascular: Flow voids are maintained at the skull base. Skull and upper cervical spine: Normal marrow signal. Sinuses/Orbits: Mild ethmoid air cell mucosal thickening. No air-fluid levels. No acute orbital abnormality. Other: Moderate right and small left mastoid effusions. MRA HEAD FINDINGS Anterior circulation: Petrous and cavernous carotids are patent. There is mild left greater than right paraclinoid ICA narrowing. Bilateral M1 and proximal M2 MCAs are patent. Bilateral A1 and A2 ACA is are patent. No evidence of aneurysm. No specific evidence of vascular malformation or aneurysm. Posterior circulation. There is mild narrowing of the right V4 vertebral artery. The basilar artery is patent without evidence of significant stenosis. Bilateral posterior cerebral arteries are patent without significant stenosis or occlusion. No specific evidence of vascular malformation or aneurysm. MRA NECK FINDINGS Great vessel origins are patent. No  evidence of significant (greater than 50%) arterial stenosis or occlusion within the neck. Mild narrowing at bilateral carotid bifurcations, likely related to atherosclerosis without evidence of greater than 50% narrowing of the common carotid arteries or internal carotid arteries. Mildly left dominant vertebral artery. There is mild irregularity of bilateral V3 vertebral arteries as well as bilateral cervical ICAs. IMPRESSION: 1. No acute infarct. 2. Redemonstrated complex left cerebral convexity extra-axial hemorrhage, measuring up to approximately 13 mm posteriorly. Areas of T1 hyperintensity within the collection correlate with areas of hyperdensity seen on recent CT head and are compatible with recent hemorrhage. There is associated local mass effect without midline shift. 3. No evidence of significant (greater than 50%) arterial stenosis or occlusion within the head or neck. 4. Mild irregularity of bilateral V3 vertebral arteries as well as bilateral cervical ICAs, which may relate to atherosclerosis or fibromuscular dysplasia. Electronically Signed   By: Feliberto Harts MD   On: 06/25/2020 16:41   MR Angiogram Neck W or Wo Contrast  Result Date: 06/25/2020 CLINICAL DATA:  Four episodes of right upper arm weakness EXAM: MRI HEAD WITHOUT CONTRAST MRA HEAD WITHOUT CONTRAST MRA NECK WITHOUT AND WITH CONTRAST TECHNIQUE: Multiplanar, multiecho pulse sequences of the brain and surrounding structures were obtained without intravenous contrast. Angiographic images of the Circle of Willis were obtained using MRA technique without intravenous contrast. Angiographic images of the neck  were obtained using MRA technique without and with intravenous contrast. Carotid stenosis measurements (when applicable) are obtained utilizing NASCET criteria, using the distal internal carotid diameter as the denominator. COMPARISON:  None. FINDINGS: MRI HEAD FINDINGS Brain: No acute infarct. Redemonstrated complex left cerebral  convexity extra-axial hemorrhage, measuring up to approximately 13 mm posteriorly (series 17, image 6). Areas of T1 hyperintensity within the collection correlate with areas of hyperdensity seen on recent CT head and are compatible with recent hemorrhage. There is associated local mass effect without midline shift. Basal cisterns are patent. No hydrocephalus. There is scattered T2/FLAIR hyperintensity within the white matter, compatible with chronic microvascular ischemic disease. T2/FLAIR hyperintensities within bilateral basal ganglia, favored to reflect dilated perivascular spaces Vascular: Flow voids are maintained at the skull base. Skull and upper cervical spine: Normal marrow signal. Sinuses/Orbits: Mild ethmoid air cell mucosal thickening. No air-fluid levels. No acute orbital abnormality. Other: Moderate right and small left mastoid effusions. MRA HEAD FINDINGS Anterior circulation: Petrous and cavernous carotids are patent. There is mild left greater than right paraclinoid ICA narrowing. Bilateral M1 and proximal M2 MCAs are patent. Bilateral A1 and A2 ACA is are patent. No evidence of aneurysm. No specific evidence of vascular malformation or aneurysm. Posterior circulation. There is mild narrowing of the right V4 vertebral artery. The basilar artery is patent without evidence of significant stenosis. Bilateral posterior cerebral arteries are patent without significant stenosis or occlusion. No specific evidence of vascular malformation or aneurysm. MRA NECK FINDINGS Great vessel origins are patent. No evidence of significant (greater than 50%) arterial stenosis or occlusion within the neck. Mild narrowing at bilateral carotid bifurcations, likely related to atherosclerosis without evidence of greater than 50% narrowing of the common carotid arteries or internal carotid arteries. Mildly left dominant vertebral artery. There is mild irregularity of bilateral V3 vertebral arteries as well as bilateral  cervical ICAs. IMPRESSION: 1. No acute infarct. 2. Redemonstrated complex left cerebral convexity extra-axial hemorrhage, measuring up to approximately 13 mm posteriorly. Areas of T1 hyperintensity within the collection correlate with areas of hyperdensity seen on recent CT head and are compatible with recent hemorrhage. There is associated local mass effect without midline shift. 3. No evidence of significant (greater than 50%) arterial stenosis or occlusion within the head or neck. 4. Mild irregularity of bilateral V3 vertebral arteries as well as bilateral cervical ICAs, which may relate to atherosclerosis or fibromuscular dysplasia. Electronically Signed   By: Feliberto Harts MD   On: 06/25/2020 16:41   MR BRAIN WO CONTRAST  Result Date: 06/25/2020 CLINICAL DATA:  Four episodes of right upper arm weakness EXAM: MRI HEAD WITHOUT CONTRAST MRA HEAD WITHOUT CONTRAST MRA NECK WITHOUT AND WITH CONTRAST TECHNIQUE: Multiplanar, multiecho pulse sequences of the brain and surrounding structures were obtained without intravenous contrast. Angiographic images of the Circle of Willis were obtained using MRA technique without intravenous contrast. Angiographic images of the neck were obtained using MRA technique without and with intravenous contrast. Carotid stenosis measurements (when applicable) are obtained utilizing NASCET criteria, using the distal internal carotid diameter as the denominator. COMPARISON:  None. FINDINGS: MRI HEAD FINDINGS Brain: No acute infarct. Redemonstrated complex left cerebral convexity extra-axial hemorrhage, measuring up to approximately 13 mm posteriorly (series 17, image 6). Areas of T1 hyperintensity within the collection correlate with areas of hyperdensity seen on recent CT head and are compatible with recent hemorrhage. There is associated local mass effect without midline shift. Basal cisterns are patent. No hydrocephalus. There is scattered T2/FLAIR hyperintensity within  the white  matter, compatible with chronic microvascular ischemic disease. T2/FLAIR hyperintensities within bilateral basal ganglia, favored to reflect dilated perivascular spaces Vascular: Flow voids are maintained at the skull base. Skull and upper cervical spine: Normal marrow signal. Sinuses/Orbits: Mild ethmoid air cell mucosal thickening. No air-fluid levels. No acute orbital abnormality. Other: Moderate right and small left mastoid effusions. MRA HEAD FINDINGS Anterior circulation: Petrous and cavernous carotids are patent. There is mild left greater than right paraclinoid ICA narrowing. Bilateral M1 and proximal M2 MCAs are patent. Bilateral A1 and A2 ACA is are patent. No evidence of aneurysm. No specific evidence of vascular malformation or aneurysm. Posterior circulation. There is mild narrowing of the right V4 vertebral artery. The basilar artery is patent without evidence of significant stenosis. Bilateral posterior cerebral arteries are patent without significant stenosis or occlusion. No specific evidence of vascular malformation or aneurysm. MRA NECK FINDINGS Great vessel origins are patent. No evidence of significant (greater than 50%) arterial stenosis or occlusion within the neck. Mild narrowing at bilateral carotid bifurcations, likely related to atherosclerosis without evidence of greater than 50% narrowing of the common carotid arteries or internal carotid arteries. Mildly left dominant vertebral artery. There is mild irregularity of bilateral V3 vertebral arteries as well as bilateral cervical ICAs. IMPRESSION: 1. No acute infarct. 2. Redemonstrated complex left cerebral convexity extra-axial hemorrhage, measuring up to approximately 13 mm posteriorly. Areas of T1 hyperintensity within the collection correlate with areas of hyperdensity seen on recent CT head and are compatible with recent hemorrhage. There is associated local mass effect without midline shift. 3. No evidence of significant (greater than  50%) arterial stenosis or occlusion within the head or neck. 4. Mild irregularity of bilateral V3 vertebral arteries as well as bilateral cervical ICAs, which may relate to atherosclerosis or fibromuscular dysplasia. Electronically Signed   By: Feliberto HartsFrederick S Jones MD   On: 06/25/2020 16:41   EEG adult  Result Date: 06/26/2020 Charlsie QuestYadav, Priyanka O, MD     06/26/2020 12:05 PM Patient Name: Leeroy BockMarjorie B Tucholski MRN: [id number] Epilepsy Attending: Charlsie QuestPriyanka O Yadav Referring Physician/Provider: Dr Katha CabalVondra Vassie Kugel Date: 06/26/2020 Duration: 25.53 minutes Patient history: 86 year old female with paroxysmal events of cold sensation in her right arm and alteration of awareness in the setting of nontraumatic extra-axial hematoma.  EEG to evaluate for seizures. Level of alertness: Awake, drowsy AEDs during EEG study: Keppra Technical aspects: This EEG study was done with scalp electrodes positioned according to the 10-20 International system of electrode placement. Electrical activity was acquired at a sampling rate of 500Hz  and reviewed with a high frequency filter of 70Hz  and a low frequency filter of 1Hz . EEG data were recorded continuously and digitally stored. Description: The posterior dominant rhythm consists of 8 Hz activity of moderate voltage (25-35 uV) seen predominantly in posterior head regions, symmetric and reactive to eye opening and eye closing. Drowsiness was characterized by attenuation of the posterior background rhythm. EEG showed intermittent 2 to 3 Hz delta slowing in left temporal region. Physiologic photic driving was not seen during photic stimulation.  Hyperventilation was not performed.   ABNORMALITY -Intermittent slow, left temporal region IMPRESSION: This study is suggestive of nonspecific cortical dysfunction in left temporal region.  No seizures or epileptiform discharges were seen throughout the recording. Priyanka Annabelle Harman Yadav   Overnight EEG with video  Result Date: 06/27/2020 Charlsie QuestYadav, Priyanka O, MD      06/28/2020 10:57 AM Patient Name: Leeroy BockMarjorie B Hyder MRN: [id number] Epilepsy Attending: Charlsie QuestPriyanka O Yadav Referring Physician/Provider: Onalee Huaavid  [place], [place] Duration: 06/26/2020 1542 to 06/27/2020 0112 , 06/27/2020 730 to 2100  Patient history: 86 year old female with paroxysmal events of cold sensation in her right arm and alteration of awareness in the setting of nontraumatic extra-axial hematoma.  EEG to evaluate for seizures.  Level of alertness: Awake  AEDs during EEG study: Keppra  Technical aspects: This EEG study was done with scalp electrodes positioned according to the 10-20 International system of electrode placement. Electrical activity was acquired at a sampling rate of 500Hz  and reviewed with a high frequency filter of 70Hz  and a low frequency filter of 1Hz . EEG data were recorded continuously and digitally stored.  Description: The posterior dominant rhythm consists of 8 Hz activity of moderate voltage (25-35 uV) seen predominantly in posterior head regions, symmetric and reactive to eye opening and eye closing. EEG showed intermittent 2 to 3 Hz delta slowing in left temporal region. Event button was pressed on 06/27/2020 at 1031. Patient was reported to have right sided numbness and not feeling right. Event button was again pressed on 06/27/2020 at 1226 and patient was noted to have right-sided numbness as well as right facial droop. Concomitant EEG during the event showed rhythmic 2 to 3 Hz delta slowing in left hemisphere. Of note, parts of study were missing between 10 5 112 AM to 730 AM due to technical issues.  ABNORMALITY -Intermittent slow, left temporal region -Lateralized rhythmic delta activity, left hemisphere  IMPRESSION: This study is suggestive of nonspecific cortical dysfunction in left temporal region.   Patient event button was pressed twice 06/27/1020 for right-sided numbness. Concomitant EEG showed rhythmic delta activity in left hemisphere. Lateralized rhythmic delta activity is on  the ictal-interictal continuum and in this patient's case given the temporal relationship with patient's symptoms, it is most likely ictal and consistent with focal seizure. Dr. 08/27/2020 was notified. Priyanka 08/27/2020     Results/Tests Pending at Time of Discharge: None  Discharge Medications:  Allergies as of 06/29/2020      Reactions   Scallops [shellfish Allergy] Hives, Shortness Of Breath   Sulfa Antibiotics Rash      Medication List    STOP taking these medications   ramipril 10 MG capsule Commonly known as: ALTACE     TAKE these medications   latanoprost 0.005 % ophthalmic solution Commonly known as: XALATAN Place 1 drop into both eyes at bedtime.   levETIRAcetam 1000 MG tablet Commonly known as: KEPPRA Take 1 tablet (1,000 mg total) by mouth 2 (two) times daily.   metoprolol succinate 50 MG 24 hr tablet Commonly known as: TOPROL-XL Take 50 mg by mouth daily. Take with or immediately following a meal.   PreserVision AREDS Caps Take 1 capsule by mouth daily.       Discharge Instructions: Please refer to Patient Instructions section of EMR for full details.  Patient was counseled important signs and symptoms that should prompt return to medical care, changes in medications, dietary instructions, activity restrictions, and follow up appointments.   Follow-Up Appointments:  Follow-up Information    [facility] Follow [facility]   Specialty: Rehabilitation Why: The outpatient therapy will contact you for the first appointment Contact information: [address] [facility] [place] [address] [id number] [phone]       [facility]. Schedule an appointment as soon as possible for a visit in 8 week(s).   Contact information: [address]     [address] [place] [phone] [address] [phone]  [facility]. Schedule an appointment as soon as  possible for a visit in 2 week(s).   Specialty: Neurosurgery Contact information: [address] [place] [address] [place] [address] [phone]        [facility]. Go on 07/05/2020.   Specialty: Family Medicine Why: Dr. Dareen Piano at 1:50 pm  Contact information: [address] [id number] mc [address] [place] [address] [phone]              Katha Cabal, DO 06/30/2020, 7:49 AM

## 2020-06-26 NOTE — Progress Notes (Addendum)
NEUROLOGY PROGRESS NOTE  Subjective: Patient at this point has no complaints.  Again explained the side effects of Keppra. She was fully aware and okay with using this medication.  Exam: Vitals:   06/26/20 0346 06/26/20 0816  BP: 109/61 122/61  Pulse: 75 72  Resp: 18 18  Temp: 98 F (36.7 C) 98.2 F (36.8 C)  SpO2: 98% 99%    Neuro:  Mental Status: Alert, oriented, thought content appropriate.  Speech fluent without evidence of aphasia.  Able to follow 3 step commands without difficulty. Cranial Nerves: II:  Visual fields grossly normal,  III,IV, VI: ptosis not present, extra-ocular motions intact bilaterally pupils equal, round, reactive to light and accommodation V,VII: smile symmetric, facial light touch sensation normal bilaterally VIII: hearing normal bilaterally XII: midline tongue extension Motor: Right : Upper extremity   5/5    Left:     Upper extremity   5/5  Lower extremity   5/5     Lower extremity   5/5 Tone and bulk:normal tone throughout; no atrophy noted Sensory: Pinprick and light touch intact throughout, bilaterally Deep Tendon Reflexes: 2+ and symmetric throughout   Medications:  Scheduled: . latanoprost  1 drop Both Eyes QHS  . metoprolol succinate  50 mg Oral Daily  . ramipril  10 mg Oral Daily  . sodium chloride flush  3 mL Intravenous Once   Continuous: . levETIRAcetam      Pertinent Labs/Diagnostics: MRI brain/MRA head/MRA neck (10/3): 1. No acute infarct. 2. Redemonstrated complex left cerebral convexity extra-axial hemorrhage, measuring up to approximately 13 mm posteriorly. Areas of T1 hyperintensity within the collection correlate with areas of hyperdensity seen on recent CT head and are compatible with recent hemorrhage. There is associated local mass effect without midline shift. 3. No evidence of significant (greater than 50%) arterial stenosis or occlusion within the head or neck. 4. Mild irregularity of bilateral V3 vertebral  arteries as well as bilateral cervical ICAs, which may relate to atherosclerosis or fibromuscular dysplasia.  Felicie Morn PA-C [facility] [phone]  Assessment: 86 year old female with paroxysmal events of cold sensation in her right arm and alteration of awareness in setting of non-traumatic left subdural hematoma. Though the duration of the events is not characteristic of seizure, cannot exclude postictal state or atypical seizures. Neurosurgery evaluated and felt no intervention was necessary. 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  1. She would likely benefit from trial of anti-seizure medication such as levetiracetam.  2. EEG shows intermittent slowing in the left temporal region. The study is suggestive of nonspecific cortical dysfunction in the left temporal region.  No seizures or epileptiform discharges were seen throughout the recording. 3. MRI brain/MRA head/MRA neck (10/3): Redemonstrated complex left cerebral convexity extra-axial hemorrhage, measuring up to approximately 13 mm posteriorly. Areas of T1 hyperintensity within the collection correlate with areas of hyperdensity seen on recent CT head and are compatible with recent hemorrhage. There is associated local mass effect without midline shift. No evidence of significant (greater than 50%) arterial stenosis or occlusion within the head or neck. Mild irregularity of bilateral V3 vertebral arteries as well as bilateral cervical ICAs, which may relate to atherosclerosis or fibromuscular dysplasia.   Recommendations: -- Continue levetiracetam  -- Will continue to follow.  Addendum:  -- Had another episode of right arm cold sensation however this time she had word finding difficulties and arm movement abnormalities-now back to baseline. -- Given 500 mg Keppra IV stat and increased maintenance to  750 mg BID.  -- Will also start LTM    Electronically signed: Dr. Caryl Pina 06/26/2020,  10:29 AM

## 2020-06-27 DIAGNOSIS — R569 Unspecified convulsions: Secondary | ICD-10-CM | POA: Diagnosis present

## 2020-06-27 DIAGNOSIS — G8321 Monoplegia of upper limb affecting right dominant side: Secondary | ICD-10-CM | POA: Diagnosis present

## 2020-06-27 DIAGNOSIS — I6202 Nontraumatic subacute subdural hemorrhage: Secondary | ICD-10-CM | POA: Diagnosis present

## 2020-06-27 DIAGNOSIS — Z91013 Allergy to seafood: Secondary | ICD-10-CM | POA: Diagnosis not present

## 2020-06-27 DIAGNOSIS — I10 Essential (primary) hypertension: Secondary | ICD-10-CM | POA: Diagnosis present

## 2020-06-27 DIAGNOSIS — I959 Hypotension, unspecified: Secondary | ICD-10-CM | POA: Diagnosis not present

## 2020-06-27 DIAGNOSIS — R29810 Facial weakness: Secondary | ICD-10-CM | POA: Diagnosis present

## 2020-06-27 DIAGNOSIS — S065X9A Traumatic subdural hemorrhage with loss of consciousness of unspecified duration, initial encounter: Secondary | ICD-10-CM | POA: Diagnosis not present

## 2020-06-27 DIAGNOSIS — Z20822 Contact with and (suspected) exposure to covid-19: Secondary | ICD-10-CM | POA: Diagnosis present

## 2020-06-27 DIAGNOSIS — R4781 Slurred speech: Secondary | ICD-10-CM | POA: Diagnosis present

## 2020-06-27 DIAGNOSIS — Z882 Allergy status to sulfonamides status: Secondary | ICD-10-CM | POA: Diagnosis not present

## 2020-06-27 DIAGNOSIS — Z23 Encounter for immunization: Secondary | ICD-10-CM | POA: Diagnosis present

## 2020-06-27 LAB — BASIC METABOLIC PANEL
Anion gap: 10 (ref 5–15)
BUN: 14 mg/dL (ref 8–23)
CO2: 22 mmol/L (ref 22–32)
Calcium: 9.2 mg/dL (ref 8.9–10.3)
Chloride: 97 mmol/L — ABNORMAL LOW (ref 98–111)
Creatinine, Ser: 0.9 mg/dL (ref 0.44–1.00)
GFR calc non Af Amer: 57 mL/min — ABNORMAL LOW (ref >60)
Glucose, Bld: 106 mg/dL — ABNORMAL HIGH (ref 70–99)
Potassium: 5.1 mmol/L (ref 3.5–5.1)
Sodium: 129 mmol/L — ABNORMAL LOW (ref 135–145)

## 2020-06-27 MED ORDER — LEVETIRACETAM IN NACL 1000 MG/100ML IV SOLN
1000.0000 mg | Freq: Once | INTRAVENOUS | Status: AC
  Administered 2020-06-27: 1000 mg via INTRAVENOUS
  Filled 2020-06-27: qty 100

## 2020-06-27 MED ORDER — LEVETIRACETAM IN NACL 1000 MG/100ML IV SOLN
1000.0000 mg | Freq: Two times a day (BID) | INTRAVENOUS | Status: DC
  Administered 2020-06-27 – 2020-06-29 (×4): 1000 mg via INTRAVENOUS
  Filled 2020-06-27 (×4): qty 100

## 2020-06-27 NOTE — Procedures (Addendum)
Patient Name: Nichole Cox  MRN: [id number]  Epilepsy Attending: Charlsie Quest  Referring Physician/Provider: Felicie Morn, PA Duration: 06/26/2020 1542 to 06/27/2020 0112 , 06/27/2020 730 to 2100  Patient history: 86 year old female with paroxysmal events of cold sensation in her right arm and alteration of awareness in the setting of nontraumatic extra-axial hematoma.  EEG to evaluate for seizures.  Level of alertness: Awake  AEDs during EEG study: Keppra  Technical aspects: This EEG study was done with scalp electrodes positioned according to the 10-20 International system of electrode placement. Electrical activity was acquired at a sampling rate of 500Hz  and reviewed with a high frequency filter of 70Hz  and a low frequency filter of 1Hz . EEG data were recorded continuously and digitally stored.   Description: The posterior dominant rhythm consists of 8 Hz activity of moderate voltage (25-35 uV) seen predominantly in posterior head regions, symmetric and reactive to eye opening and eye closing. EEG showed intermittent 2 to 3 Hz delta slowing in left temporal region.  Event button was pressed on 06/27/2020 at 1031. Patient was reported to have right sided numbness and not feeling right. Event button was again pressed on 06/27/2020 at 1226 and patient was noted to have right-sided numbness as well as right facial droop. Concomitant EEG during the event showed rhythmic 2 to 3 Hz delta slowing in left hemisphere.   Of note, parts of study were missing between 10 5 112 AM to 730 AM due to technical issues.  ABNORMALITY -Intermittent slow, left temporal region -Lateralized rhythmic delta activity, left hemisphere  IMPRESSION: This study is suggestive of nonspecific cortical dysfunction in left temporal region.   Patient event button was pressed twice 06/27/1020 for right-sided numbness. Concomitant EEG showed rhythmic delta activity in left hemisphere. Lateralized rhythmic delta  activity is on the ictal-interictal continuum and in this patient's case given the temporal relationship with patient's symptoms, it is most likely ictal and consistent with focal seizure.  Dr. 08/27/2020 was notified.  Nichole Cox 08/27/2020

## 2020-06-27 NOTE — Progress Notes (Signed)
Occupational Therapy Treatment Patient Details Name: Nichole Cox MRN: [id number] DOB: 04-26-1929 Today's Date: 06/27/2020    History of present illness Patient is an 86 year old female who presents with 4 episodes of R UE weakness and numbness along with facial droop and slurred speech since 06/24/20. Head CT and MRI negative for acute infarct but "evidence of left cerebral hemorrhage/SDH up to 13 mm without significant midline shift", per MD note. 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 Medical hx consisting of HTN and kidney stones.   OT comments  Pt with continuous EEG running. Pt self reports having two events since EEG attached. Pt with normal functional use of R UE and normal speech this session. Pt able to complete basic transfer without changes. Recommendation for Outpt continues to be appropriate.    Follow Up Recommendations  Outpatient OT    Equipment Recommendations  None recommended by OT    Recommendations for Other Services      Precautions / Restrictions Precautions Precautions: Fall Precaution Comments: EEG continous at this time       Mobility Bed Mobility Overal bed mobility: Needs Assistance Bed Mobility: Supine to Sit;Sit to Supine     Supine to sit: Min guard Sit to supine: Min guard   General bed mobility comments: (A) due to eeg leads  Transfers Overall transfer level: Needs assistance   Transfers: Sit to/from Stand Sit to Stand: Supervision         General transfer comment: able to have increased awareness to lines this session    Balance                                           ADL either performed or assessed with clinical judgement   ADL                                               Vision       Perception     Praxis      Cognition Arousal/Alertness: Awake/alert Behavior During Therapy: WFL for tasks assessed/performed Overall  Cognitive Status: Within Functional Limits for tasks assessed                                 General Comments: able to recall from Occupational therapy and tell daughter.         Exercises     Shoulder Instructions       General Comments      Pertinent Vitals/ Pain       Pain Assessment: No/denies pain  Home Living                                          Prior Functioning/Environment              Frequency  Min 2X/week        Progress Toward Goals  OT Goals(current goals can now be found in the care plan section)  Progress towards OT goals: Progressing toward goals  Acute Rehab OT Goals Patient Stated Goal: to go home and  assist her husband OT Goal Formulation: With patient Time For Goal Achievement: 07/10/20 Potential to Achieve Goals: Good ADL Goals Pt Will Perform Grooming: with modified independence;standing Pt Will Perform Upper Body Bathing: with modified independence;standing Pt Will Perform Lower Body Bathing: with modified independence;sit to/from stand Pt Will Perform Tub/Shower Transfer: Shower transfer;with modified independence;ambulating;shower seat Additional ADL Goal #1: pt will complete 5 step pathfinding task exiting the room mod I with written instructions provided  Plan Discharge plan remains appropriate    Co-evaluation                 AM-PAC OT "6 Clicks" Daily Activity     Outcome Measure   Help from another person eating meals?: A Little Help from another person taking care of personal grooming?: A Little Help from another person toileting, which includes using toliet, bedpan, or urinal?: A Little Help from another person bathing (including washing, rinsing, drying)?: A Little Help from another person to put on and taking off regular upper body clothing?: A Little Help from another person to put on and taking off regular lower body clothing?: A Little 6 Click Score: 18    End of Session     OT Visit Diagnosis: Ataxia, unspecified (R27.0)   Activity Tolerance Patient tolerated treatment well   Patient Left in bed;with call bell/phone within reach;with bed alarm set;with family/visitor present (daughter present entire session)   Nurse Communication Mobility status;Precautions        Time: 1440-1510 OT Time Calculation (min): 30 min  Charges: OT General Charges $OT Visit: 1 Visit OT Treatments $Therapeutic Activity: 8-22 mins   Brynn, OTR/L  Acute Rehabilitation Services Pager: [phone] Office: [phone] .    Mateo Flow 06/27/2020, 4:24 PM

## 2020-06-27 NOTE — Progress Notes (Addendum)
NEUROLOGY PROGRESS NOTE  Subjective: Patient is in her room.  She does notice that she is having some difficulty getting her words out.  She also feels as though there is a significant delay in her thought process.  Exam: Vitals:   06/27/20 1100 06/27/20 1148  BP: (!) 96/51 (!) 99/59  Pulse: 82 81  Resp: 14 14  Temp:  97.7 F (36.5 C)  SpO2: 98% 97%    Neuro:  Mental Status: Alert, oriented, thought content appropriate.  Dysarthric with slow thought processing.  Able to follow  commands without difficulty. Cranial Nerves: II:  Visual fields grossly normal, PERRL III,IV, VI: ptosis not present, EOMI V,VII: Right facial droop, facial light touch sensation normal bilaterally VIII: hearing normal bilaterally Motor: Right : Upper extremity   5/5    Left:     Upper extremity   5/5  Lower extremity   5/5     Lower extremity   5/5 Tone and bulk:normal tone throughout; no atrophy noted   Medications:  Scheduled: . latanoprost  1 drop Both Eyes QHS  . metoprolol succinate  50 mg Oral Daily    Pertinent Labs/Diagnostics:  EEG adult: Result Date: 06/26/2020 IMPRESSION: This study is suggestive of nonspecific cortical dysfunction in left temporal region.  No seizures or epileptiform discharges were seen throughout the recording. Priyanka Lahoma Rocker PA-C [facility] [phone]  Assessment: 86 year old female with paroxysmal events of cold sensation in her right arm and alteration of awareness in setting of non-traumatic left subdural hematoma. Though the duration of the events is not characteristic of seizure, cannot exclude postictal state or atypical seizures. Neurosurgery evaluated and felt no intervention was necessary. 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  1. Patient was started on Keppra which was increased from 500 to 750 BID. Due to recurrent event today, it has been increased to 1000 mg twice daily (see below) 2. EEG  shows intermittent slowing in the left temporal region. The study issuggestive of nonspecific cortical dysfunction in the left temporal region.No seizures or epileptiform discharges were seen throughout the recording. The patient had another spell at about 1045 this morning.  There is EEG correlate of left-sided slowing-of note, focal sensory seizure may not have extensive changes on scalp EEG.  The co-occurrence of the symptoms along with the focal left-sided slowing contralateral to the symptoms militate fairly strongly in favor of continued seizures as the etiology for the patient's recurrent symptoms.  Supplemental Keppra load of 1000 mg IV has been ordered and the scheduled dosage regimen has been increased to 1000 mg twice daily.  As noted above while in the room patient was having right facial droop and difficulty speaking.  Button was pushed at that point in time. 3. MRI brain/MRA head/MRA neck (10/3): Redemonstrated complex left cerebral convexity extra-axial hemorrhage, measuring up to approximately 13 mm posteriorly. Areas of T1 hyperintensity within the collection correlate with areas of hyperdensity seen on recent CT head and are compatible with recent hemorrhage. There is associated local mass effect without midline shift. No evidence of significant (greater than 50%) arterial stenosis or occlusion within the head or neck. Mild irregularity of bilateral V3 vertebral arteries as well as bilateral cervical ICAs, which may relate to atherosclerosis or fibromuscular dysplasia.   Recommendations: -- Due to episode this morning 1000 mg load of Keppra was given and Keppra was increased to 1000 mg twice daily -- Will continue to follow patient and LTM  readings. -- Neurosurgery is reconsidering hematoma evacuation given seizure recurrence today  Electronically signed: Dr. Caryl Pina 06/27/2020, 12:59 PM

## 2020-06-27 NOTE — Progress Notes (Signed)
PT Cancellation Note  Patient Details Name: Nichole Cox MRN: [id number] DOB: 30-May-1929   Cancelled Treatment:    Reason Eval/Treat Not Completed: Medical issues which prohibited therapy holding PT treatment today as pt on continuous EEG and limited with mobility. Has been getting up and going to bathroom with nursing. Also noted to have another spell this AM concerning for seizures. Will follow up once off continuous EEG in order to progress pt with mobility.    Blake Divine A Jamahl Lemmons 06/27/2020, 11:43 AM [place], PT, DPT Acute Rehabilitation Services Pager [phone] Office [phone]

## 2020-06-27 NOTE — Progress Notes (Signed)
LTM maint complete - no skin breakdown under:  Fp2, F4, M2. CZ fixed

## 2020-06-27 NOTE — Significant Event (Signed)
The patient had another spell at about 1045 this morning.  There is EEG correlate of left-sided slowing-of note, focal sensory seizure may not have extensive changes on scalp EEG.  The co-occurrence of the symptoms along with the focal left-sided slowing contralateral to the symptoms militate fairly strongly in favor of continued seizures as the etiology for the patient's recurrent symptoms.  Supplemental Keppra load of 1000 mg IV has been ordered and the scheduled dosage regimen has been increased to 1000 mg twice daily.  Electronically signed: Dr. Caryl Pina

## 2020-06-27 NOTE — Progress Notes (Addendum)
Family Medicine Teaching Service Daily Progress Note Intern Pager: [phone]  Patient name: Nichole Cox Medical record number: [id number] Date of birth: [birth year] Age: 86 y.o. Gender: female  Primary Care Provider: Patient, No Pcp Per Consultants: Neurology, Neurosurgery Code Status: Full  Pt Overview and Major Events to Date:  Patient admitted 10/3  Assessment and Plan:  Nichole Cox is an 86 year old female with a PMH significant for HTN that presented to the hospital on 10/3 with episodic right arm weakness.  Subdural hematoma, stable Patient had a total of 4 episodes of right arm weakness, a "cold" sensation, and slurred speech that started on 10/2. CT head and MRI/MRA showed evidence of a left cerebral hemorrhage up to 13 mm exerting a local mass effect without significant midline shift. Neurology and neurosurgery consulted, appreciate all recommendations. Repeat CT head demonstrated no evolution of the SDH. Neurosurgery notes SDH not significant enough to require surgical intervention and signed off. Most recent episodes most likely represent seizure activity d/t irritation of the meninges. Neurology recommended starting patient on Keppra 500 mg BID and EEG. Patient had another episode of right arm weakness and "cold" sensation with trouble finding words yesterday afternoon. Due to this, neurology increased her maintenance dose of Keppra to 750 mg BID and started LTM EEG. Most recent labs on 10/4 notable for Na 131, K 3.8, Cr 1.11, and glucose 111. - Keppra 750 mg BID - LTM EEG pending - Home with PT/OT - AM BMP tomorrow  HTN, stable Patient initially hypertensive at 168/81. Home medications include metoprolol succinate 50 mg qd and ramipril 10 mg qd. Patient currently normotensive at 114/64 on home medications. - Metoprolol succinate 50 mg qd - Ramipril 10 mg qd  FEN/GI: Regular PPx: SCDs  Disposition: Home pending LTM EEG and neurology clearance.  Subjective:   No acute overnight events. Patient evaluated at the bedside this morning. Patient was sleeping prior to exam. She reports not sleeping well overnight. She says that overnight she needed to urinate and called her nurse, but no one came to help her and she urinated in her bed. She denies any repeat right arm symptoms since she has been on EEG monitoring. She understands her treatment plan and is eager to be discharged.  Objective: Temp:  [97.8 F (36.6 C)-98.7 F (37.1 C)] 98 F (36.7 C) (10/05 0414) Pulse Rate:  [72-87] 85 (10/05 0414) Resp:  [14-18] 14 (10/05 0414) BP: (95-122)/(58-82) 114/64 (10/05 0414) SpO2:  [99 %-100 %] 99 % (10/05 0414) Physical Exam: General:  Patient is an elderly female lying supine in bed, resting comfortably, in no apparent distress.  Cardiovascular:  Normal S1 and S2 with no murmurs, rubs, or gallops. Regular rate and rhythm. Distal pulses are 2+ and symmetrical Respiratory:  CTA in all fields with no wheezing, rales, or rhonchi. Chest rise is symmetrical with no increased labor of breathing. Abdomen:  Soft, non distended, non tender to palpation. Bowel sounds normoactive. Extremities:  No lower extremity edema. Neurological: Alert and oriented x4. CN II-XII intact. Strength 5/5 in upper and lower extremities bilaterally.  Laboratory: Recent Labs  Lab 06/25/20 1232 06/26/20 0253  WBC 5.4 5.6  HGB 12.8 12.5  HCT 38.4 36.2  PLT 318 296   Recent Labs  Lab 06/25/20 1232 06/26/20 0253  NA 129* 131*  K 4.3 3.8  CL 97* 97*  CO2 20* 28  BUN 13 13  CREATININE 0.99 1.11*  CALCIUM 9.8 9.3  PROT 7.1  --  BILITOT 1.3*  --   ALKPHOS 51  --   ALT 15  --   AST 21  --   GLUCOSE 103* 111*    Imaging/Diagnostic Tests: CT HEAD WO CONTRAST  Result Date: 06/26/2020 CLINICAL DATA:  Follow-up intracranial hemorrhage. EXAM: CT HEAD WITHOUT CONTRAST TECHNIQUE: Contiguous axial images were obtained from the base of the skull through the vertex without  intravenous contrast. COMPARISON:  06/25/2020 head CT and MRI FINDINGS: Brain: A largely intermediate attenuation subdural hematoma over the left cerebral convexity is unchanged in size with maximal thickness of 16 mm in the parietal region. Mild mass effect is unchanged, and there is no significant midline shift. No new intracranial hemorrhage, acute infarct, or mass is identified. There is mild cerebral atrophy. Patchy hypodensities in the cerebral white matter bilaterally are unchanged and nonspecific but compatible with moderate chronic small vessel ischemic disease. 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 Vascular: Calcified atherosclerosis at the skull base. No hyperdense vessel. Skull: No fracture or suspicious osseous lesion. Sinuses/Orbits: Visualized paranasal sinuses and mastoid air cells are clear. Bilateral cataract extraction. Other: None. IMPRESSION: 1. Unchanged left cerebral convexity subdural hematoma. 2. No evidence of new intracranial abnormality. 3. Moderate chronic small vessel ischemic disease. Electronically Signed   By: Sebastian Ache M.D.   On: 06/26/2020 13:21   CT HEAD WO CONTRAST  Result Date: 06/25/2020 CLINICAL DATA:  TIA.  Numbness and weakness. EXAM: CT HEAD WITHOUT CONTRAST TECHNIQUE: Contiguous axial images were obtained from the base of the skull through the vertex without intravenous contrast. COMPARISON:  None. FINDINGS: Brain: There is a complex/loculated left cerebral convexity extra-axial collection, which is largely intermediate density with layering areas of hyperdensity. This collection measures up to 1.5 cm posteriorly (see series 5, image 48). There is resulting largely local mass effect with out substantial midline shift. Basal cisterns are patent. There is is patchy white matter hypoattenuation involving the white matter, which likely represents chronic microvascular ischemic disease. Focal hypodensity in the left basal ganglia,  likely remote lacunar infarct or dilated perivascular space. Vascular: Calcific atherosclerosis. Skull: Normal. Negative for fracture or focal lesion. Sinuses/Orbits: No acute finding. Other: No mastoid effusions. IMPRESSION: 1. Complex/loculated left cerebral convexity extra-axial fluid collection measuring up to 1.5 cm posteriorly. While largely intermediate density, there are layering areas of hyperdensity which suggest recent/acute blood products. This collection exerts largely local mass effect without significant midline shift or basilar cistern effacement. 2. There is extensive white matter hypoattenuation which is favored to reflect the sequela of chronic microvascular ischemic disease. No definite evidence of acute large vascular territory infarct; however, a small white matter infarct could easily be obscured and there are no priors for comparison. MRI could better evaluate for acute infarct if clinically indicated. Findingds discussed Dr. Pilar Plate at 1:14 pm via telephone Electronically Signed   By: Feliberto Harts MD   On: 06/25/2020 13:14   MR ANGIO HEAD WO CONTRAST  Result Date: 06/25/2020 CLINICAL DATA:  Four episodes of right upper arm weakness EXAM: MRI HEAD WITHOUT CONTRAST MRA HEAD WITHOUT CONTRAST MRA NECK WITHOUT AND WITH CONTRAST TECHNIQUE: Multiplanar, multiecho pulse sequences of the brain and surrounding structures were obtained without intravenous contrast. Angiographic images of the Circle of Willis were obtained using MRA technique without intravenous contrast. Angiographic images of the neck were obtained using MRA technique without and with intravenous contrast. Carotid stenosis measurements (when applicable) are obtained utilizing NASCET criteria, using the distal internal carotid diameter  as the denominator. COMPARISON:  None. FINDINGS: MRI HEAD FINDINGS Brain: No acute infarct. Redemonstrated complex left cerebral convexity extra-axial hemorrhage, measuring up to approximately 13  mm posteriorly (series 17, image 6). Areas of T1 hyperintensity within the collection correlate with areas of hyperdensity seen on recent CT head and are compatible with recent hemorrhage. There is associated local mass effect without midline shift. Basal cisterns are patent. No hydrocephalus. There is scattered T2/FLAIR hyperintensity within the white matter, compatible with chronic microvascular ischemic disease. T2/FLAIR hyperintensities within bilateral basal ganglia, favored to reflect dilated perivascular spaces Vascular: Flow voids are maintained at the skull base. Skull and upper cervical spine: Normal marrow signal. Sinuses/Orbits: Mild ethmoid air cell mucosal thickening. No air-fluid levels. No acute orbital abnormality. Other: Moderate right and small left mastoid effusions. MRA HEAD FINDINGS Anterior circulation: Petrous and cavernous carotids are patent. There is mild left greater than right paraclinoid ICA narrowing. Bilateral M1 and proximal M2 MCAs are patent. Bilateral A1 and A2 ACA is are patent. No evidence of aneurysm. No specific evidence of vascular malformation or aneurysm. Posterior circulation. There is mild narrowing of the right V4 vertebral artery. The basilar artery is patent without evidence of significant stenosis. Bilateral posterior cerebral arteries are patent without significant stenosis or occlusion. No specific evidence of vascular malformation or aneurysm. MRA NECK FINDINGS Great vessel origins are patent. No evidence of significant (greater than 50%) arterial stenosis or occlusion within the neck. Mild narrowing at bilateral carotid bifurcations, likely related to atherosclerosis without evidence of greater than 50% narrowing of the common carotid arteries or internal carotid arteries. Mildly left dominant vertebral artery. There is mild irregularity of bilateral V3 vertebral arteries as well as bilateral cervical ICAs. IMPRESSION: 1. No acute infarct. 2. Redemonstrated  complex left cerebral convexity extra-axial hemorrhage, measuring up to approximately 13 mm posteriorly. Areas of T1 hyperintensity within the collection correlate with areas of hyperdensity seen on recent CT head and are compatible with recent hemorrhage. There is associated local mass effect without midline shift. 3. No evidence of significant (greater than 50%) arterial stenosis or occlusion within the head or neck. 4. Mild irregularity of bilateral V3 vertebral arteries as well as bilateral cervical ICAs, which may relate to atherosclerosis or fibromuscular dysplasia. Electronically Signed   By: Feliberto Harts MD   On: 06/25/2020 16:41   MR Angiogram Neck W or Wo Contrast  Result Date: 06/25/2020 CLINICAL DATA:  Four episodes of right upper arm weakness EXAM: MRI HEAD WITHOUT CONTRAST MRA HEAD WITHOUT CONTRAST MRA NECK WITHOUT AND WITH CONTRAST TECHNIQUE: Multiplanar, multiecho pulse sequences of the brain and surrounding structures were obtained without intravenous contrast. Angiographic images of the Circle of Willis were obtained using MRA technique without intravenous contrast. Angiographic images of the neck were obtained using MRA technique without and with intravenous contrast. Carotid stenosis measurements (when applicable) are obtained utilizing NASCET criteria, using the distal internal carotid diameter as the denominator. COMPARISON:  None. FINDINGS: MRI HEAD FINDINGS Brain: No acute infarct. Redemonstrated complex left cerebral convexity extra-axial hemorrhage, measuring up to approximately 13 mm posteriorly (series 17, image 6). Areas of T1 hyperintensity within the collection correlate with areas of hyperdensity seen on recent CT head and are compatible with recent hemorrhage. There is associated local mass effect without midline shift. Basal cisterns are patent. No hydrocephalus. There is scattered T2/FLAIR hyperintensity within the white matter, compatible with chronic microvascular  ischemic disease. T2/FLAIR hyperintensities within bilateral basal ganglia, favored to reflect dilated perivascular spaces Vascular:  Flow voids are maintained at the skull base. Skull and upper cervical spine: Normal marrow signal. Sinuses/Orbits: Mild ethmoid air cell mucosal thickening. No air-fluid levels. No acute orbital abnormality. Other: Moderate right and small left mastoid effusions. MRA HEAD FINDINGS Anterior circulation: Petrous and cavernous carotids are patent. There is mild left greater than right paraclinoid ICA narrowing. Bilateral M1 and proximal M2 MCAs are patent. Bilateral A1 and A2 ACA is are patent. No evidence of aneurysm. No specific evidence of vascular malformation or aneurysm. Posterior circulation. There is mild narrowing of the right V4 vertebral artery. The basilar artery is patent without evidence of significant stenosis. Bilateral posterior cerebral arteries are patent without significant stenosis or occlusion. No specific evidence of vascular malformation or aneurysm. MRA NECK FINDINGS Great vessel origins are patent. No evidence of significant (greater than 50%) arterial stenosis or occlusion within the neck. Mild narrowing at bilateral carotid bifurcations, likely related to atherosclerosis without evidence of greater than 50% narrowing of the common carotid arteries or internal carotid arteries. Mildly left dominant vertebral artery. There is mild irregularity of bilateral V3 vertebral arteries as well as bilateral cervical ICAs. IMPRESSION: 1. No acute infarct. 2. Redemonstrated complex left cerebral convexity extra-axial hemorrhage, measuring up to approximately 13 mm posteriorly. Areas of T1 hyperintensity within the collection correlate with areas of hyperdensity seen on recent CT head and are compatible with recent hemorrhage. There is associated local mass effect without midline shift. 3. No evidence of significant (greater than 50%) arterial stenosis or occlusion within  the head or neck. 4. Mild irregularity of bilateral V3 vertebral arteries as well as bilateral cervical ICAs, which may relate to atherosclerosis or fibromuscular dysplasia. Electronically Signed   By: Feliberto Harts MD   On: 06/25/2020 16:41   MR BRAIN WO CONTRAST  Result Date: 06/25/2020 CLINICAL DATA:  Four episodes of right upper arm weakness EXAM: MRI HEAD WITHOUT CONTRAST MRA HEAD WITHOUT CONTRAST MRA NECK WITHOUT AND WITH CONTRAST TECHNIQUE: Multiplanar, multiecho pulse sequences of the brain and surrounding structures were obtained without intravenous contrast. Angiographic images of the Circle of Willis were obtained using MRA technique without intravenous contrast. Angiographic images of the neck were obtained using MRA technique without and with intravenous contrast. Carotid stenosis measurements (when applicable) are obtained utilizing NASCET criteria, using the distal internal carotid diameter as the denominator. COMPARISON:  None. FINDINGS: MRI HEAD FINDINGS Brain: No acute infarct. Redemonstrated complex left cerebral convexity extra-axial hemorrhage, measuring up to approximately 13 mm posteriorly (series 17, image 6). Areas of T1 hyperintensity within the collection correlate with areas of hyperdensity seen on recent CT head and are compatible with recent hemorrhage. There is associated local mass effect without midline shift. Basal cisterns are patent. No hydrocephalus. There is scattered T2/FLAIR hyperintensity within the white matter, compatible with chronic microvascular ischemic disease. T2/FLAIR hyperintensities within bilateral basal ganglia, favored to reflect dilated perivascular spaces Vascular: Flow voids are maintained at the skull base. Skull and upper cervical spine: Normal marrow signal. Sinuses/Orbits: Mild ethmoid air cell mucosal thickening. No air-fluid levels. No acute orbital abnormality. Other: Moderate right and small left mastoid effusions. MRA HEAD FINDINGS Anterior  circulation: Petrous and cavernous carotids are patent. There is mild left greater than right paraclinoid ICA narrowing. Bilateral M1 and proximal M2 MCAs are patent. Bilateral A1 and A2 ACA is are patent. No evidence of aneurysm. No specific evidence of vascular malformation or aneurysm. Posterior circulation. There is mild narrowing of the right V4 vertebral artery. The basilar artery  is patent without evidence of significant stenosis. Bilateral posterior cerebral arteries are patent without significant stenosis or occlusion. No specific evidence of vascular malformation or aneurysm. MRA NECK FINDINGS Great vessel origins are patent. No evidence of significant (greater than 50%) arterial stenosis or occlusion within the neck. Mild narrowing at bilateral carotid bifurcations, likely related to atherosclerosis without evidence of greater than 50% narrowing of the common carotid arteries or internal carotid arteries. Mildly left dominant vertebral artery. There is mild irregularity of bilateral V3 vertebral arteries as well as bilateral cervical ICAs. IMPRESSION: 1. No acute infarct. 2. Redemonstrated complex left cerebral convexity extra-axial hemorrhage, measuring up to approximately 13 mm posteriorly. Areas of T1 hyperintensity within the collection correlate with areas of hyperdensity seen on recent CT head and are compatible with recent hemorrhage. There is associated local mass effect without midline shift. 3. No evidence of significant (greater than 50%) arterial stenosis or occlusion within the head or neck. 4. Mild irregularity of bilateral V3 vertebral arteries as well as bilateral cervical ICAs, which may relate to atherosclerosis or fibromuscular dysplasia. Electronically Signed   By: Feliberto HartsFrederick S Jones MD   On: 06/25/2020 16:41   EEG adult  Result Date: 06/26/2020 Charlsie QuestYadav, Priyanka O, MD     06/26/2020 12:05 PM Patient Name: Leeroy BockMarjorie B Frysinger MRN: [id number] Epilepsy Attending: Charlsie QuestPriyanka O Yadav  Referring Physician/Provider: Dr Katha CabalVondra Brimage Date: 06/26/2020 Duration: 25.53 minutes Patient history: 86 year old female with paroxysmal events of cold sensation in her right arm and alteration of awareness in the setting of nontraumatic extra-axial hematoma.  EEG to evaluate for seizures. Level of alertness: Awake, drowsy AEDs during EEG study: Keppra Technical aspects: This EEG study was done with scalp electrodes positioned according to the 10-20 International system of electrode placement. Electrical activity was acquired at a sampling rate of 500Hz  and reviewed with a high frequency filter of 70Hz  and a low frequency filter of 1Hz . EEG data were recorded continuously and digitally stored. Description: The posterior dominant rhythm consists of 8 Hz activity of moderate voltage (25-35 uV) seen predominantly in posterior head regions, symmetric and reactive to eye opening and eye closing. Drowsiness was characterized by attenuation of the posterior background rhythm. EEG showed intermittent 2 to 3 Hz delta slowing in left temporal region. Physiologic photic driving was not seen during photic stimulation.  Hyperventilation was not performed.   ABNORMALITY -Intermittent slow, left temporal region IMPRESSION: This study is suggestive of nonspecific cortical dysfunction in left temporal region.  No seizures or epileptiform discharges were seen throughout the recording. Priyanka Domenick Bookbinder Yadav     Weaver, Jake T, Medical Student 06/27/2020, 7:26 AM FPTS Intern pager: [phone], text pages welcome  Resident Addendum I have separately seen and examined the patient.  I have discussed the findings and exam with the student and agree with the above note.  I helped develop the management plan that is described in the student's note and I agree with the content.    Physical exam:  BP (!) 99/59 (BP Location: Left Arm) Comment: rn notified  Pulse 81   Temp 97.7 F (36.5 C) (Oral)   Resp 14   SpO2 97%  Gen: alert,  oriented.  No acute distress.  CV: RRR. No murmurs Pulm: lctab. No wheezes or crackles.  Neuro: CN 2-12 grossly intact.  5/5 strength in upper and lower extremities b/l.  Normal senssation in the arms b/l.     Lenor Coffinaniel Ky Rumple, MD PGY-3 [facility]

## 2020-06-27 NOTE — Progress Notes (Signed)
  NEUROSURGERY PROGRESS NOTE   Chart reviewed. Patient with continued seizures, keppra increased again by Neurology. Could conceivably take patient to OR for crani for evacuation of the SDH if seizures are unable to be well controlled with medication. Will continue to follow.

## 2020-06-28 LAB — BASIC METABOLIC PANEL
Anion gap: 11 (ref 5–15)
BUN: 16 mg/dL (ref 8–23)
CO2: 20 mmol/L — ABNORMAL LOW (ref 22–32)
Calcium: 8.9 mg/dL (ref 8.9–10.3)
Chloride: 101 mmol/L (ref 98–111)
Creatinine, Ser: 0.9 mg/dL (ref 0.44–1.00)
GFR calc non Af Amer: 57 mL/min — ABNORMAL LOW (ref >60)
Glucose, Bld: 106 mg/dL — ABNORMAL HIGH (ref 70–99)
Potassium: 4.5 mmol/L (ref 3.5–5.1)
Sodium: 132 mmol/L — ABNORMAL LOW (ref 135–145)

## 2020-06-28 NOTE — Progress Notes (Signed)
Occupational Therapy Treatment Patient Details Name: Nichole Cox MRN: [id number] DOB: 1929-01-18 Today's Date: 06/28/2020    History of present illness Patient is an 86 year old female who presents with 4 episodes of R UE weakness and numbness along with facial droop and slurred speech since 06/24/20. Head CT and MRI negative for acute infarct but "evidence of left cerebral hemorrhage/SDH up to 13 mm without significant midline shift", per MD note. 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 Medical hx consisting of HTN and kidney stones.   OT comments  Pt demonstrates MOD I level for adls. All education is complete and patient indicates understanding. Pt expressed understanding. Please reorder if needed    Follow Up Recommendations  No OT follow up    Equipment Recommendations  None recommended by OT    Recommendations for Other Services      Precautions / Restrictions Precautions Precautions: Fall Precaution Comments: seizure       Mobility Bed Mobility               General bed mobility comments: oob on arrival in chair reading on IPad  Transfers Overall transfer level: Modified independent                    Balance                                           ADL either performed or assessed with clinical judgement   ADL Overall ADL's : Modified independent                                       General ADL Comments: toilet transfer, sink transfer shoulder transfer, objects off floor, 3 step commands. pt greets OT by saying hey you are back to do Occupational therapy!      Vision       Perception     Praxis      Cognition Arousal/Alertness: Awake/alert Behavior During Therapy: WFL for tasks assessed/performed Overall Cognitive Status: Within Functional Limits for tasks assessed                                          Exercises     Shoulder  Instructions       General Comments      Pertinent Vitals/ Pain       Pain Assessment: No/denies pain  Home Living                                          Prior Functioning/Environment              Frequency  Min 2X/week        Progress Toward Goals  OT Goals(current goals can now be found in the care plan section)  Progress towards OT goals: Progressing toward goals  Acute Rehab OT Goals Patient Stated Goal: to go home soon she hopes OT Goal Formulation: With patient Time For Goal Achievement: 07/10/20 Potential to Achieve Goals: Good ADL Goals Pt Will Perform Grooming: with modified independence;standing Pt  Will Perform Upper Body Bathing: with modified independence;standing Pt Will Perform Lower Body Bathing: with modified independence;sit to/from stand Pt Will Perform Tub/Shower Transfer: Shower transfer;with modified independence;ambulating;shower seat Additional ADL Goal #1: pt will complete 5 step pathfinding task exiting the room mod I with written instructions provided  Plan Discharge plan remains appropriate    Co-evaluation                 AM-PAC OT "6 Clicks" Daily Activity     Outcome Measure   Help from another person eating meals?: None Help from another person taking care of personal grooming?: None Help from another person toileting, which includes using toliet, bedpan, or urinal?: None Help from another person bathing (including washing, rinsing, drying)?: None Help from another person to put on and taking off regular upper body clothing?: None Help from another person to put on and taking off regular lower body clothing?: None 6 Click Score: 24    End of Session    OT Visit Diagnosis: Ataxia, unspecified (R27.0)   Activity Tolerance Patient tolerated treatment well   Patient Left in chair;with call bell/phone within reach   Nurse Communication Mobility status;Precautions        Time: 6644-0347 OT Time  Calculation (min): 13 min  Charges: OT General Charges $OT Visit: 1 Visit OT Treatments $Self Care/Home Management : 8-22 mins   Brynn, OTR/L  Acute Rehabilitation Services Pager: [phone] Office: [phone] .    Mateo Flow 06/28/2020, 4:40 PM

## 2020-06-28 NOTE — Progress Notes (Signed)
Physical Therapy Treatment Patient Details Name: Nichole Cox MRN: [id number] DOB: January 11, 1929 Today's Date: 06/28/2020    History of Present Illness Patient is an 86 year old female who presents with 4 episodes of R UE weakness and numbness along with facial droop and slurred speech since 06/24/20. Head CT and MRI negative for acute infarct but "evidence of left cerebral hemorrhage/SDH up to 13 mm without significant midline shift", per MD note. 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 Medical hx consisting of HTN and kidney stones.    PT Comments    Pt is progressing towards her goals. She does display occasional lateral trunk sway that can place her at risk for falls. Her DGI this date was 20/24 and she displayed difficulty maintaining her balance for > 3 seconds in tandem stance with no UE support. The patient would benefit from outpatient PT services upon d/c to address her balance deficits to inc her safety and dec her risk for falls. Pt educated on HEP consisting of 3-way hip exercise and heel raises with UE support on a steady surface. Pt educated on being aware of any decline in balance and possible future need for rollator for safety. Will continue to follow-up with pt to provide acute PT services to address her deficits to inc her independence and safety with all functional mobility.    Follow Up Recommendations  Outpatient PT;Supervision for mobility/OOB     Equipment Recommendations  None recommended by PT    Recommendations for Other Services       Precautions / Restrictions Precautions Precautions: Fall Precaution Comments: seizure Restrictions Weight Bearing Restrictions: No    Mobility  Bed Mobility Overal bed mobility: Modified Independent Bed Mobility: Supine to Sit     Supine to sit: Modified independent (Device/Increase time);HOB elevated     General bed mobility comments: Quickly and safely able to transition  supine > sit EOB with HOB elevated.  Transfers Overall transfer level: Modified independent Equipment used: None Transfers: Sit to/from Stand Sit to Stand: Supervision         General transfer comment: Able to come to stand in appropriate amount of time without unsteadiness, but required cues for awareness of lines with mobility.  Ambulation/Gait Ambulation/Gait assistance: Min guard Gait Distance (Feet): 450 Feet Assistive device: None Gait Pattern/deviations: Step-through pattern (lateral trunk sways on occasion)   Gait velocity interpretation: >2.62 ft/sec, indicative of community ambulatory General Gait Details: Performed DGI with results of 20/24. Displayed unsteadiness primary with tight turns or steping over obstacles. Slight path deviation with horizontal head turns.    Stairs Stairs: Yes Stairs assistance: Min guard Stair Management: Two rails;Alternating pattern Number of Stairs: 8 General stair comments: Ascending and descending with B hand rails with reciprocal pattern in appropriate amount of time, no LOB noted.   Wheelchair Mobility    Modified Rankin (Stroke Patients Only) Modified Rankin (Stroke Patients Only) Pre-Morbid Rankin Score: No significant disability Modified Rankin: No significant disability     Balance Overall balance assessment: Mild deficits observed, not formally tested (occasional trunk sway with mobility)                               Standardized Balance Assessment Standardized Balance Assessment : Dynamic Gait Index   Dynamic Gait Index Level Surface: Normal Change in Gait Speed: Normal Gait with Horizontal Head Turns: Mild Impairment Gait with Vertical Head Turns: Normal Gait and  Pivot Turn: Normal Step Over Obstacle: Mild Impairment Step Around Obstacles: Mild Impairment Steps: Mild Impairment Total Score: 20      Cognition Arousal/Alertness: Awake/alert Behavior During Therapy: WFL for tasks  assessed/performed Overall Cognitive Status: Within Functional Limits for tasks assessed                                 General Comments: Pt does report occasional memory deficits. Noted difficulty with word finding on occasion.      Exercises General Exercises - Lower Extremity Hip ABduction/ADduction: AROM;Both;10 reps;Standing (with B UE support on sink) Hip Flexion/Marching: AROM;Both;10 reps;Standing (with B UE support on sink; knees extended) Heel Raises: Strengthening;Both;10 reps;Standing (B simultaneous heel raises with B UE support on sink)    General Comments General comments (skin integrity, edema, etc.): Pt able to maintain balance standing without UE support and min guard for >/= 30 seconds in the following positions: feet together with eyes closed and open and semi-tandem stance with eyes open. Pt only able to maintain balance in tandem stance for up to ~3 seconds.       Pertinent Vitals/Pain Pain Assessment: No/denies pain Pain Intervention(s): Monitored during session    Home Living                      Prior Function            PT Goals (current goals can now be found in the care plan section) Acute Rehab PT Goals Patient Stated Goal: to go home soon she hopes PT Goal Formulation: With patient Time For Goal Achievement: 07/03/20 Potential to Achieve Goals: Good Progress towards PT goals: Progressing toward goals    Frequency    Min 4X/week      PT Plan Current plan remains appropriate    Co-evaluation              AM-PAC PT "6 Clicks" Mobility   Outcome Measure  Help needed turning from your back to your side while in a flat bed without using bedrails?: None Help needed moving from lying on your back to sitting on the side of a flat bed without using bedrails?: None Help needed moving to and from a bed to a chair (including a wheelchair)?: None Help needed standing up from a chair using your arms (e.g., wheelchair or  bedside chair)?: None Help needed to walk in hospital room?: A Little Help needed climbing 3-5 steps with a railing? : A Little 6 Click Score: 22    End of Session Equipment Utilized During Treatment: Gait belt Activity Tolerance: Patient tolerated treatment well Patient left: in chair;with call bell/phone within reach   PT Visit Diagnosis: Unsteadiness on feet (R26.81);Other abnormalities of gait and mobility (R26.89)     Time: 1345-1420 PT Time Calculation (min) (ACUTE ONLY): 35 min  Charges:  $Gait Training: 8-22 mins $Therapeutic Exercise: 8-22 mins                     Raymond Gurney, PT, DPT Acute Rehabilitation Services  Pager: [phone] Office: [phone]    Nichole Cox 06/28/2020, 5:09 PM

## 2020-06-28 NOTE — Progress Notes (Addendum)
Family Medicine Teaching Service Daily Progress Note Intern Pager: [phone]  Patient name: Nichole Cox Medical record number: [id number] Date of birth: 30-Aug-1929 Age: 86 y.o. Gender: female  Primary Care Provider: Patient, No Pcp Per Consultants: Neurology, Neurosurgery Code Status: Full  Pt Overview and Major Events to Date:  Patient admitted 10/3  Assessment and Plan:  Nichole Cox is an 86 year old female with a PMH significant for HTN that presented to the hospital on 10/3 with episodic right arm weakness, slurred speech, and a "cold" sensation across entire right arm.  Subdural hematoma, stable Patient had a total of 4 episodes of right arm weakness, a "cold" sensation, and slurred speech that started on 10/2. CT head and MRI/MRA showed evidence of a left subdural hematoma up to 13 mm exerting a local mass effect without significant midline shift. Neurology and neurosurgery consulted, appreciate all recommendations. Repeat CT head demonstrated no evolution of the SDH. Neurosurgery following closely for possible evacuation of SDH if seizures not well controlled on Keppra. Patient once again experienced the same right arm symptoms yesterday afternoon. These symptoms correlated with EEG findings that showed left-sided slowing which may be indicative of a focal sensory seizure. Her Keppra was increased to 1000 mg BID and she remained on LTM EEG monitoring. - Keppra 1000 mg BID - Home with outpatient PT/OT  Hypotension w/ hx of hypertension Patient initially hypertensive at 168/81. Home medications include metoprolol succinate 50 mg qd and ramipril 10 mg qd. Patient became hypotensive, as low as 89/49, yesterday morning after her home medications had been restarted. Ramipril was discontinued at that point. - Continue holding home ramipril 10 mg qd - Metoprolol succinate 50 mg qd. Consider decreasing if hypotension continues  FEN/GI: Regular PPx: SCDs  Disposition: Home  pending LTM EEG and neurology clearance.  Subjective:  No acute overnight events. Patient evaluated at the bedside this morning. She reports that she slept well overnight with no issues. She denies any right arm symptoms since yesterday morning. She has no complaints with her increased dose of Keppra. She denies any concerns and is eager to be discharged from the hospital.   Objective: Temp:  [97.7 F (36.5 C)-98.1 F (36.7 C)] 98.1 F (36.7 C) (10/06 0357) Pulse Rate:  [72-84] 78 (10/06 0357) Resp:  [13-17] 17 (10/06 0357) BP: (89-125)/(49-60) 90/49 (10/06 0357) SpO2:  [97 %-100 %] 99 % (10/06 0357) Physical Exam: General:  Patient is an elderly female lying on her right side in bed, resting comfortably, in no apparent distress. Cardiovascular:  Normal S1 and S2 with no murmurs, rubs, or gallops. Regular rate and rhythm. Distal pulses 2+ and symmetrical. Respiratory:  CTA in all fields with no wheezing, rales, or rhonchi. Chest rise is symmetrical with no increased labor of breathing. Abdomen:  Non distended, non tender to palpation.  Extremities:  No lower extremity edema. Neurological: Alert and oriented x4. Normal mentation.  Laboratory: Recent Labs  Lab 06/25/20 1232 06/26/20 0253  WBC 5.4 5.6  HGB 12.8 12.5  HCT 38.4 36.2  PLT 318 296   Recent Labs  Lab 06/25/20 1232 06/25/20 1232 06/26/20 0253 06/27/20 0908 06/28/20 0410  NA 129*   < > 131* 129* 132*  K 4.3   < > 3.8 5.1 4.5  CL 97*   < > 97* 97* 101  CO2 20*   < > 28 22 20*  BUN 13   < > 13 14 16   CREATININE 0.99   < > 1.11*  0.90 0.90  CALCIUM 9.8   < > 9.3 9.2 8.9  PROT 7.1  --   --   --   --   BILITOT 1.3*  --   --   --   --   ALKPHOS 51  --   --   --   --   ALT 15  --   --   --   --   AST 21  --   --   --   --   GLUCOSE 103*   < > 111* 106* 106*   < > = values in this interval not displayed.    Imaging/Diagnostic Tests: CT HEAD WO CONTRAST  Result Date: 06/26/2020 CLINICAL DATA:  Follow-up  intracranial hemorrhage. EXAM: CT HEAD WITHOUT CONTRAST TECHNIQUE: Contiguous axial images were obtained from the base of the skull through the vertex without intravenous contrast. COMPARISON:  06/25/2020 head CT and MRI FINDINGS: Brain: A largely intermediate attenuation subdural hematoma over the left cerebral convexity is unchanged in size with maximal thickness of 16 mm in the parietal region. Mild mass effect is unchanged, and there is no significant midline shift. No new intracranial hemorrhage, acute infarct, or mass is identified. There is mild cerebral atrophy. Patchy hypodensities in the cerebral white matter bilaterally are unchanged and nonspecific but compatible with moderate chronic small vessel ischemic disease. 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 Vascular: Calcified atherosclerosis at the skull base. No hyperdense vessel. Skull: No fracture or suspicious osseous lesion. Sinuses/Orbits: Visualized paranasal sinuses and mastoid air cells are clear. Bilateral cataract extraction. Other: None. IMPRESSION: 1. Unchanged left cerebral convexity subdural hematoma. 2. No evidence of new intracranial abnormality. 3. Moderate chronic small vessel ischemic disease. Electronically Signed   By: Sebastian Ache M.D.   On: 06/26/2020 13:21   CT HEAD WO CONTRAST  Result Date: 06/25/2020 CLINICAL DATA:  TIA.  Numbness and weakness. EXAM: CT HEAD WITHOUT CONTRAST TECHNIQUE: Contiguous axial images were obtained from the base of the skull through the vertex without intravenous contrast. COMPARISON:  None. FINDINGS: Brain: There is a complex/loculated left cerebral convexity extra-axial collection, which is largely intermediate density with layering areas of hyperdensity. This collection measures up to 1.5 cm posteriorly (see series 5, image 48). There is resulting largely local mass effect with out substantial midline shift. Basal cisterns are patent. There is is patchy white  matter hypoattenuation involving the white matter, which likely represents chronic microvascular ischemic disease. Focal hypodensity in the left basal ganglia, likely remote lacunar infarct or dilated perivascular space. Vascular: Calcific atherosclerosis. Skull: Normal. Negative for fracture or focal lesion. Sinuses/Orbits: No acute finding. Other: No mastoid effusions. IMPRESSION: 1. Complex/loculated left cerebral convexity extra-axial fluid collection measuring up to 1.5 cm posteriorly. While largely intermediate density, there are layering areas of hyperdensity which suggest recent/acute blood products. This collection exerts largely local mass effect without significant midline shift or basilar cistern effacement. 2. There is extensive white matter hypoattenuation which is favored to reflect the sequela of chronic microvascular ischemic disease. No definite evidence of acute large vascular territory infarct; however, a small white matter infarct could easily be obscured and there are no priors for comparison. MRI could better evaluate for acute infarct if clinically indicated. Findingds discussed Dr. Pilar Plate at 1:14 pm via telephone Electronically Signed   By: Feliberto Harts MD   On: 06/25/2020 13:14   MR ANGIO HEAD WO CONTRAST  Result Date: 06/25/2020 CLINICAL DATA:  Four episodes of  right upper arm weakness EXAM: MRI HEAD WITHOUT CONTRAST MRA HEAD WITHOUT CONTRAST MRA NECK WITHOUT AND WITH CONTRAST TECHNIQUE: Multiplanar, multiecho pulse sequences of the brain and surrounding structures were obtained without intravenous contrast. Angiographic images of the Circle of Willis were obtained using MRA technique without intravenous contrast. Angiographic images of the neck were obtained using MRA technique without and with intravenous contrast. Carotid stenosis measurements (when applicable) are obtained utilizing NASCET criteria, using the distal internal carotid diameter as the denominator. COMPARISON:   None. FINDINGS: MRI HEAD FINDINGS Brain: No acute infarct. Redemonstrated complex left cerebral convexity extra-axial hemorrhage, measuring up to approximately 13 mm posteriorly (series 17, image 6). Areas of T1 hyperintensity within the collection correlate with areas of hyperdensity seen on recent CT head and are compatible with recent hemorrhage. There is associated local mass effect without midline shift. Basal cisterns are patent. No hydrocephalus. There is scattered T2/FLAIR hyperintensity within the white matter, compatible with chronic microvascular ischemic disease. T2/FLAIR hyperintensities within bilateral basal ganglia, favored to reflect dilated perivascular spaces Vascular: Flow voids are maintained at the skull base. Skull and upper cervical spine: Normal marrow signal. Sinuses/Orbits: Mild ethmoid air cell mucosal thickening. No air-fluid levels. No acute orbital abnormality. Other: Moderate right and small left mastoid effusions. MRA HEAD FINDINGS Anterior circulation: Petrous and cavernous carotids are patent. There is mild left greater than right paraclinoid ICA narrowing. Bilateral M1 and proximal M2 MCAs are patent. Bilateral A1 and A2 ACA is are patent. No evidence of aneurysm. No specific evidence of vascular malformation or aneurysm. Posterior circulation. There is mild narrowing of the right V4 vertebral artery. The basilar artery is patent without evidence of significant stenosis. Bilateral posterior cerebral arteries are patent without significant stenosis or occlusion. No specific evidence of vascular malformation or aneurysm. MRA NECK FINDINGS Great vessel origins are patent. No evidence of significant (greater than 50%) arterial stenosis or occlusion within the neck. Mild narrowing at bilateral carotid bifurcations, likely related to atherosclerosis without evidence of greater than 50% narrowing of the common carotid arteries or internal carotid arteries. Mildly left dominant vertebral  artery. There is mild irregularity of bilateral V3 vertebral arteries as well as bilateral cervical ICAs. IMPRESSION: 1. No acute infarct. 2. Redemonstrated complex left cerebral convexity extra-axial hemorrhage, measuring up to approximately 13 mm posteriorly. Areas of T1 hyperintensity within the collection correlate with areas of hyperdensity seen on recent CT head and are compatible with recent hemorrhage. There is associated local mass effect without midline shift. 3. No evidence of significant (greater than 50%) arterial stenosis or occlusion within the head or neck. 4. Mild irregularity of bilateral V3 vertebral arteries as well as bilateral cervical ICAs, which may relate to atherosclerosis or fibromuscular dysplasia. Electronically Signed   By: Feliberto Harts MD   On: 06/25/2020 16:41   MR Angiogram Neck W or Wo Contrast  Result Date: 06/25/2020 CLINICAL DATA:  Four episodes of right upper arm weakness EXAM: MRI HEAD WITHOUT CONTRAST MRA HEAD WITHOUT CONTRAST MRA NECK WITHOUT AND WITH CONTRAST TECHNIQUE: Multiplanar, multiecho pulse sequences of the brain and surrounding structures were obtained without intravenous contrast. Angiographic images of the Circle of Willis were obtained using MRA technique without intravenous contrast. Angiographic images of the neck were obtained using MRA technique without and with intravenous contrast. Carotid stenosis measurements (when applicable) are obtained utilizing NASCET criteria, using the distal internal carotid diameter as the denominator. COMPARISON:  None. FINDINGS: MRI HEAD FINDINGS Brain: No acute infarct. Redemonstrated complex left cerebral  convexity extra-axial hemorrhage, measuring up to approximately 13 mm posteriorly (series 17, image 6). Areas of T1 hyperintensity within the collection correlate with areas of hyperdensity seen on recent CT head and are compatible with recent hemorrhage. There is associated local mass effect without midline shift.  Basal cisterns are patent. No hydrocephalus. There is scattered T2/FLAIR hyperintensity within the white matter, compatible with chronic microvascular ischemic disease. T2/FLAIR hyperintensities within bilateral basal ganglia, favored to reflect dilated perivascular spaces Vascular: Flow voids are maintained at the skull base. Skull and upper cervical spine: Normal marrow signal. Sinuses/Orbits: Mild ethmoid air cell mucosal thickening. No air-fluid levels. No acute orbital abnormality. Other: Moderate right and small left mastoid effusions. MRA HEAD FINDINGS Anterior circulation: Petrous and cavernous carotids are patent. There is mild left greater than right paraclinoid ICA narrowing. Bilateral M1 and proximal M2 MCAs are patent. Bilateral A1 and A2 ACA is are patent. No evidence of aneurysm. No specific evidence of vascular malformation or aneurysm. Posterior circulation. There is mild narrowing of the right V4 vertebral artery. The basilar artery is patent without evidence of significant stenosis. Bilateral posterior cerebral arteries are patent without significant stenosis or occlusion. No specific evidence of vascular malformation or aneurysm. MRA NECK FINDINGS Great vessel origins are patent. No evidence of significant (greater than 50%) arterial stenosis or occlusion within the neck. Mild narrowing at bilateral carotid bifurcations, likely related to atherosclerosis without evidence of greater than 50% narrowing of the common carotid arteries or internal carotid arteries. Mildly left dominant vertebral artery. There is mild irregularity of bilateral V3 vertebral arteries as well as bilateral cervical ICAs. IMPRESSION: 1. No acute infarct. 2. Redemonstrated complex left cerebral convexity extra-axial hemorrhage, measuring up to approximately 13 mm posteriorly. Areas of T1 hyperintensity within the collection correlate with areas of hyperdensity seen on recent CT head and are compatible with recent hemorrhage.  There is associated local mass effect without midline shift. 3. No evidence of significant (greater than 50%) arterial stenosis or occlusion within the head or neck. 4. Mild irregularity of bilateral V3 vertebral arteries as well as bilateral cervical ICAs, which may relate to atherosclerosis or fibromuscular dysplasia. Electronically Signed   By: Feliberto Harts MD   On: 06/25/2020 16:41   MR BRAIN WO CONTRAST  Result Date: 06/25/2020 CLINICAL DATA:  Four episodes of right upper arm weakness EXAM: MRI HEAD WITHOUT CONTRAST MRA HEAD WITHOUT CONTRAST MRA NECK WITHOUT AND WITH CONTRAST TECHNIQUE: Multiplanar, multiecho pulse sequences of the brain and surrounding structures were obtained without intravenous contrast. Angiographic images of the Circle of Willis were obtained using MRA technique without intravenous contrast. Angiographic images of the neck were obtained using MRA technique without and with intravenous contrast. Carotid stenosis measurements (when applicable) are obtained utilizing NASCET criteria, using the distal internal carotid diameter as the denominator. COMPARISON:  None. FINDINGS: MRI HEAD FINDINGS Brain: No acute infarct. Redemonstrated complex left cerebral convexity extra-axial hemorrhage, measuring up to approximately 13 mm posteriorly (series 17, image 6). Areas of T1 hyperintensity within the collection correlate with areas of hyperdensity seen on recent CT head and are compatible with recent hemorrhage. There is associated local mass effect without midline shift. Basal cisterns are patent. No hydrocephalus. There is scattered T2/FLAIR hyperintensity within the white matter, compatible with chronic microvascular ischemic disease. T2/FLAIR hyperintensities within bilateral basal ganglia, favored to reflect dilated perivascular spaces Vascular: Flow voids are maintained at the skull base. Skull and upper cervical spine: Normal marrow signal. Sinuses/Orbits: Mild ethmoid air cell mucosal  thickening. No air-fluid levels. No acute orbital abnormality. Other: Moderate right and small left mastoid effusions. MRA HEAD FINDINGS Anterior circulation: Petrous and cavernous carotids are patent. There is mild left greater than right paraclinoid ICA narrowing. Bilateral M1 and proximal M2 MCAs are patent. Bilateral A1 and A2 ACA is are patent. No evidence of aneurysm. No specific evidence of vascular malformation or aneurysm. Posterior circulation. There is mild narrowing of the right V4 vertebral artery. The basilar artery is patent without evidence of significant stenosis. Bilateral posterior cerebral arteries are patent without significant stenosis or occlusion. No specific evidence of vascular malformation or aneurysm. MRA NECK FINDINGS Great vessel origins are patent. No evidence of significant (greater than 50%) arterial stenosis or occlusion within the neck. Mild narrowing at bilateral carotid bifurcations, likely related to atherosclerosis without evidence of greater than 50% narrowing of the common carotid arteries or internal carotid arteries. Mildly left dominant vertebral artery. There is mild irregularity of bilateral V3 vertebral arteries as well as bilateral cervical ICAs. IMPRESSION: 1. No acute infarct. 2. Redemonstrated complex left cerebral convexity extra-axial hemorrhage, measuring up to approximately 13 mm posteriorly. Areas of T1 hyperintensity within the collection correlate with areas of hyperdensity seen on recent CT head and are compatible with recent hemorrhage. There is associated local mass effect without midline shift. 3. No evidence of significant (greater than 50%) arterial stenosis or occlusion within the head or neck. 4. Mild irregularity of bilateral V3 vertebral arteries as well as bilateral cervical ICAs, which may relate to atherosclerosis or fibromuscular dysplasia. Electronically Signed   By: Feliberto HartsFrederick S Jones MD   On: 06/25/2020 16:41   EEG adult  Result Date:  06/26/2020 Charlsie QuestYadav, Priyanka O, MD     06/26/2020 12:05 PM Patient Name: Leeroy BockMarjorie B Mastandrea MRN: [id number] Epilepsy Attending: Charlsie QuestPriyanka O Yadav Referring Physician/Provider: Dr Katha CabalVondra Brimage Date: 06/26/2020 Duration: 25.53 minutes Patient history: 86 year old female with paroxysmal events of cold sensation in her right arm and alteration of awareness in the setting of nontraumatic extra-axial hematoma.  EEG to evaluate for seizures. Level of alertness: Awake, drowsy AEDs during EEG study: Keppra Technical aspects: This EEG study was done with scalp electrodes positioned according to the 10-20 International system of electrode placement. Electrical activity was acquired at a sampling rate of 500Hz  and reviewed with a high frequency filter of 70Hz  and a low frequency filter of 1Hz . EEG data were recorded continuously and digitally stored. Description: The posterior dominant rhythm consists of 8 Hz activity of moderate voltage (25-35 uV) seen predominantly in posterior head regions, symmetric and reactive to eye opening and eye closing. Drowsiness was characterized by attenuation of the posterior background rhythm. EEG showed intermittent 2 to 3 Hz delta slowing in left temporal region. Physiologic photic driving was not seen during photic stimulation.  Hyperventilation was not performed.   ABNORMALITY -Intermittent slow, left temporal region IMPRESSION: This study is suggestive of nonspecific cortical dysfunction in left temporal region.  No seizures or epileptiform discharges were seen throughout the recording. Priyanka Annabelle Harman Yadav   Overnight EEG with video  Result Date: 06/27/2020 Charlsie QuestYadav, Priyanka O, MD     06/27/2020  2:58 PM Patient Name: Leeroy BockMarjorie B Quinton MRN: [id number] Epilepsy Attending: Charlsie QuestPriyanka O Yadav Referring Physician/Provider: Felicie Mornavid Smith, PA Duration: 06/26/2020 1542 to 06/27/2020 1500  Patient history: 86 year old female with paroxysmal events of cold sensation in her right arm and alteration of  awareness in the setting of nontraumatic extra-axial hematoma.  EEG to evaluate for seizures.  Level of alertness:  Awake, drowsy  AEDs during EEG study: Keppra  Technical aspects: This EEG study was done with scalp electrodes positioned according to the 10-20 International system of electrode placement. Electrical activity was acquired at a sampling rate of  and reviewed with a high frequency filter of  and a low frequency filter of . EEG data were recorded continuously and digitally stored.  Description: The posterior dominant rhythm consists of 8 Hz activity of moderate voltage (25-35 uV) seen predominantly in posterior head regions, symmetric and reactive to eye opening and eye closing. Drowsiness was characterized by attenuation of the posterior background rhythm. EEG showed intermittent 2 to 3 Hz delta slowing in left temporal region. Event button was pressed on 06/27/2020 at 1031. Patient was reported to have right sided numbness and not feeling right. Event button was again pressed on 06/27/2020 at 1226 and patient was noted to have right-sided numbness as well as right facial droop. Concomitant EEG during the event showed rhythmic 2 to 3 Hz delta slowing in left hemisphere.  ABNORMALITY -Intermittent slow, left temporal region -Lateralized rhythmic delta activity, left hemisphere  IMPRESSION: This study is suggestive of nonspecific cortical dysfunction in left temporal region.   Patient event button was pressed twice 06/27/1020 for right-sided numbness. Concomitant EEG showed rhythmic delta activity in left hemisphere. Lateralized rhythmic delta activity is on the ictal-interictal continuum and in this patient's case given the temporal relationship with patient's symptoms, it is most likely ictal and consistent with focal seizure. Dr. Otelia Limes was notified. Priyanka Domenick Bookbinder, Medical Student 06/28/2020, 7:32 AM FPTS Intern pager: [phone], text pages welcome  Resident  Addendum I have separately seen and examined the patient.  I have discussed the findings and exam with the student and agree with the above note.  I helped develop the management plan that is described in the student's note and I agree with the content.  Changes have been made in BLUE.    Lenor Coffin, MD PGY-2 [facility] residency program

## 2020-06-28 NOTE — Plan of Care (Signed)

## 2020-06-28 NOTE — Progress Notes (Signed)
vLTM EEG complete. Skin breakdown at electrode site F4.

## 2020-06-28 NOTE — Procedures (Addendum)
Patient Name:Nichole Cox KPV:[id number] Epilepsy Attending:Venecia Mehl Annabelle Harman Referring Physician/Provider:David Katrinka Blazing, [place] Duration:06/27/2020 2100 to 06/27/2020  1217  Patient history:86 year old female with paroxysmal events of cold sensation in her right arm and alteration of awareness in the setting of nontraumatic extra-axial hematoma. EEG to evaluate for seizures.  Level of alertness:Awake,asleep  AEDs during EEG study:Keppra  Technical aspects: This EEG study was done with scalp electrodes positioned according to the 10-20 International system of electrode placement. Electrical activity was acquired at a sampling rate of 500Hz  and reviewed with a high frequency filter of 70Hz  and a low frequency filter of 1Hz . EEG data were recorded continuously and digitally stored.   Description: The posterior dominant rhythm consists of8Hz  activity of moderate voltage (25-35 uV) seen predominantly in posterior head regions, symmetric and reactive to eye opening and eye closing.  Sleep was characterized by vertex waves, sleep channels (12 to 14 Hz), maximal frontocentral region.  EEG initially showed continuous 3 to 5 Hz theta and delta slowing in left hemisphere which gradually improved to intermittent 2 to 3 Hz delta slowing in left temporal region.  ABNORMALITY -Continuous slow, left hemisphere  IMPRESSION: This study issuggestive of cortical dysfunction in left hemisphere likely secondary to postictal state. No seizures or epileptiform discharges were seen during the study.  Lauretta Sallas 

## 2020-06-28 NOTE — Progress Notes (Addendum)
NEUROLOGY PROGRESS NOTE   Subjective: Patient resting comfortably in bed.  States that she does not feel she has had any further seizures.  She states that she has no side effects with the anticonvulsant medication.  Exam: Vitals:   06/28/20 0357 06/28/20 0816  BP: (!) 90/49 105/60  Pulse: 78 70  Resp: 17 18  Temp: 98.1 F (36.7 C) 98.6 F (37 C)  SpO2: 99% 98%      Neuro:  Mental Status: Alert, oriented, thought content appropriate.  Speech fluent without evidence of aphasia.  Able to follow 3 step commands without difficulty. Cranial Nerves: II:  Visual fields grossly normal,  III,IV, VI: ptosis not present, extra-ocular motions intact bilaterally pupils equal, round, reactive to light and accommodation V,VII: smile symmetric, facial light touch sensation normal bilaterally VIII: hearing normal bilaterally IX,X: Palate rises midline XI: bilateral shoulder shrug XII: midline tongue extension Motor: Moving all extremities antigravity Sensory: Pinprick and light touch intact throughout, bilaterally Cerebellar: normal finger-to-nose    Medications:  Scheduled: . latanoprost  1 drop Both Eyes QHS  . metoprolol succinate  50 mg Oral Daily   Continuous: . levETIRAcetam 1,000 mg (06/27/20 2209)   KPV:[id number] **OR** acetaminophen, polyethylene glycol  Pertinent Labs/Diagnostics:   EEG adult Result Date: 06/26/2020 Charlsie Quest, MD     06/26/2020 12:05 PM Patient Name: ANTONETTE HENDRICKS MRN: [id number] Epilepsy Attending: Charlsie Quest Referring Physician/Provider: Dr Katha Cabal Date: 06/26/2020 Duration: 25.53 minutes Patient history: 86 year old female with paroxysmal events of cold sensation in her right arm and alteration of awareness in the setting of nontraumatic extra-axial hematoma.  EEG to evaluate for seizures. Level of alertness: Awake, drowsy AEDs during EEG study: Keppra Technical aspects: This EEG study was done with scalp electrodes  positioned according to the 10-20 International system of electrode placement. Electrical activity was acquired at a sampling rate of 500Hz  and reviewed with a high frequency filter of 70Hz  and a low frequency filter of 1Hz . EEG data were recorded continuously and digitally stored. Description: The posterior dominant rhythm consists of 8 Hz activity of moderate voltage (25-35 uV) seen predominantly in posterior head regions, symmetric and reactive to eye opening and eye closing. Drowsiness was characterized by attenuation of the posterior background rhythm. EEG showed intermittent 2 to 3 Hz delta slowing in left temporal region. Physiologic photic driving was not seen during photic stimulation.  Hyperventilation was not performed.   ABNORMALITY -Intermittent slow, left temporal region IMPRESSION: This study is suggestive of nonspecific cortical dysfunction in left temporal region.  No seizures or epileptiform discharges were seen throughout the recording. Priyanka   Overnight EEG with video Result Date: 06/27/2020 , MD     06/27/2020  2:58 PM Patient Name: DEVANIE GALANTI MRN: Charlsie Quest Epilepsy Attending: 08/27/2020 Referring Physician/Provider: Leeroy Bock, PA Duration: 06/26/2020 1542 to 06/27/2020 1500  Patient history: 86 year old female with paroxysmal events of cold sensation in her right arm and alteration of awareness in the setting of nontraumatic extra-axial hematoma.  EEG to evaluate for seizures.  Level of alertness: Awake, drowsy  AEDs during EEG study: Keppra  Technical aspects: This EEG study was done with scalp electrodes positioned according to the 10-20 International system of electrode placement. Electrical activity was acquired at a sampling rate of 500Hz  and reviewed with a high frequency filter of 70Hz  and a low frequency filter of 1Hz . EEG data were recorded continuously and digitally stored.  Description: The posterior dominant rhythm consists  of 8 Hz  activity of moderate voltage (25-35 uV) seen predominantly in posterior head regions, symmetric and reactive to eye opening and eye closing. Drowsiness was characterized by attenuation of the posterior background rhythm. EEG showed intermittent 2 to 3 Hz delta slowing in left temporal region. Event button was pressed on 06/27/2020 at 1031. Patient was reported to have right sided numbness and not feeling right. Event button was again pressed on 06/27/2020 at 1226 and patient was noted to have right-sided numbness as well as right facial droop. Concomitant EEG during the event showed rhythmic 2 to 3 Hz delta slowing in left hemisphere.  ABNORMALITY -Intermittent slow, left temporal region -Lateralized rhythmic delta activity, left hemisphere  IMPRESSION: This study is suggestive of nonspecific cortical dysfunction in left temporal region.   Patient event button was pressed twice 06/27/1020 for right-sided numbness. Concomitant EEG showed rhythmic delta activity in left hemisphere. Lateralized rhythmic delta activity is on the ictal-interictal continuum and in this patient's case given the temporal relationship with patient's symptoms, it is most likely ictal and consistent with focal seizure. Dr. Otelia Limes was notified. Priyanka Lahoma Rocker PA-C [facility] [phone]  Assessment:86 year oldfemalewith paroxysmal events of cold sensation in her right arm and alteration of awareness in setting of non-traumatic left subduralhematoma. Though the duration of the eventsisnot characteristic of seizure, cannot exclude postictal state or atypical seizures. Neurosurgery evaluated and felt no intervention was necessary. 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  1. Patient was started on Keppra which was increased from 500 to 750 BID. Due to recurrent event today, it has been increased to 1000 mg twice daily (see below) 2. EEG shows intermittent slowing in  theleft temporal region. Thestudy issuggestive of nonspecific cortical dysfunction intheleft temporal region.No seizures or epileptiform discharges were seen throughout the recording. The patient had another spell at about 1045 this morning. There is EEG correlate of left-sided slowing-of note, focal sensory seizure may not have extensive changes on scalp EEG. The co-occurrence of the symptoms along with the focal left-sided slowing contralateral to the symptoms militatefairly strongly in favor of continued seizures as the etiology for the patient's recurrent symptoms. Supplemental Keppra load of 1000 mg IV has been ordered and the scheduled dosage regimen has been increased to 1000 mg twice daily.  As noted above while in the room patient was having right facial droop and difficulty speaking.  Button was pushed at that point in time.  At current time this morning 06/28/2020 patient was not showing any active seizure activity.  Still hooked up to LTM. 3. MRI brain/MRA head/MRA neck (10/3):Redemonstrated complex left cerebral convexity extra-axial hemorrhage, measuring up to approximately 13 mm posteriorly. Areas of T1 hyperintensity within the collection correlate with areas of hyperdensity seen on recent CT head and are compatible with recent hemorrhage. There is associated local mass effect without midline shift. No evidence of significant (greater than 50%) arterial stenosis or occlusion within the head or neck. Mild irregularity of bilateral V3 vertebral arteries as well as bilateral cervical ICAs, which may relate to atherosclerosis or fibromuscular dysplasia.  Recommendations: -- Continue Keppra 1000 mg twice daily --Will continue to follow patient and LTM readings.  Likely discharge LTM today -- Neurosurgery is reconsidering hematoma evacuation given seizure recurrence today  Electronically signed: Dr. Caryl Pina 06/28/2020, 9:34 AM

## 2020-06-29 DIAGNOSIS — R569 Unspecified convulsions: Secondary | ICD-10-CM

## 2020-06-29 MED ORDER — INFLUENZA VAC A&B SA ADJ QUAD 0.5 ML IM PRSY
0.5000 mL | PREFILLED_SYRINGE | INTRAMUSCULAR | Status: AC
  Administered 2020-06-29: 0.5 mL via INTRAMUSCULAR
  Filled 2020-06-29: qty 0.5

## 2020-06-29 MED ORDER — LEVETIRACETAM 500 MG PO TABS: 1000.0000 mg | ORAL_TABLET | Freq: Two times a day (BID) | ORAL | Status: DC

## 2020-06-29 MED ORDER — LEVETIRACETAM 1000 MG PO TABS: 1000.0000 mg | ORAL_TABLET | Freq: Two times a day (BID) | ORAL | 0 refills | Status: DC

## 2020-06-29 NOTE — Progress Notes (Signed)
Pt discharge education and instructions completed with pt at bedside. Pt voices understanding and denies any questions. Pt IV and telemetry removed. Pt discharge home with daughter to come pick up to transport off to disposition. Pt to pick up electronically sent prescriptions from preferred pharmacy on file. Pt transported off unit via wheelchair with belongings to the side. Dionne Bucy RN

## 2020-06-29 NOTE — Progress Notes (Addendum)
Subjective: No further seizures overnight.  Patient states she has not had any further episodes of right side numbness.  ROS: negative except above  Examination  Vital signs in last 24 hours: Temp:  [97.8 F (36.6 C)-98.5 F (36.9 C)] 98.1 F (36.7 C) (10/07 0800) Pulse Rate:  [72-85] 77 (10/07 0800) Resp:  [14-18] 14 (10/07 0800) BP: (104-153)/(52-82) 123/63 (10/07 0800) SpO2:  [98 %-100 %] 100 % (10/07 0730)  General: lying in bed, not in apparent distress CVS: pulse-normal rate and rhythm RS: breathing comfortably, CTA B Extremities: normal, warm Neuro: AOx3, follows all complex commands, normal attention span, cranial nerves II to XII grossly intact, 5/5 in all 4 extremities.   Basic Metabolic Panel: Recent Labs  Lab 06/25/20 1232 06/25/20 1232 06/26/20 0253 06/27/20 0908 06/28/20 0410  NA 129*  --  131* 129* 132*  K 4.3  --  3.8 5.1 4.5  CL 97*  --  97* 97* 101  CO2 20*  --  28 22 20*  GLUCOSE 103*  --  111* 106* 106*  BUN 13  --  13 14 16   CREATININE 0.99  --  1.11* 0.90 0.90  CALCIUM 9.8   < > 9.3 9.2 8.9   < > = values in this interval not displayed.    CBC: Recent Labs  Lab 06/25/20 1232 06/26/20 0253  WBC 5.4 5.6  NEUTROABS 3.1  --   HGB 12.8 12.5  HCT 38.4 36.2  MCV 99.7 99.7  PLT 318 296     Coagulation Studies: No results for input(s): LABPROT, INR in the last 72 hours.  Imaging CT head without contrast 06/26/2020: Unchanged left cerebral convexity subdural hematoma. No evidence of new intracranial abnormality. Moderate chronic small vessel ischemic disease.   ASSESSMENT AND PLAN: 86 year old female with nontraumatic left subdural hematoma who had episodes of cold sensation in the right arm and alteration of awareness consistent with seizures which improved on Keppra 1000 mg twice daily.  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  [name] mental status and exam is back to  baseline.  Recommendations -Continue Keppra 1000 mg twice daily, will switch to p.o. -Continue seizure precautions including do not drive -Recommend follow-up with neurology in 8 to 12 weeks  Seizure precautions: Per [facility] statutes, patients with seizures are not allowed to drive until they have been seizure-free for six months and cleared by a physician    Use caution when using heavy equipment or power tools. Avoid working on ladders or at heights. Take showers instead of baths. Ensure the water temperature is not too high on the home water heater. Do not go swimming alone. Do not lock yourself in a room alone (i.e. bathroom). When caring for infants or small children, sit down when holding, feeding, or changing them to minimize risk of injury to the child in the event you have a seizure. Maintain good sleep hygiene. Avoid alcohol.    If patient has another seizure, call 911 and bring them back to the ED if: A.  The seizure lasts longer than 5 minutes.      B.  The patient doesn't wake shortly after the seizure or has new problems such as difficulty seeing, speaking or moving following the seizure C.  The patient was injured during the seizure D.  The patient has a temperature over 102 F (39C) E.  The patient vomited during the seizure and now is having trouble breathing  During the Seizure   - First, ensure adequate ventilation and place patients on the floor on their left side  Loosen clothing around the neck and ensure the airway is patent. If the patient is clenching the teeth, do not force the mouth open with any object as this can cause severe damage - Remove all items from the surrounding that can be hazardous. The patient may be oblivious to what's happening and may not even know what he or she is doing. If the patient is confused and wandering, either gently guide him/her away and block access to outside areas - Reassure the individual and be comforting - Call 911.  In most cases, the seizure ends before EMS arrives. However, there are cases when seizures may last over 3 to 5 minutes. Or the individual may have developed breathing difficulties or severe injuries. If a pregnant patient or a person with diabetes develops a seizure, it is prudent to call an ambulance. - Finally, if the patient does not regain full consciousness, then call EMS. Most patients will remain confused for about 45 to 90 minutes after a seizure, so you must use judgment in calling for help. - Avoid restraints but make sure the patient is in a bed with padded side rails - Place the individual in a lateral position with the neck slightly flexed; this will help the saliva drain from the mouth and prevent the tongue from falling backward - Remove all nearby furniture and other hazards from the area - Provide verbal assurance as the individual is regaining consciousness - Provide the patient with privacy if possible - Call for help and start treatment as ordered by the caregiver    After the Seizure (Postictal Stage)   After a seizure, most patients experience confusion, fatigue, muscle pain and/or a headache. Thus, one should permit the individual to sleep. For the next few days, reassurance is essential. Being calm and helping reorient the person is also of importance.   Most seizures are painless and end spontaneously. Seizures are not harmful to others but can lead to complications such as stress on the lungs, brain and the heart. Individuals with prior lung problems may develop labored breathing and respiratory distress.    Thank you for allowing [place] to part spent in the care of this patient.  Neurology will sign off.  Please call us if you have any further questions.   I have spent a total of  25  minutes with the patient reviewing hospital notes,  test results, labs and examining the patient as well as establishing an assessment and plan that was discussed personally with the patient  and primary team.  > 50% of time was spent in direct patient care.   Lindie Spruce Epilepsy [facility] For questions after 5pm please refer to AMION to reach the Neurologist on call

## 2020-06-29 NOTE — Progress Notes (Signed)
Physical Therapy Treatment Patient Details Name: Nichole Cox MRN: [id number] DOB: 1928/12/07 Today's Date: 06/29/2020    History of Present Illness Patient is an 86 year old female who presents with 4 episodes of R UE weakness and numbness along with facial droop and slurred speech since 06/24/20. Head CT and MRI negative for acute infarct but "evidence of left cerebral hemorrhage/SDH up to 13 mm without significant midline shift", per MD note. 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 Medical hx consisting of HTN and kidney stones.    PT Comments    Pt progressing well towards her goals. She is modified independent for all bed mobility and transfers. She requires min guard for gait and stair negotiation due to mild balance deficits. Pt is aware of her deficits and safety concerns. Recommending outpatient PT services to address these deficits to dec her risk for falls and injuries as the patient is of high functional mobility independence status and can follow through with transportation to an outpatient clinic.   Follow Up Recommendations  Outpatient PT;Supervision for mobility/OOB     Equipment Recommendations  None recommended by PT    Recommendations for Other Services       Precautions / Restrictions Precautions Precautions: Fall Precaution Comments: seizure Restrictions Weight Bearing Restrictions: No    Mobility  Bed Mobility Overal bed mobility: Modified Independent Bed Mobility: Supine to Sit     Supine to sit: Modified independent (Device/Increase time);HOB elevated     General bed mobility comments: Quickly and safely able to transition supine > sit EOB with HOB elevated.  Transfers Overall transfer level: Modified independent Equipment used: None Transfers: Sit to/from Stand Sit to Stand: Supervision         General transfer comment: Able to come to stand in appropriate amount of time without  unsteadiness.  Ambulation/Gait Ambulation/Gait assistance: Min guard Gait Distance (Feet): 200 Feet Assistive device: None Gait Pattern/deviations: Step-through pattern   Gait velocity interpretation: >2.62 ft/sec, indicative of community ambulatory General Gait Details: VC's to inc speed. No LOB noted.   Stairs Stairs: Yes Stairs assistance: Min guard Stair Management: One rail Right;One rail Left;Two rails;Alternating pattern;Step to pattern Number of Stairs: 10 General stair comments: Ascended stairs with use of L handrail to simulate home, displaying reciprocal pattern and no LOB in appropriate amount of time. Descended steps turned sideways with step-to pattern, requiring B handrails initial 6 steps then R handrail final 4, no LOB. Decreased speed descending.   Wheelchair Mobility    Modified Rankin (Stroke Patients Only)       Balance Overall balance assessment: Mild deficits observed, not formally tested                                          Cognition Arousal/Alertness: Awake/alert Behavior During Therapy: WFL for tasks assessed/performed Overall Cognitive Status: Within Functional Limits for tasks assessed                                 General Comments: Pt able to find and return to room without assistance, but required extra time to debate directions.      Exercises General Exercises - Lower Extremity Gluteal Sets: 10 reps;Supine (Bridging ) Hip ABduction/ADduction: 10 reps;Both;Sidelying Straight Leg Raises: 10 reps;Both;Supine    General Comments General comments (  skin integrity, edema, etc.): 30 second STS test = 11 reps with use of chair arms      Pertinent Vitals/Pain Pain Assessment: No/denies pain Pain Intervention(s): Monitored during session    Home Living                      Prior Function            PT Goals (current goals can now be found in the care plan section) Acute Rehab PT  Goals Patient Stated Goal: to go home PT Goal Formulation: With patient Time For Goal Achievement: 07/03/20 Potential to Achieve Goals: Good Progress towards PT goals: Progressing toward goals    Frequency    Min 4X/week      PT Plan Current plan remains appropriate    Co-evaluation              AM-PAC PT "6 Clicks" Mobility   Outcome Measure  Help needed turning from your back to your side while in a flat bed without using bedrails?: None Help needed moving from lying on your back to sitting on the side of a flat bed without using bedrails?: None Help needed moving to and from a bed to a chair (including a wheelchair)?: None Help needed standing up from a chair using your arms (e.g., wheelchair or bedside chair)?: None Help needed to walk in hospital room?: A Little Help needed climbing 3-5 steps with a railing? : A Little 6 Click Score: 22    End of Session Equipment Utilized During Treatment: Gait belt Activity Tolerance: Patient tolerated treatment well Patient left: in chair;with call bell/phone within reach   PT Visit Diagnosis: Unsteadiness on feet (R26.81);Muscle weakness (generalized) (M62.81)     Time: 8099-8338 PT Time Calculation (min) (ACUTE ONLY): 17 min  Charges:  $Gait Training: 8-22 mins                     Raymond Gurney, PT, DPT Acute Rehabilitation Services  Pager: [phone] Office: [phone]    Jewel Baize 06/29/2020, 2:14 PM

## 2020-06-29 NOTE — Progress Notes (Signed)
  NEUROSURGERY PROGRESS NOTE   No issues overnight. No seizures in >24 hours Eager for d/c home  EXAM:  BP 123/63 (BP Location: Left Arm)   Pulse 77   Temp 98.1 F (36.7 C) (Oral)   Resp 14   SpO2 100%   Awake, alert, oriented  Speech fluent, appropriate  CN grossly intact  5/5 BUE/BLE   IMPRESSION/PLAN 86 y.o. female  With presumed seizures likely secondary to left SDH. No further seizures >24 hours. No indication to proceed with SDH evacuation at this point. Hopefully patient can be discharged later today. Would like to see her in 2-3 weeks for a repeat head CT to ensure SDH is resolving. Please call for any concerns.

## 2020-06-29 NOTE — TOC Transition Note (Signed)
Transition of Care [facility]) - CM/SW Discharge Note   Patient Details  Name: Nichole Cox MRN: [id number] Date of Birth: 13-Oct-1928  [facility] of [facility] CM/SW Contact:  Kermit Balo, RN Phone Number: 06/29/2020, 2:13 PM   Clinical Narrative:    Pt is discharging home with outpatient therapy through [facility].  Pt has transportation home.    Final next level of care: OP Rehab Barriers to Discharge: No Barriers Identified   Patient Goals and CMS Choice     Choice offered to / list presented to : Patient  Discharge Placement                       Discharge Plan and Services                                     Social Determinants of Health (SDOH) Interventions     Readmission Risk Interventions No flowsheet data found.

## 2020-06-29 NOTE — Discharge Instructions (Signed)
Thank you for letting us participate in your care! In this section, you will find a brief hospital admission summary of why you were admitted to the hospital, what happened, and any further follow up we think would benefit you and your health:   You were admitted after experiencing transient right arm weakness along with a "cold" sensation throughout your arm. Your initial work up was significant for a CT scan and MRI/MRA that showed a small subdural hematoma (a bleed between your brain and you skull).   This bleed was most likely the cause of your symptoms in your arm. You were also seen by neurology and neurosurgery. Neurology ordered and EEG, a test that looks at the electrical activity in your brain. They found that your right arm symptoms correlated with abnormal patterns on the EEG. These symptoms were most likely the result of focal seizures caused by the blood around your brain. Neurology started a medication called Keppra (levteiracetam) in order to control seizures. Neurosurgery did not feel that any other treatments were necessary since your symptoms were well controlled on Keppra. After increasing the dose of this medicine, you remained symptom free for over 24 hours. You should continue taking Keppra.   POST-HOSPITAL/FOLLOW-UP CARE INSTRUCTIONS Home instructions:  1. If you experience any right arm weakness or other neurological deficits, call your neurologist and have someone bring you to the ED immediately. 2. Please stop by the pharmacy. Continue taking your Keppra (levetiracetam) 1000 mg two times per day as directed. 3. We have scheduled a hospital follow up for you at the [facility].  4. Please schedule a follow up appointment with neurosurgery in 2-3 weeks. 5. Please schedule a follow up appointment with neurology in 8-12 weeks.  Doctors appointments & follow up:   Follow-up Information    [facility] Follow up.     Specialty: Rehabilitation Why: The outpatient therapy will contact you for the first appointment Contact information: [address] [id number] [place] [address] [phone]       [facility]. Schedule an appointment as soon as possible for a visit in 8 week(s).   Contact information: [address]     [address] [place] [address] [phone]       [facility]. Schedule an appointment as soon as possible for a visit in 2 week(s).   Specialty: Neurosurgery Contact information: [address] [place] [address] [place] [address] [phone]        [facility]. Go on 07/05/2020.   Specialty: Family Medicine Why: Dr. Dareen Piano at 1:50 pm  Contact information: [address] [id number] [place] [address] [phone]               Thank you for choosing [facility]! Take care!  Family Medicine Inpatient Team   [facility]

## 2020-06-29 NOTE — Progress Notes (Addendum)
Family Medicine Teaching Service Daily Progress Note Intern Pager: [phone]  Patient name: Nichole Cox Medical record number: [id number] Date of birth: Oct 21, 1928 Age: 86 y.o. Gender: female  Primary Care Provider: Patient, No Pcp Per Consultants: Neurology, Neurosurgery Code Status: Full  Pt Overview and Major Events to Date:  Patient admitted on 10/3  Assessment and Plan:  Nichole Cox is an 86 year old female with a PMH significant for HTN that presented to the hospital with episodic right arm weakness, slurred speech, and a "cold" sensation across entire right upper extremity.    Subdural hematoma Patient had a total of 4 episodes of right arm weakness, a "cold" sensation, and slurred speech that started on 10/2. CT head and MRI/MRA showed evidence of a left subdural hematoma up to 13 mmexerting a local mass effect without significant midline shift. Neurology and neurosurgery consulted, appreciate all recommendations. Repeat CT head demonstrated no evolution of the SDH.Neurosurgery following closely for possible evacuation of SDH if seizures not well controlled on Keppra. Patient has had no seizures or recurrent right arm symptoms in the past 24-48 hours. Neurosurgery saw her this morning and does not see any indication to proceed with SDH evacuation. Patient is medically stable for discharge.  - Keppra 1000 mg BID - Home with outpatient PT/OT - Follow up with neurosurgery in 2-3 weeks for repeat CT head - Follow up with neurology in 8-12 weeks.   Hypotension with history of hypertension Patient initially hypertensive at 168/81. Home medications include metoprolol succinate 50 mg qd and ramipril 10 mg qd. Patient became hypotensive and her home ramipril was held.  Most recent blood pressure 123/63.  - Continue holding ramipril 10 mg qd - Metoprolol succinate 50 mg qd. Consider decreasing if hypotensive.  FEN/GI: Regular PPx: SCDs  Disposition: Home with  outpatient PT.  Subjective:  No acute overnight events. Patient evaluated at the bedside this morning. She reports that she slept well and has not had any recurrent episodes of her right arm symptoms over the past 24 hours. She has no concerns this morning and is eager to be discharged home.   Objective: Temp:  [97.8 F (36.6 C)-98.6 F (37 C)] 98.3 F (36.8 C) (10/07 0730) Pulse Rate:  [70-85] 72 (10/07 0730) Resp:  [16-18] 18 (10/07 0730) BP: (104-153)/(52-82) 138/77 (10/07 0730) SpO2:  [98 %-100 %] 100 % (10/07 0730) Physical Exam: General:  Patient is a pleasant elderly female lying supine in bed, resting comfortably, in no apparent distress. Cardiovascular:  Normal S1 and S2 with no murmurs, rubs, or gallops. Regular rate and rhythm. Distal pulses 2+ and symmetrical. Respiratory:  CTA in all fields with no wheezing, rales, or rhonchi. Chest rise is symmetrical with no increased labor of breathing. Abdomen:  Non distended. Non tender to palpation. Extremities:  No lower extremity edema. Neurological: Alert and oriented x4. Normal mentation. CN II-XII intact. Muscle strength 5/5 bilaterally in the upper and lower extremities. No facial droop  Laboratory: Recent Labs  Lab 06/25/20 1232 06/26/20 0253  WBC 5.4 5.6  HGB 12.8 12.5  HCT 38.4 36.2  PLT 318 296   Recent Labs  Lab 06/25/20 1232 06/25/20 1232 06/26/20 0253 06/27/20 0908 06/28/20 0410  NA 129*   < > 131* 129* 132*  K 4.3   < > 3.8 5.1 4.5  CL 97*   < > 97* 97* 101  CO2 20*   < > 28 22 20*  BUN 13   < > 13 14  16  CREATININE 0.99   < > 1.11* 0.90 0.90  CALCIUM 9.8   < > 9.3 9.2 8.9  PROT 7.1  --   --   --   --   BILITOT 1.3*  --   --   --   --   ALKPHOS 51  --   --   --   --   ALT 15  --   --   --   --   AST 21  --   --   --   --   GLUCOSE 103*   < > 111* 106* 106*   < > = values in this interval not displayed.    Imaging/Diagnostic Tests: No results found.    Stacey Drain T, Medical  Student 06/29/2020, 7:59 AM FPTS Intern pager: [phone], text pages welcome     RESIDENT ATTESTATION OF STUDENT NOTE   I personally evaluated this patient along with the student, and verified all aspects of the history, physical exam, and medical decision making as documented by the student. I agree with the student's documentation and have made all necessary edits.   Katha Cabal, DO PGY-1,  Family Medicine 06/29/2020 1:37 PM

## 2020-07-05 ENCOUNTER — Other Ambulatory Visit: Payer: Self-pay

## 2020-07-05 ENCOUNTER — Ambulatory Visit (INDEPENDENT_AMBULATORY_CARE_PROVIDER_SITE_OTHER): Payer: Medicare Other | Admitting: Family Medicine

## 2020-07-05 VITALS — BP 152/78 | HR 72 | Ht 64.5 in | Wt 139.4 lb

## 2020-07-05 DIAGNOSIS — N1831 Chronic kidney disease, stage 3a: Secondary | ICD-10-CM | POA: Diagnosis not present

## 2020-07-05 DIAGNOSIS — I10 Essential (primary) hypertension: Secondary | ICD-10-CM

## 2020-07-05 DIAGNOSIS — R569 Unspecified convulsions: Secondary | ICD-10-CM | POA: Diagnosis present

## 2020-07-05 DIAGNOSIS — S065X9A Traumatic subdural hemorrhage with loss of consciousness of unspecified duration, initial encounter: Secondary | ICD-10-CM | POA: Diagnosis not present

## 2020-07-05 DIAGNOSIS — S065XAA Traumatic subdural hemorrhage with loss of consciousness status unknown, initial encounter: Secondary | ICD-10-CM

## 2020-07-05 NOTE — Assessment & Plan Note (Signed)
Patient recently hospitalized for subdural hematoma, neurological symptoms have stabilized. -Following up CBC at today's visit

## 2020-07-05 NOTE — Assessment & Plan Note (Signed)
Blood pressure at today's visit 152/78 -Patient instructed to restart her ramipril 10 mg -Continue metoprolol 50 mg daily

## 2020-07-05 NOTE — Assessment & Plan Note (Signed)
-  Evaluating with BMP at today's visit

## 2020-07-05 NOTE — Patient Instructions (Addendum)
Thank you for coming in to see Nichole Cox today! Please see below to review our plan for today's visit:  1. Continue Metoprolol  50mg  daily and Ramipril 10mg  daily. 2. Continue Keppra 1000mg  twice daily.  3. We are checking kidney function, electrolytes, and blood work for anemias.   Please call the clinic at [phone] if your symptoms worsen or you have any concerns. It was our pleasure to serve you!    Dr. [facility]

## 2020-07-05 NOTE — Assessment & Plan Note (Addendum)
Patient presented to [facility] on 06/25/2020 with concerns for sensory changes and weakness, was found to have had subdural hematoma that was causing her to have seizures. -Continue Keppra 1000 mg daily -Patient has follow-up planned with both neurology and neurosurgery

## 2020-07-05 NOTE — Progress Notes (Signed)
° ° °  SUBJECTIVE:   CHIEF COMPLAINT / HPI:   Hospital follow-up: Patient was hospitalized from 10/3-10/7 for subdural hematoma that was triggering seizures as appreciated on EEG, which showed focal sensory seizure. Keppra was increased to 1000 mg BID. Neurosurgery considered evacuation of the SHD if seizures were not well controlled with medications. She remained seizure free for >24 hours prior to discharge. She has follow up with neurosurgery in 2-3 weeks for repeat CT head to ensure SDH is resolving and neurology in 8-12 weeks.  Hypotension with history of hypertension: Patient was initially hypertensive in the hospital at 168/81. Her home medications including metoprolol succinate 50 mg qd and ramipril 10 mg qd were started in the hospital. She become hypotensive, with blood pressures measuring as low as 89/49. Her ramipril was discontinued. She remained on her metoprolol 50 mg qd and her blood pressure stabilized.  Today patient's blood pressure is 152/78.  She reports that she took her ramipril 10 mg yesterday but not this morning.  PERTINENT  PMH / PSH:  Patient Active Problem List   Diagnosis Date Noted   Primary hypertension 07/05/2020   Stage 3a chronic kidney disease (HCC) 07/05/2020   SDH (subdural hematoma) (HCC)    Seizure (HCC) 06/25/2020    OBJECTIVE:   BP (!) 152/78    Pulse 72    Ht 5' 4.5" (1.638 m)    Wt 139 lb 6.4 oz (63.2 kg)    SpO2 97%    BMI 23.56 kg/m    Physical exam: General: Well-appearing patient, very pleasant Respiratory: CTA, comfortable work of breathing Cardio: RRR, S1-S2 present, no murmurs appreciated Abdomen: Normal bowel sounds appreciated Neuro: Cranial nerves II-XII grossly intact, no focal neurological deficits appreciated, sensation and motor function intact and symmetrical to lower extremities, normal gait   ASSESSMENT/PLAN:   Primary hypertension Blood pressure at today's visit 152/78 -Patient instructed to restart her ramipril 10  mg -Continue metoprolol 50 mg daily  Stage 3a chronic kidney disease (HCC) -Evaluating with BMP at today's visit  Seizure [facility]) Patient presented to [facility] on 06/25/2020 with concerns for sensory changes and weakness, was found to have had subdural hematoma that was causing her to have seizures. -Continue Keppra 1000 mg daily -Patient has follow-up planned with both neurology and neurosurgery  SDH (subdural hematoma) (HCC) Patient recently hospitalized for subdural hematoma, neurological symptoms have stabilized. -Following up CBC at today's visit     Dollene Cleveland, DO [facility]

## 2020-07-06 ENCOUNTER — Other Ambulatory Visit: Payer: Self-pay | Admitting: Neurosurgery

## 2020-07-06 DIAGNOSIS — S065X9A Traumatic subdural hemorrhage with loss of consciousness of unspecified duration, initial encounter: Secondary | ICD-10-CM

## 2020-07-06 DIAGNOSIS — S065XAA Traumatic subdural hemorrhage with loss of consciousness status unknown, initial encounter: Secondary | ICD-10-CM

## 2020-07-06 LAB — CBC
Hematocrit: 39.7 % (ref 34.0–46.6)
Hemoglobin: 13.2 g/dL (ref 11.1–15.9)
MCH: 32.8 pg (ref 26.6–33.0)
MCHC: 33.2 g/dL (ref 31.5–35.7)
MCV: 99 fL — ABNORMAL HIGH (ref 79–97)
Platelets: 361 10*3/uL (ref 150–450)
RBC: 4.03 x10E6/uL (ref 3.77–5.28)
RDW: 11.7 % (ref 11.7–15.4)
WBC: 5 10*3/uL (ref 3.4–10.8)

## 2020-07-06 LAB — BASIC METABOLIC PANEL
BUN/Creatinine Ratio: 15 (ref 12–28)
BUN: 14 mg/dL (ref 8–27)
CO2: 20 mmol/L (ref 20–29)
Calcium: 10.2 mg/dL (ref 8.7–10.3)
Chloride: 94 mmol/L — ABNORMAL LOW (ref 96–106)
Creatinine, Ser: 0.91 mg/dL (ref 0.57–1.00)
GFR calc Af Amer: 65 mL/min/{1.73_m2} (ref >59)
GFR calc non Af Amer: 56 mL/min/{1.73_m2} — ABNORMAL LOW (ref >59)
Glucose: 105 mg/dL — ABNORMAL HIGH (ref 65–99)
Potassium: 5.1 mmol/L (ref 3.5–5.2)
Sodium: 131 mmol/L — ABNORMAL LOW (ref 134–144)

## 2020-07-11 ENCOUNTER — Encounter: Payer: Self-pay | Admitting: Family Medicine

## 2020-07-12 ENCOUNTER — Ambulatory Visit
Admission: RE | Admit: 2020-07-12 | Discharge: 2020-07-12 | Disposition: A | Discharge status: Home / Self Care | Payer: Medicare Other | Source: Ambulatory Visit | Attending: Neurosurgery | Admitting: Neurosurgery

## 2020-07-12 DIAGNOSIS — S065X9A Traumatic subdural hemorrhage with loss of consciousness of unspecified duration, initial encounter: Secondary | ICD-10-CM

## 2020-07-12 DIAGNOSIS — S065XAA Traumatic subdural hemorrhage with loss of consciousness status unknown, initial encounter: Secondary | ICD-10-CM

## 2020-07-25 ENCOUNTER — Other Ambulatory Visit: Payer: Self-pay

## 2020-07-25 ENCOUNTER — Encounter: Payer: Self-pay | Admitting: Adult Health

## 2020-07-25 ENCOUNTER — Ambulatory Visit (INDEPENDENT_AMBULATORY_CARE_PROVIDER_SITE_OTHER): Payer: Medicare Other | Admitting: Adult Health

## 2020-07-25 VITALS — HR 82 | Temp 97.1°F | Ht 64.5 in | Wt 138.0 lb

## 2020-07-25 DIAGNOSIS — M545 Low back pain, unspecified: Secondary | ICD-10-CM

## 2020-07-25 DIAGNOSIS — K5901 Slow transit constipation: Secondary | ICD-10-CM

## 2020-07-25 MED ORDER — ACETAMINOPHEN 500 MG PO TABS: 1000.0000 mg | ORAL_TABLET | Freq: Four times a day (QID) | ORAL | 0 refills | Status: DC | PRN

## 2020-07-25 MED ORDER — POLYETHYLENE GLYCOL 3350 17 G PO PACK: 17.0000 g | PACK | Freq: Every day | ORAL | 0 refills | Status: DC

## 2020-07-25 MED ORDER — SENNOSIDES-DOCUSATE SODIUM 8.6-50 MG PO TABS: 2.0000 | ORAL_TABLET | Freq: Every day | ORAL | 0 refills | Status: DC

## 2020-07-25 NOTE — Progress Notes (Signed)
[facility] clinic  Provider:  Kenard Gower - DNP  Code Status:  Full Code  Goals of Care: No flowsheet data found.   Chief Complaint  Patient presents with  . Acute Visit    Complains of back pain and constipation, presents with daughter     HPI: Patient is a 86 y.o. female seen today for an acute visit for lower back pain and constipation. Patient presented to the clinic with daughter. They recently moved to [place] from [place]. [place] to [place]. She was recently hospitalized 06/25/20 to 06/29/20 due to a subdural hematoma with unknown etiology. She has no history of falls, head trauma or on any anticoagulation. She was started on Keppra for 2 seizure like activity which was confirmed by EEG. She is in the waiting list to move into [employer]. She has gone to the gym of [employer] 4 days ago and tried different equipments. She stated that she did leg lifts, arm pushes and pulls up and down. The next day, she started feeling bilateral lower back pains. According to patient, she has not been eating very much. The last bowel movement she had was 4 days ago. She denies abdominal pain. She has taken Colace with no effect. She has an appointment tomorrow with Dr. Bufford Spikes, who will be her new PCP, to establish care.   Past Medical History:  Diagnosis Date  . Focal seizures (HCC)    Per [facility] new patient packet/ Right hand   . Hypertension   . Kidney stone   . Macular degeneration    Per [facility] new patient packet    Past Surgical History:  Procedure Laterality Date  . CATARACT EXTRACTION  1997   Per [facility] new patient packet  . KIDNEY STONE SURGERY     Per [facility] new patient packet  . SHOULDER SURGERY  2008    Allergies  Allergen Reactions  . Scallops [Shellfish Allergy] Hives and Shortness Of Breath  . Sulfa Antibiotics Rash    Outpatient Encounter Medications as of 07/25/2020  Medication Sig    . latanoprost (XALATAN) 0.005 % ophthalmic solution Place 1 drop into both eyes at bedtime.  . levETIRAcetam (KEPPRA) 1000 MG tablet Take 1 tablet (1,000 mg total) by mouth 2 (two) times daily.  . metoprolol succinate (TOPROL-XL) 50 MG 24 hr tablet Take 50 mg by mouth daily. Take with or immediately following a meal.  . Multiple Vitamins-Minerals (PRESERVISION AREDS) CAPS Take 1 capsule by mouth daily.  . ramipril (ALTACE) 10 MG capsule Take 10 mg by mouth daily.  . polyethylene glycol (MIRALAX) 17 g packet Take 17 g by mouth daily.  Marland Kitchen senna-docusate (SENNA S) 8.6-50 MG tablet Take 2 tablets by mouth at bedtime.   No facility-administered encounter medications on file as of 07/25/2020.    Review of Systems:  Review of Systems  Constitutional: Negative for activity change, appetite change and fever.  HENT: Negative for congestion.   Respiratory: Negative for cough and shortness of breath.   Cardiovascular: Negative for leg swelling.  Gastrointestinal: Positive for constipation. Negative for abdominal distention, abdominal pain and vomiting.       Had her las BM 5 days ago  Genitourinary: Negative for difficulty urinating and dysuria.  Musculoskeletal: Positive for back pain. Negative for gait problem and joint swelling.  Skin: Negative for color change.  Neurological: Positive for seizures. Negative for tremors, speech difficulty, numbness and headaches.  Recently diagnosed with seizure and was started on Keppra  Psychiatric/Behavioral: Negative.     Health Maintenance  Topic Date Due  . TETANUS/TDAP  Never done  . DEXA SCAN  Never done  . INFLUENZA VACCINE  Completed  . COVID-19 Vaccine  Completed  . PNA vac Low Risk Adult  Completed    Physical Exam: Vitals:   07/25/20 1523  Pulse: 82  Temp: (!) 97.1 F (36.2 C)  TempSrc: Temporal  SpO2: 98%  Weight: 138 lb (62.6 kg)  Height: 5' 4.5" (1.638 m)   Body mass index is 23.32 kg/m. Physical Exam Constitutional:       Appearance: Normal appearance.  HENT:     Head: Normocephalic and atraumatic.  Cardiovascular:     Rate and Rhythm: Normal rate and regular rhythm.     Pulses: Normal pulses.     Heart sounds: Normal heart sounds.  Pulmonary:     Effort: Pulmonary effort is normal.     Breath sounds: Normal breath sounds.  Abdominal:     General: Bowel sounds are normal.     Palpations: Abdomen is soft.     Tenderness: There is no abdominal tenderness.  Musculoskeletal:        General: No swelling. Normal range of motion.     Cervical back: Normal range of motion.  Skin:    General: Skin is warm.  Neurological:     General: No focal deficit present.     Mental Status: She is alert and oriented to person, place, and time.     Gait: Gait normal.  Psychiatric:        Mood and Affect: Mood normal.        Behavior: Behavior normal.     Labs reviewed: Basic Metabolic Panel: Recent Labs    06/27/20 0908 06/28/20 0410 07/05/20 1407  NA 129* 132* 131*  K 5.1 4.5 5.1  CL 97* 101 94*  CO2 22 20* 20  GLUCOSE 106* 106* 105*  BUN 14 16 14   CREATININE 0.90 0.90 0.91  CALCIUM 9.2 8.9 10.2   Liver Function Tests: Recent Labs    06/25/20 1232  AST 21  ALT 15  ALKPHOS 51  BILITOT 1.3*  PROT 7.1  ALBUMIN 4.0    CBC: Recent Labs    06/25/20 1232 06/26/20 0253 07/05/20 1407  WBC 5.4 5.6 5.0  NEUTROABS 3.1  --   --   HGB 12.8 12.5 13.2  HCT 38.4 36.2 39.7  MCV 99.7 99.7 99*  PLT 318 296 361   Lipid Panel: Recent Labs    06/26/20 0253  CHOL 178  HDL 54  LDLCALC 109*  TRIG 75  CHOLHDL 3.3   Lab Results  Component Value Date   HGBA1C 5.6 06/26/2020    Procedures since last visit: CT HEAD WO CONTRAST  Result Date: 07/12/2020 CLINICAL DATA:  Follow-up subdural hematoma. EXAM: CT HEAD WITHOUT CONTRAST TECHNIQUE: Contiguous axial images were obtained from the base of the skull through the vertex without intravenous contrast. COMPARISON:  06/26/2020 FINDINGS: Brain: A mixed  but largely intermediate density subdural hematoma over the left cerebral convexity has decreased in size, now with a maximal thickness of 11 mm in the left parietal region. Associated mass effect on the left cerebral hemisphere has decreased. There is no midline shift. No new intracranial hemorrhage, acute infarct, or mass is identified. Hypodensities in the cerebral white matter bilaterally are unchanged and nonspecific but compatible with moderate chronic small vessel ischemic disease. A  chronic lacunar infarct is again noted in the left putamen/external capsule. Mild cerebral atrophy is within normal limits for age. Vascular: Calcified atherosclerosis at the skull base. No hyperdense vessel. Skull: No fracture or suspicious osseous lesion. Sinuses/Orbits: Visualized paranasal sinuses are clear. Small chronic right mastoid effusion. Bilateral cataract extraction. Other: None. IMPRESSION: 1. Decreased size of left-sided subdural hematoma. 2. No evidence of new intracranial abnormality. 3. Moderate chronic small vessel ischemic disease. Electronically Signed   By: Sebastian Ache M.D.   On: 07/12/2020 09:55   CT HEAD WO CONTRAST  Result Date: 06/26/2020 CLINICAL DATA:  Follow-up intracranial hemorrhage. EXAM: CT HEAD WITHOUT CONTRAST TECHNIQUE: Contiguous axial images were obtained from the base of the skull through the vertex without intravenous contrast. COMPARISON:  06/25/2020 head CT and MRI FINDINGS: Brain: A largely intermediate attenuation subdural hematoma over the left cerebral convexity is unchanged in size with maximal thickness of 16 mm in the parietal region. Mild mass effect is unchanged, and there is no significant midline shift. No new intracranial hemorrhage, acute infarct, or mass is identified. There is mild cerebral atrophy. Patchy hypodensities in the cerebral white matter bilaterally are unchanged and nonspecific but compatible with moderate chronic small vessel ischemic disease. A chronic  lacunar infarct is again seen in the left putamen/external capsule. Vascular: Calcified atherosclerosis at the skull base. No hyperdense vessel. Skull: No fracture or suspicious osseous lesion. Sinuses/Orbits: Visualized paranasal sinuses and mastoid air cells are clear. Bilateral cataract extraction. Other: None. IMPRESSION: 1. Unchanged left cerebral convexity subdural hematoma. 2. No evidence of new intracranial abnormality. 3. Moderate chronic small vessel ischemic disease. Electronically Signed   By: Sebastian Ache M.D.   On: 06/26/2020 13:21   EEG adult  Result Date: 06/26/2020 Charlsie Quest, MD     06/26/2020 12:05 PM Patient Name: LERAE LANGHAM MRN: [id number] Epilepsy Attending: Charlsie Quest Referring Physician/Provider: Dr Katha Cabal Date: 06/26/2020 Duration: 25.53 minutes Patient history: 86 year old female with paroxysmal events of cold sensation in her right arm and alteration of awareness in the setting of nontraumatic extra-axial hematoma.  EEG to evaluate for seizures. Level of alertness: Awake, drowsy AEDs during EEG study: Keppra Technical aspects: This EEG study was done with scalp electrodes positioned according to the 10-20 International system of electrode placement. Electrical activity was acquired at a sampling rate of 500Hz  and reviewed with a high frequency filter of 70Hz  and a low frequency filter of 1Hz . EEG data were recorded continuously and digitally stored. Description: The posterior dominant rhythm consists of 8 Hz activity of moderate voltage (25-35 uV) seen predominantly in posterior head regions, symmetric and reactive to eye opening and eye closing. Drowsiness was characterized by attenuation of the posterior background rhythm. EEG showed intermittent 2 to 3 Hz delta slowing in left temporal region. Physiologic photic driving was not seen during photic stimulation.  Hyperventilation was not performed.   ABNORMALITY -Intermittent slow, left temporal region  IMPRESSION: This study is suggestive of nonspecific cortical dysfunction in left temporal region.  No seizures or epileptiform discharges were seen throughout the recording. Priyanka   Overnight EEG with video  Result Date: 06/27/2020 , MD     06/28/2020 10:57 AM Patient Name: NITYA CAUTHON MRN: Charlsie Quest Epilepsy Attending: 08/28/2020 Referring Physician/Provider: Leeroy Bock, PA Duration: 06/26/2020 1542 to 06/27/2020 0112 , 06/27/2020 730 to 2100  Patient history: 86 year old female with paroxysmal events of cold sensation in her right arm and alteration of awareness in the  setting of nontraumatic extra-axial hematoma.  EEG to evaluate for seizures.  Level of alertness: Awake  AEDs during EEG study: Keppra  Technical aspects: This EEG study was done with scalp electrodes positioned according to the 10-20 International system of electrode placement. Electrical activity was acquired at a sampling rate of 500Hz  and reviewed with a high frequency filter of 70Hz  and a low frequency filter of 1Hz . EEG data were recorded continuously and digitally stored.  Description: The posterior dominant rhythm consists of 8 Hz activity of moderate voltage (25-35 uV) seen predominantly in posterior head regions, symmetric and reactive to eye opening and eye closing. EEG showed intermittent 2 to 3 Hz delta slowing in left temporal region. Event button was pressed on 06/27/2020 at 1031. Patient was reported to have right sided numbness and not feeling right. Event button was again pressed on 06/27/2020 at 1226 and patient was noted to have right-sided numbness as well as right facial droop. Concomitant EEG during the event showed rhythmic 2 to 3 Hz delta slowing in left hemisphere. Of note, parts of study were missing between 10 5 112 AM to 730 AM due to technical issues.  ABNORMALITY -Intermittent slow, left temporal region -Lateralized rhythmic delta activity, left hemisphere  IMPRESSION:  This study is suggestive of nonspecific cortical dysfunction in left temporal region.   Patient event button was pressed twice 06/27/1020 for right-sided numbness. Concomitant EEG showed rhythmic delta activity in left hemisphere. Lateralized rhythmic delta activity is on the ictal-interictal continuum and in this patient's case given the temporal relationship with patient's symptoms, it is most likely ictal and consistent with focal seizure. Dr. 08/27/2020 was notified. Priyanka 08/27/2020    Assessment/Plan  1. Acute bilateral low back pain without sciatica - Ice pack to lower back TID - acetaminophen (TYLENOL) 500 MG tablet; Take 2 tablets (1,000 mg total) by mouth every 6 (six) hours as needed.  Dispense: 30 tablet; Refill: 0 -  PT evaluation might be a good idea for back exercises  2. Slow transit constipation -  Increase fiber intake - polyethylene glycol (MIRALAX) 17 g packet; Take 17 g by mouth daily.  Dispense: 14 each; Refill: 0 - senna-docusate (SENNA S) 8.6-50 MG tablet; Take 2 tablets by mouth at bedtime.  Dispense: 60 tablet; Refill: 0    Labs/tests ordered:  None  Next appt:  07/26/2020

## 2020-07-25 NOTE — Patient Instructions (Signed)
Constipation, Adult Constipation is when a person:  Poops (has a bowel movement) fewer times in a week than normal.  Has a hard time pooping.  Has poop that is dry, hard, or bigger than normal. Follow these instructions at home: Eating and drinking   Eat foods that have a lot of fiber, such as: ? Fresh fruits and vegetables. ? Whole grains. ? Beans.  Eat less of foods that are high in fat, low in fiber, or overly processed, such as: ? [place] fries. ? Hamburgers. ? Cookies. ? Candy. ? Soda.  Drink enough fluid to keep your pee (urine) clear or pale yellow. General instructions  Exercise regularly or as told by your doctor.  Go to the restroom when you feel like you need to poop. Do not hold it in.  Take over-the-counter and prescription medicines only as told by your doctor. These include any fiber supplements.  Do pelvic floor retraining exercises, such as: ? Doing deep breathing while relaxing your lower belly (abdomen). ? Relaxing your pelvic floor while pooping.  Watch your condition for any changes.  Keep all follow-up visits as told by your doctor. This is important. Contact a doctor if:  You have pain that gets worse.  You have a fever.  You have not pooped for 4 days.  You throw up (vomit).  You are not hungry.  You lose weight.  You are bleeding from the anus.  You have thin, pencil-like poop (stool). Get help right away if:  You have a fever, and your symptoms suddenly get worse.  You leak poop or have blood in your poop.  Your belly feels hard or bigger than normal (is bloated).  You have very bad belly pain.  You feel dizzy or you faint. This information is not intended to replace advice given to you by your health care provider. Make sure you discuss any questions you have with your health care provider. Document Revised: 08/22/2017 Document Reviewed: 02/28/2016 Elsevier Patient Education  2020 Elsevier Inc.  Acute Back Pain,  Adult Acute back pain is sudden and usually short-lived. It is often caused by an injury to the muscles and tissues in the back. The injury may result from:  A muscle or ligament getting overstretched or torn (strained). Ligaments are tissues that connect bones to each other. Lifting something improperly can cause a back strain.  Wear and tear (degeneration) of the spinal disks. Spinal disks are circular tissue that provides cushioning between the bones of the spine (vertebrae).  Twisting motions, such as while playing sports or doing yard work.  A hit to the back.  Arthritis. You may have a physical exam, lab tests, and imaging tests to find the cause of your pain. Acute back pain usually goes away with rest and home care. Follow these instructions at home: Managing pain, stiffness, and swelling  Take over-the-counter and prescription medicines only as told by your health care provider.  Your health care provider may recommend applying ice during the first 24-48 hours after your pain starts. To do this: ? Put ice in a plastic bag. ? Place a towel between your skin and the bag. ? Leave the ice on for 20 minutes, 2-3 times a day.  If directed, apply heat to the affected area as often as told by your health care provider. Use the heat source that your health care provider recommends, such as a moist heat pack or a heating pad. ? Place a towel between your skin and the  heat source. ? Leave the heat on for 20-30 minutes. ? Remove the heat if your skin turns bright red. This is especially important if you are unable to feel pain, heat, or cold. You have a greater risk of getting burned. Activity   Do not stay in bed. Staying in bed for more than 1-2 days can delay your recovery.  Sit up and stand up straight. Avoid leaning forward when you sit, or hunching over when you stand. ? If you work at a desk, sit close to it so you do not need to lean over. Keep your chin tucked in. Keep your  neck drawn back, and keep your elbows bent at a right angle. Your arms should look like the letter "L." ? Sit high and close to the steering wheel when you drive. Add lower back (lumbar) support to your car seat, if needed.  Take short walks on even surfaces as soon as you are able. Try to increase the length of time you walk each day.  Do not sit, drive, or stand in one place for more than 30 minutes at a time. Sitting or standing for long periods of time can put stress on your back.  Do not drive or use heavy machinery while taking prescription pain medicine.  Use proper lifting techniques. When you bend and lift, use positions that put less stress on your back: ? [place] your knees. ? Keep the load close to your body. ? Avoid twisting.  Exercise regularly as told by your health care provider. Exercising helps your back heal faster and helps prevent back injuries by keeping muscles strong and flexible.  Work with a physical therapist to make a safe exercise program, as recommended by your health care provider. Do any exercises as told by your physical therapist. Lifestyle  Maintain a healthy weight. Extra weight puts stress on your back and makes it difficult to have good posture.  Avoid activities or situations that make you feel anxious or stressed. Stress and anxiety increase muscle tension and can make back pain worse. Learn ways to manage anxiety and stress, such as through exercise. General instructions  Sleep on a firm mattress in a comfortable position. Try lying on your side with your knees slightly bent. If you lie on your back, put a pillow under your knees.  Follow your treatment plan as told by your health care provider. This may include: ? Cognitive or behavioral therapy. ? Acupuncture or massage therapy. ? Meditation or yoga. Contact a health care provider if:  You have pain that is not relieved with rest or medicine.  You have increasing pain going down into your legs  or buttocks.  Your pain does not improve after 2 weeks.  You have pain at night.  You lose weight without trying.  You have a fever or chills. Get help right away if:  You develop new bowel or bladder control problems.  You have unusual weakness or numbness in your arms or legs.  You develop nausea or vomiting.  You develop abdominal pain.  You feel faint. Summary  Acute back pain is sudden and usually short-lived.  Use proper lifting techniques. When you bend and lift, use positions that put less stress on your back.  Take over-the-counter and prescription medicines and apply heat or ice as directed by your health care provider. This information is not intended to replace advice given to you by your health care provider. Make sure you discuss any questions you have with  your health care provider. Document Revised: 12/29/2018 Document Reviewed: 04/23/2017 Elsevier Patient Education  2020 [employer].

## 2020-07-26 ENCOUNTER — Encounter: Payer: Self-pay | Admitting: Internal Medicine

## 2020-07-26 ENCOUNTER — Non-Acute Institutional Stay: Payer: Medicare Other | Admitting: Internal Medicine

## 2020-07-26 VITALS — BP 160/92 | HR 77 | Temp 97.7°F | Ht 64.0 in | Wt 138.6 lb

## 2020-07-26 DIAGNOSIS — S39012A Strain of muscle, fascia and tendon of lower back, initial encounter: Secondary | ICD-10-CM

## 2020-07-26 DIAGNOSIS — S065X9A Traumatic subdural hemorrhage with loss of consciousness of unspecified duration, initial encounter: Secondary | ICD-10-CM

## 2020-07-26 DIAGNOSIS — R569 Unspecified convulsions: Secondary | ICD-10-CM

## 2020-07-26 DIAGNOSIS — I10 Essential (primary) hypertension: Secondary | ICD-10-CM | POA: Diagnosis not present

## 2020-07-26 DIAGNOSIS — N1831 Chronic kidney disease, stage 3a: Secondary | ICD-10-CM | POA: Diagnosis not present

## 2020-07-26 DIAGNOSIS — K5901 Slow transit constipation: Secondary | ICD-10-CM

## 2020-07-26 DIAGNOSIS — Z87442 Personal history of urinary calculi: Secondary | ICD-10-CM

## 2020-07-26 DIAGNOSIS — H3530 Unspecified macular degeneration: Secondary | ICD-10-CM

## 2020-07-26 DIAGNOSIS — S065XAA Traumatic subdural hemorrhage with loss of consciousness status unknown, initial encounter: Secondary | ICD-10-CM

## 2020-07-26 MED ORDER — PREDNISONE 10 MG (21) PO TBPK: ORAL_TABLET | ORAL | 0 refills | Status: DC

## 2020-07-26 MED ORDER — METHOCARBAMOL 500 MG PO TABS: 500.0000 mg | ORAL_TABLET | Freq: Every evening | ORAL | 0 refills | Status: DC | PRN

## 2020-07-26 NOTE — Progress Notes (Signed)
Provider:  Rexene Edison. Mariea Clonts, D.O., C.M.D. Location:  Occupational psychologist of Service:  Clinic (12)  Previous PCP: Gayland Curry, DO Patient Care Team: Gayland Curry, DO as PCP - General (Geriatric Medicine) [place], [place] as Consulting Physician (Neurosurgery) Marcial Pacas, MD as Consulting Physician (Neurology)  Extended Emergency Contact Information Primary Emergency Contact: Evrard,CECIL Address: [address]          [place], [place] [address] Johnnette Litter of [place] Phone: [phone] Relation: Spouse Secondary Emergency Contact: Melynda Keller Address: [address]          [place],  [address] Johnnette Litter of [place] Phone: [phone] Mobile Phone: [phone] Relation: Daughter  Code Status: full code at this time--thinking about Goals of Care: Advanced Directive information Advanced Directives 07/26/2020  Does Patient Have a Medical Advance Directive? Yes  Type of Paramedic of [place];Living will  Does patient want to make changes to medical advance directive? No - Patient declined  Copy of [place] in Chart? No - copy requested   Chief Complaint  Patient presents with  . Establish Care    New patient to establish care   . Health Maintenance    Dexa scan, Tetanus, shingles, may need PNA 13/and 23  . Acute Visit    complains of back pain and constipation x 1 week las BM     HPI: Patient is a 86 y.o. female seen today to establish with [facility].  Records have been requested from her previous PCP in [place] when packet submitted--apparently f/u needed b/c I don't have them.    She had pain in her back and was seen yesterday at the office.    Had an event in early October and was put on keeppra for a seizure related to a SDH.  When she was d/c'd from the hospital, she was told not to take ibuprofen.    Here with daughter,  Mickel Baas.  They had fjust moved here from [place] in late augusts.  Had been in great health until came here.  She is in [place].  She had numbness in her right hand and up to her elbow sat 10/2.  Lasted 15-20 mins, then went away.  Recurred again that day and again 10/3.  They kept her 4 days, did CTs and MRI and EEG.  Then one little tingle since.  F/u with neurology PA who said she was doing great.  CT showed the hematoma was dissipating.    Then she came over here last Thursday.  She and her husband met with Shirlean Mylar and they went through the circuit training and she overdid it.  Back started to spasm the next morning.  She spent 4 days in the bed.  Tylenol controls it to some degree, but she's having difficulty going from a prone to sitting position.  She also has not pooped for a week.  Has been drinking water.  Took miralax last evening.  Had colace a few days before.  There's activity.  Her husband is a Financial trader.    She is active in her home though not recently doing a regular program of exercise.  They had moved to [place] to be near grandchildren and daughters.  Last one went to college and Mickel Baas wanted to move back so they did, too.  Currently Mickel Baas is helping them until they move officially into [place].  Sitting does not hurt.   She has tried heat sitting up in the chair.    Usually bp is controlled like 120/70.  Checks it at home.    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  Was admitted 6 wks about this.  She had one removed in her 89s.  Has been told she has some leftover ones in there that have not moved.  Macular degeneration right worse than left.  Sees retina specialist--has appt in Dec and will see one at [facility].  Had prior cataracts.    Shoulder surgery in past was from ski injury.  She'd seen PT and had work on it.  It was pre-MRI and determined not broken, but had torn  ligament and could not have it repair.  Has some limitation in vertical movement.    Uses latanoprost for high eye pressures.  Her mother had glaucoma and lived to 94.    She went to cardiology in [place] but they kept changing.  Her PCP took over those meds for her--ramipril and metoprolol ER.    Past Medical History:  Diagnosis Date  . Focal seizures ([place])    Per [place] new patient packet/ Right hand   . Hypertension   . Kidney stone   . Macular degeneration    Per [place] new patient packet   Past Surgical History:  Procedure Laterality Date  . CATARACT EXTRACTION  1997   Per [place] new patient packet  . KIDNEY STONE SURGERY     Per [place] new patient packet  . SHOULDER SURGERY  2008    Social History   Socioeconomic History  . Marital status: Married    Spouse name: Not on file  . Number of children: Not on file  . Years of education: Not on file  . Highest education level: Not on file  Occupational History  . Not on file  Tobacco Use  . Smoking status: Former Smoker    Packs/day: 1.00    Years: 13.00    Pack years: 13.00    Types: Cigarettes  . Smokeless tobacco: Never Used  Vaping Use  . Vaping Use: Never used  Substance and Sexual Activity  . Alcohol use: Yes    Comment: 12 drinks per week  . Drug use: Never  . Sexual activity: Not on file  Other Topics Concern  . Not on file  Social History Narrative   Diet Healthy      Do you drink/eat things with caffeine coffee  Morning only      Marital Status Married            What year were you married? 1955      Do you live in a house, apartment, assisted living, condo, trailer, etc.? Townhome      Is it one or more stories? 2 story      How many persons live in your home? 3         Do you have any pets in your home?(please list) Dog      Highest level of education completed: College degree      Current or past profession: Teacher      Do you exercise?:Yes    Type and how often: Walk, daily      Do you have a  Living Will? (Form that indicates scenarios where you would not want your life prolonged) Yes      Do you  have a DNR form?         If not, would you like to discuss one? No      Do you have signed POA/HPOA forms? Yes      Do you have difficulty bathing or dressing yourself? No      Do you have difficulty preparing food or eating? No      Do you have difficulty managing medications? No      Do you have difficulty managing your finances? No      Do you have difficulty affording your medications? No                  Social Determinants of Health   Financial Resource Strain:   . Difficulty of Paying Living Expenses: Not on file  Food Insecurity:   . Worried About Charity fundraiser in the Last Year: Not on file  . Ran Out of Food in the Last Year: Not on file  Transportation Needs:   . Lack of Transportation (Medical): Not on file  . Lack of Transportation (Non-Medical): Not on file  Physical Activity:   . Days of Exercise per Week: Not on file  . Minutes of Exercise per Session: Not on file  Stress:   . Feeling of Stress : Not on file  Social Connections:   . Frequency of Communication with Friends and Family: Not on file  . Frequency of Social Gatherings with Friends and Family: Not on file  . Attends Religious Services: Not on file  . Active Member of Clubs or Organizations: Not on file  . Attends Archivist Meetings: Not on file  . Marital Status: Not on file    reports that she has quit smoking. Her smoking use included cigarettes. She has a 13.00 pack-year smoking history. She has never used smokeless tobacco. She reports current alcohol use. She reports that she does not use drugs.  Functional Status Survey:    History reviewed. No pertinent family history.  Health Maintenance  Topic Date Due  . TETANUS/TDAP  Never done  . DEXA SCAN  Never done  . INFLUENZA VACCINE  Completed  . COVID-19 Vaccine  Completed  . PNA vac Low Risk Adult  Completed     Allergies  Allergen Reactions  . Scallops [Shellfish Allergy] Hives and Shortness Of Breath  . Sulfa Antibiotics Rash    Outpatient Encounter Medications as of 07/26/2020  Medication Sig  . acetaminophen (TYLENOL) 500 MG tablet Take 2 tablets (1,000 mg total) by mouth every 6 (six) hours as needed.  . latanoprost (XALATAN) 0.005 % ophthalmic solution Place 1 drop into both eyes at bedtime.  . levETIRAcetam (KEPPRA) 1000 MG tablet Take 1 tablet (1,000 mg total) by mouth 2 (two) times daily.  . metoprolol succinate (TOPROL-XL) 50 MG 24 hr tablet Take 50 mg by mouth daily. Take with or immediately following a meal.  . Multiple Vitamins-Minerals (PRESERVISION AREDS) CAPS Take 1 capsule by mouth daily.  . polyethylene glycol (MIRALAX) 17 g packet Take 17 g by mouth daily.  . ramipril (ALTACE) 10 MG capsule Take 10 mg by mouth daily.  Marland Kitchen senna-docusate (SENNA S) 8.6-50 MG tablet Take 2 tablets by mouth at bedtime.   No facility-administered encounter medications on file as of 07/26/2020.    Review of Systems  Constitutional: Negative for chills, fever and malaise/fatigue.  HENT: Positive for hearing loss.   Eyes: Negative for blurred vision.       Corrective lenses,  macular degeneration, ocular htn  Respiratory: Negative for cough and shortness of breath.   Cardiovascular: Negative for chest pain, palpitations and leg swelling.       Controlled htn, typically  Gastrointestinal: Negative for abdominal pain, blood in stool, constipation, diarrhea and melena.  Genitourinary: Negative for dysuria, frequency and urgency.  Musculoskeletal: Positive for back pain and myalgias. Negative for falls and joint pain.  Skin: Negative for itching and rash.  Neurological: Positive for tingling and sensory change. Negative for dizziness and loss of consciousness.  Endo/Heme/Allergies: Bruises/bleeds easily.  Psychiatric/Behavioral: Negative for depression and memory loss. The patient is not  nervous/anxious and does not have insomnia.     Vitals:   07/26/20 1429  BP: (!) 160/92  Pulse: 77  Temp: 97.7 F (36.5 C)  TempSrc: Temporal  SpO2: 97%  Weight: 138 lb 9.6 oz (62.9 kg)  Height: 5' 4"  (1.626 m)   Body mass index is 23.79 kg/m. Physical Exam Vitals reviewed.  Constitutional:      General: She is not in acute distress.    Appearance: Normal appearance. She is not toxic-appearing.  HENT:     Head: Normocephalic and atraumatic.     Right Ear: Tympanic membrane, ear canal and external ear normal.     Left Ear: Tympanic membrane, ear canal and external ear normal.     Nose: Nose normal.     Mouth/Throat:     Pharynx: Oropharynx is clear.  Eyes:     Extraocular Movements: Extraocular movements intact.     Conjunctiva/sclera: Conjunctivae normal.     Pupils: Pupils are equal, round, and reactive to light.  Cardiovascular:     Rate and Rhythm: Normal rate and regular rhythm.     Pulses: Normal pulses.     Heart sounds: Normal heart sounds.  Pulmonary:     Effort: Pulmonary effort is normal.     Breath sounds: Normal breath sounds.  Abdominal:     General: Bowel sounds are normal. There is no distension.     Palpations: Abdomen is soft.     Tenderness: There is no abdominal tenderness. There is no guarding or rebound.  Musculoskeletal:        General: Normal range of motion.     Cervical back: Neck supple. No rigidity.     Right lower leg: No edema.     Left lower leg: No edema.     Comments: Pain during positional change as laid down and sat up on exam table, but negative straight leg raise, not overtly tender to touch anywhere   Lymphadenopathy:     Cervical: No cervical adenopathy.  Skin:    General: Skin is warm and dry.  Neurological:     General: No focal deficit present.     Mental Status: She is alert and oriented to person, place, and time.     Cranial Nerves: No cranial nerve deficit.     Sensory: No sensory deficit.     Motor: No weakness.       Coordination: Coordination normal.     Gait: Gait normal.     Deep Tendon Reflexes: Reflexes normal.  Psychiatric:        Mood and Affect: Mood normal.        Behavior: Behavior normal.        Thought Content: Thought content normal.        Judgment: Judgment normal.     Labs reviewed: Basic Metabolic Panel: Recent Labs    06/27/20  9935 06/28/20 0410 07/05/20 1407  NA 129* 132* 131*  K 5.1 4.5 5.1  CL 97* 101 94*  CO2 22 20* 20  GLUCOSE 106* 106* 105*  BUN 14 16 14   CREATININE 0.90 0.90 0.91  CALCIUM 9.2 8.9 10.2   Liver Function Tests: Recent Labs    06/25/20 1232  AST 21  ALT 15  ALKPHOS 51  BILITOT 1.3*  PROT 7.1  ALBUMIN 4.0   No results for input(s): LIPASE, AMYLASE in the last 8760 hours. No results for input(s): AMMONIA in the last 8760 hours. CBC: Recent Labs    06/25/20 1232 06/26/20 0253 07/05/20 1407  WBC 5.4 5.6 5.0  NEUTROABS 3.1  --   --   HGB 12.8 12.5 13.2  HCT 38.4 36.2 39.7  MCV 99.7 99.7 99*  PLT 318 296 361   Cardiac Enzymes: No results for input(s): CKTOTAL, CKMB, CKMBINDEX, TROPONINI in the last 8760 hours. BNP: Invalid input(s): POCBNP Lab Results  Component Value Date   HGBA1C 5.6 06/26/2020    Assessment/Plan 1. Strain of lumbar paraspinal muscle, initial encounter - using tylenol - using heat -may use topicals - will try to decrease inflammation with prednisone taper -predniSONE (STERAPRED UNI-PAK 21 TAB) 10 MG (21) TBPK tablet; Use as directed  Dispense: 21 tablet; Refill: 0 - use muscle relaxer just at night to help her rest--side effects reviewed and accepted by pt and her daughter -methocarbamol (ROBAXIN) 500 MG tablet; Take 1 tablet (500 mg total) by mouth at bedtime as needed for muscle spasms.  Dispense: 14 tablet; Refill: 0 -also refer to PT here--she's [facility] and not on campus yet--Rx put in incoming basket in therapy office  2. Slow transit constipation -advised to take senna-s 2 tabs daily  and miralax daily until bowels move -take both tonight and again in am and hopefully that will work -has good bowel sounds and no tenderness  3. Primary hypertension -bp at goal typically, but having pain at present so elevated -cont toprol and ramipril  4. Stage 3a chronic kidney disease (HCC) -Avoid nephrotoxic agents like nsaids, dose adjust renally excreted meds, hydrate.  5. SDH (subdural hematoma) (HCC) -resolving gradually, no residual symptoms  6. Seizure ([place]) -in context of subdural, was still dissipating so remains on keppra  7. History of kidney stones -around pregnancy, but no difficulty since  8. Macular degeneration of both eyes, unspecified type -cont f/u with [facility]  Labs/tests ordered:  Just had a ton of labs recently that were reviewed  Teresita Fanton L. Regine Christian, D.O. [facility] [address], [place] [address] Cell Phone (Mon-Fri 8am-5pm):  [phone] On Call:  [phone] & follow prompts after 5pm & weekends Office Phone:  [phone] Office Fax:  [phone]

## 2020-07-28 ENCOUNTER — Encounter: Payer: Self-pay | Admitting: Internal Medicine

## 2020-07-28 DIAGNOSIS — Z87442 Personal history of urinary calculi: Secondary | ICD-10-CM | POA: Insufficient documentation

## 2020-07-28 DIAGNOSIS — S39012A Strain of muscle, fascia and tendon of lower back, initial encounter: Secondary | ICD-10-CM | POA: Insufficient documentation

## 2020-07-28 DIAGNOSIS — H3530 Unspecified macular degeneration: Secondary | ICD-10-CM | POA: Insufficient documentation

## 2020-07-28 DIAGNOSIS — K5901 Slow transit constipation: Secondary | ICD-10-CM | POA: Insufficient documentation

## 2020-08-04 ENCOUNTER — Other Ambulatory Visit: Payer: Self-pay | Admitting: Physician Assistant

## 2020-08-04 DIAGNOSIS — S065XAA Traumatic subdural hemorrhage with loss of consciousness status unknown, initial encounter: Secondary | ICD-10-CM

## 2020-08-07 ENCOUNTER — Encounter: Payer: Self-pay | Admitting: Internal Medicine

## 2020-08-24 ENCOUNTER — Other Ambulatory Visit: Payer: Self-pay

## 2020-08-24 ENCOUNTER — Ambulatory Visit
Admission: RE | Admit: 2020-08-24 | Discharge: 2020-08-24 | Disposition: A | Discharge status: Home / Self Care | Payer: Medicare Other | Source: Ambulatory Visit | Attending: Physician Assistant | Admitting: Physician Assistant

## 2020-08-24 DIAGNOSIS — S065X9A Traumatic subdural hemorrhage with loss of consciousness of unspecified duration, initial encounter: Secondary | ICD-10-CM

## 2020-08-24 DIAGNOSIS — S065XAA Traumatic subdural hemorrhage with loss of consciousness status unknown, initial encounter: Secondary | ICD-10-CM

## 2020-09-01 ENCOUNTER — Other Ambulatory Visit: Payer: Self-pay | Admitting: *Deleted

## 2020-09-01 ENCOUNTER — Telehealth: Payer: Self-pay

## 2020-09-01 DIAGNOSIS — M545 Low back pain, unspecified: Secondary | ICD-10-CM

## 2020-09-01 MED ORDER — RAMIPRIL 10 MG PO CAPS: 10.0000 mg | ORAL_CAPSULE | Freq: Every day | ORAL | 1 refills | Status: DC

## 2020-09-01 MED ORDER — METOPROLOL SUCCINATE ER 50 MG PO TB24
50.0000 mg | ORAL_TABLET | Freq: Every day | ORAL | 1 refills | Status: DC
Start: 2020-09-01 — End: 2022-11-07

## 2020-09-01 NOTE — Telephone Encounter (Signed)
Daughter called to check on status of the refill. She stated her mother has none of these 2 medications for tomorrow morning.

## 2020-09-01 NOTE — Telephone Encounter (Signed)
Vernona Rieger, daughter, called and stated that she dropped off a note Wednesday at [facility] for refills for patient's Medications.  Stated that she went by the pharmacy to pick those up and nothing was there.   Pended Rx's and sent to Dr. Renato Gails for approval.

## 2020-09-01 NOTE — Telephone Encounter (Signed)
Per the note that was given to I thought it was to update the pharmacy not a medication refill.  rx sent to pharmacy by e-script

## 2020-09-01 NOTE — Telephone Encounter (Signed)
Medication update and drug store changed to [facility] in [place].

## 2020-09-11 ENCOUNTER — Ambulatory Visit (INDEPENDENT_AMBULATORY_CARE_PROVIDER_SITE_OTHER): Payer: Medicare Other | Admitting: Neurology

## 2020-09-11 ENCOUNTER — Encounter: Payer: Self-pay | Admitting: Neurology

## 2020-09-11 VITALS — BP 134/70 | HR 79 | Ht 64.0 in | Wt 134.0 lb

## 2020-09-11 DIAGNOSIS — G40209 Localization-related (focal) (partial) symptomatic epilepsy and epileptic syndromes with complex partial seizures, not intractable, without status epilepticus: Secondary | ICD-10-CM | POA: Insufficient documentation

## 2020-09-11 DIAGNOSIS — S065XAA Traumatic subdural hemorrhage with loss of consciousness status unknown, initial encounter: Secondary | ICD-10-CM

## 2020-09-11 DIAGNOSIS — I679 Cerebrovascular disease, unspecified: Secondary | ICD-10-CM | POA: Diagnosis not present

## 2020-09-11 MED ORDER — LEVETIRACETAM 500 MG PO TABS
500.0000 mg | ORAL_TABLET | Freq: Two times a day (BID) | ORAL | 4 refills | Status: DC
Start: 2020-09-11 — End: 2021-03-13

## 2020-09-11 NOTE — Progress Notes (Signed)
Chief Complaint  Patient presents with  . New Patient (Initial Visit)    She is here with her daughter, Shirleymae Hauth. Referred after recent hospitalization for SDH/seizures. She has been placed on levetiracetam 1000mg , one tab BID. No further events since starting this medication.     HISTORICAL  Nichole Cox is a 86 year old female, seen in request by her primary care physician Dr. 92, Renato Gails for evaluation of subdural hematoma, focal seizure, initial evaluation was on September 11, 2020.  I reviewed and summarized the referring note. PMHx HTN  She moved from September 13, 2020 to [place] at the end of September 2021, she denies a history of head injury, on 06/20/2020, she noticed recurrent episode of shooting sensation rising from her right hand to right elbow, mild slurred speech, droopy of right face, there was no loss of consciousness, last 2 to 3 minutes  She had few similar recurrent episodes on 06/25/2020, was brought to the emergency room, was diagnosed with partial seizure, imaging study reviewed left subdural hematoma  I personally reviewed multiple CTs, MRI of the brain, MRA of the brain and neck October 2021: Complex left cerebral convexity extra-axial hemorrhage, measuring up to 13 millimeters posteriorly, compatible with recent subdural hemorrhage, local mass-effect without midline shift,  No evidence of significant neck and intracranial large vessel disease, mild irregularity of bilateral V3 vertebral artery, bilateral cervical internal carotid artery, likely related to atherosclerosis, less than 50%  Stenosis  She was treated with Keppra 500 mg twice a day, tolerating the medication well, there was no recurrent seizure activity  She now moved to assisted living,  REVIEW OF SYSTEMS: Full 14 system review of systems performed and notable only for as above All other review of systems were negative.  ALLERGIES: Allergies  Allergen Reactions  . Scallops [Shellfish  Allergy] Hives and Shortness Of Breath  . Sulfa Antibiotics Rash    HOME MEDICATIONS: Current Outpatient Medications  Medication Sig Dispense Refill  . latanoprost (XALATAN) 0.005 % ophthalmic solution Place 1 drop into both eyes at bedtime.    . levETIRAcetam (KEPPRA) 1000 MG tablet Take 1 tablet (1,000 mg total) by mouth 2 (two) times daily. 60 tablet 0  . metoprolol succinate (TOPROL-XL) 50 MG 24 hr tablet Take 1 tablet (50 mg total) by mouth daily. Take with or immediately following a meal. 90 tablet 1  . Multiple Vitamins-Minerals (PRESERVISION AREDS) CAPS Take 1 capsule by mouth daily.    . polyethylene glycol (MIRALAX) 17 g packet Take 17 g by mouth daily. 14 each 0  . ramipril (ALTACE) 10 MG capsule Take 1 capsule (10 mg total) by mouth daily. 90 capsule 1   No current facility-administered medications for this visit.    PAST MEDICAL HISTORY: Past Medical History:  Diagnosis Date  . Focal seizures (HCC)    Per [facility] new patient packet/ Right hand   . Hearing loss   . Hyperlipidemia   . Hypertension   . Kidney stone   . Macular degeneration    Per [facility] new patient packet  . Nephrolithiasis   . SDH (subdural hematoma) (HCC)     PAST SURGICAL HISTORY: Past Surgical History:  Procedure Laterality Date  . CATARACT EXTRACTION  1997   Per [facility] new patient packet  . KIDNEY STONE SURGERY     Per [facility] new patient packet  . KIDNEY STONE SURGERY  1959, 1960, 51  . ROTATOR CUFF REPAIR  2010  . SHOULDER SURGERY  2008  . transglobal amnesia  2020    FAMILY HISTORY: Family History  Problem Relation Age of Onset  . Heart failure Mother        age 25  . Lung cancer Father        age 82    SOCIAL HISTORY: Social History   Socioeconomic History  . Marital status: Married    Spouse name: cecil  . Number of children: 2  . Years of education: college  . Highest education level: Bachelor's degree (e.g., BA, AB, BS)  Occupational History  . 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 Smoking status: Former Smoker    Packs/day: 1.00    Years: 13.00    Pack years: 13.00    Types: Cigarettes  . Smokeless tobacco: Never Used  Vaping Use  . Vaping Use: Never used  Substance and Sexual Activity  . Alcohol use: Yes    Comment: 1-2 drinks of scotch per night  . Drug use: Never  . Sexual activity: Not Currently  Other Topics Concern  . Not on file  Social History Narrative   Diet Healthy      Do you drink/eat things with caffeine coffee  Morning only      Marital Status Married            What year were you married? 1955      Do you live in a house, apartment, assisted living, condo, trailer, etc.? Townhome      Is it one or more stories? 2 story      How many persons live in your home? 3         Do you have any pets in your home?(please list) Dog      Highest level of education completed: College degree      Current or past profession: Teacher      Do you exercise?:Yes    Type and how often: Walk, daily      Do you have a Living Will? (Form that indicates scenarios where you would not want your life prolonged) Yes      Do you have a DNR form?         If not, would you like to discuss one? No      Do you have signed POA/HPOA forms? Yes      Do you have difficulty bathing or dressing yourself? No      Do you have difficulty preparing food or eating? No      Do you have difficulty managing medications? No      Do you have difficulty managing your finances? No      Do you have difficulty affording your medications? No      Lives at home with her husband (retired MD - Sports administrator) and daughter (retired Charity fundraiser).   Right-handed.   1.5 cups per day.                  Social Determinants of Health   Financial Resource Strain: Not on file  Food Insecurity: Not on file  Transportation Needs: Not on file  Physical Activity: Not on file  Stress: Not on file  Social Connections: Not on file  Intimate Partner  Violence: Not on file     PHYSICAL EXAM   Vitals:   09/11/20 1051  BP: 134/70  Pulse: 79  Weight: 134 lb (60.8 kg)  Height: 5\' 4"  (1.626 m)   Not recorded  Body mass index is 23 kg/m.  PHYSICAL EXAMNIATION:  Gen: NAD, conversant, well nourised, well groomed                     Cardiovascular: Regular rate rhythm, no peripheral edema, warm, nontender. Eyes: Conjunctivae clear without exudates or hemorrhage Neck: Supple, no carotid bruits. Pulmonary: Clear to auscultation bilaterally   NEUROLOGICAL EXAM:  MENTAL STATUS: Speech:    Speech is normal; fluent and spontaneous with normal comprehension.  Cognition:     Orientation to time, place and person     Normal recent and remote memory     Normal Attention span and concentration     Normal Language, naming, repeating,spontaneous speech     Fund of knowledge   CRANIAL NERVES: CN II: Visual fields are full to confrontation. Pupils are round equal and briskly reactive to light. CN III, IV, VI: extraocular movement are normal. No ptosis. CN V: Facial sensation is intact to light touch CN VII: Face is symmetric with normal eye closure  CN VIII: Hearing is normal to causal conversation. CN IX, X: Phonation is normal. CN XI: Head turning and shoulder shrug are intact  MOTOR: Mild fixation of right arm upon rapid rotating movement.  REFLEXES: Reflexes are 2+ and symmetric at the biceps, triceps, knees, and ankles. Plantar responses are flexor.  SENSORY: Intact to light touch, pinprick and vibratory sensation are intact in fingers and toes.  COORDINATION: There is no trunk or limb dysmetria noted.  GAIT/STANCE: She needs push-up to get up from seated position, steady,   DIAGNOSTIC DATA (LABS, IMAGING, TESTING) - I reviewed patient records, labs, notes, testing and imaging myself where available.   ASSESSMENT AND PLAN  ALAISA MOFFITT is a 86 y.o. female   Left subdural hematoma in October  2021 Partial seizure, Cerebrovascular disease  Echocardiogram to complete cerebrovascular stratification  She has hypertension, will hold off antiplatelet agent for right now  Keep Keppra 500 mg twice a day  Continue moderate exercise     Levert Feinstein, M.D. Ph.D.  [facility] [address] [place], [place] [address] Ph: [phone] Fax: [phone]  CC:  Kermit Balo, DO

## 2020-11-13 ENCOUNTER — Encounter: Payer: Self-pay | Admitting: Internal Medicine

## 2020-12-27 ENCOUNTER — Encounter: Payer: Self-pay | Admitting: Internal Medicine

## 2021-01-08 ENCOUNTER — Other Ambulatory Visit: Payer: Self-pay

## 2021-01-08 ENCOUNTER — Non-Acute Institutional Stay: Payer: Medicare Other | Admitting: Adult Health

## 2021-01-08 ENCOUNTER — Encounter: Payer: Self-pay | Admitting: Adult Health

## 2021-01-08 VITALS — BP 116/62 | HR 65 | Temp 96.2°F | Ht 64.0 in | Wt 142.0 lb

## 2021-01-08 DIAGNOSIS — I10 Essential (primary) hypertension: Secondary | ICD-10-CM | POA: Diagnosis not present

## 2021-01-08 DIAGNOSIS — S065X9A Traumatic subdural hemorrhage with loss of consciousness of unspecified duration, initial encounter: Secondary | ICD-10-CM | POA: Diagnosis not present

## 2021-01-08 DIAGNOSIS — G40209 Localization-related (focal) (partial) symptomatic epilepsy and epileptic syndromes with complex partial seizures, not intractable, without status epilepticus: Secondary | ICD-10-CM | POA: Diagnosis not present

## 2021-01-08 DIAGNOSIS — N1831 Chronic kidney disease, stage 3a: Secondary | ICD-10-CM

## 2021-01-08 DIAGNOSIS — Z23 Encounter for immunization: Secondary | ICD-10-CM

## 2021-01-08 DIAGNOSIS — H3530 Unspecified macular degeneration: Secondary | ICD-10-CM

## 2021-01-08 DIAGNOSIS — S065XAA Traumatic subdural hemorrhage with loss of consciousness status unknown, initial encounter: Secondary | ICD-10-CM

## 2021-01-08 NOTE — Progress Notes (Signed)
Location:  [facility]  Provider:  Peggye Ley, ANP [facility] [phone]   Code Status: DNR Goals of Care:  Advanced Directives 07/26/2020  Does Patient Have a Medical Advance Directive? Yes  Type of Estate agent of [place];Living will  Does patient want to make changes to medical advance directive? No - Patient declined  Copy of Healthcare Power of Attorney in Chart? No - copy requested     Chief Complaint  Patient presents with  . Medical Management of Chronic Issues    6 month follow up.    HPI: Patient is a 87 y.o. female seen today for medical management of chronic diseases.    Exercises 4 days a week and is not having any further back pain  Bowels are moving well and does not need a laxative.   Has an apt with ophthalmology in May due to worsening vision in the right eye also has MD and a "wrinkled retina"   Hx of SDH and seizure in 2021. Continues on Keppra and followed by neurology with no new issues.  BP controlled  Due for tetanus shot  Does not want to do dexa scan right now  Aged out of mammograms     Past Medical History:  Diagnosis Date  . Focal seizures (HCC)    Per [facility] new patient packet/ Right hand   . Hearing loss   . Hyperlipidemia   . Hypertension   . Kidney stone   . Macular degeneration    Per [facility] new patient packet  . Nephrolithiasis   . SDH (subdural hematoma) (HCC)     Past Surgical History:  Procedure Laterality Date  . CATARACT EXTRACTION  1997   Per [facility] new patient packet  . KIDNEY STONE SURGERY     Per [facility] new patient packet  . KIDNEY STONE SURGERY  1959, 1960, 1  . ROTATOR CUFF REPAIR  2010  . SHOULDER SURGERY  2008  . transglobal amnesia  2020    Allergies  Allergen Reactions  . Scallops [Shellfish Allergy] Hives and Shortness Of Breath  . Sulfa Antibiotics Rash    Outpatient Encounter Medications as of 01/08/2021   Medication Sig  . latanoprost (XALATAN) 0.005 % ophthalmic solution Place 1 drop into both eyes at bedtime.  . levETIRAcetam (KEPPRA) 500 MG tablet Take 1 tablet (500 mg total) by mouth 2 (two) times daily.  . metoprolol succinate (TOPROL-XL) 50 MG 24 hr tablet Take 1 tablet (50 mg total) by mouth daily. Take with or immediately following a meal.  . Multiple Vitamins-Minerals (PRESERVISION AREDS) CAPS Take 1 capsule by mouth in the morning and at bedtime.  . polyethylene glycol (MIRALAX) 17 g packet Take 17 g by mouth daily.  . ramipril (ALTACE) 10 MG capsule Take 1 capsule (10 mg total) by mouth daily.   No facility-administered encounter medications on file as of 01/08/2021.    Review of Systems:  Review of Systems  Constitutional: Negative for activity change, appetite change, chills, diaphoresis, fatigue, fever and unexpected weight change.  HENT: Negative for congestion.   Eyes: Negative for photophobia, pain, discharge, redness and itching.       Macular degeneration with right eye worse  Respiratory: Negative for cough, shortness of breath and wheezing.   Cardiovascular: Negative for chest pain, palpitations and leg swelling.  Gastrointestinal: Negative for abdominal distention, abdominal pain, constipation and diarrhea.  Genitourinary: Negative for difficulty urinating and dysuria.  Musculoskeletal: Negative  for arthralgias, back pain, gait problem, joint swelling and myalgias.  Neurological: Negative for dizziness, tremors, seizures, syncope, facial asymmetry, speech difficulty, weakness, light-headedness, numbness and headaches.  Psychiatric/Behavioral: Negative for agitation, behavioral problems and confusion.    Health Maintenance  Topic Date Due  . TETANUS/TDAP  Never done  . DEXA SCAN  Never done  . INFLUENZA VACCINE  04/23/2021  . COVID-19 Vaccine  Completed  . PNA vac Low Risk Adult  Completed  . HPV VACCINES  Aged Out    Physical Exam: Vitals:   01/08/21 1300   BP: 116/62  Pulse: 65  Temp: (!) 96.2 F (35.7 C)  SpO2: 96%  Weight: 142 lb (64.4 kg)  Height: 5\' 4"  (1.626 m)   Body mass index is 24.37 kg/m. Physical Exam Vitals and nursing note reviewed.  Constitutional:      General: She is not in acute distress.    Appearance: She is not diaphoretic.  HENT:     Head: Normocephalic and atraumatic.     Right Ear: Tympanic membrane and ear canal normal.     Left Ear: Tympanic membrane and ear canal normal.     Nose: Nose normal. No congestion.     Mouth/Throat:     Mouth: Mucous membranes are moist.     Pharynx: Oropharynx is clear.  Eyes:     Conjunctiva/sclera: Conjunctivae normal.     Pupils: Pupils are equal, round, and reactive to light.  Neck:     Vascular: No JVD.  Cardiovascular:     Rate and Rhythm: Normal rate and regular rhythm.     Heart sounds: No murmur heard.   Pulmonary:     Effort: Pulmonary effort is normal. No respiratory distress.     Breath sounds: Normal breath sounds. No wheezing.  Abdominal:     General: Abdomen is flat. Bowel sounds are normal. There is no distension.     Palpations: Abdomen is soft.     Tenderness: There is no abdominal tenderness.  Musculoskeletal:        General: No swelling, tenderness, deformity or signs of injury.     Cervical back: No rigidity or tenderness.     Right lower leg: No edema.     Left lower leg: No edema.  Lymphadenopathy:     Cervical: No cervical adenopathy.  Skin:    General: Skin is warm and dry.  Neurological:     General: No focal deficit present.     Mental Status: She is alert and oriented to person, place, and time. Mental status is at baseline.  Psychiatric:        Mood and Affect: Mood normal.     Labs reviewed: Basic Metabolic Panel: Recent Labs    05/24/20 0000 06/25/20 1232 06/27/20 0908 06/28/20 0410 07/05/20 1407  NA 134*   < > 129* 132* 131*  K 4.7   < > 5.1 4.5 5.1  CL 97*   < > 97* 101 94*  CO2 19   < > 22 20* 20  GLUCOSE  --     < > 106* 106* 105*  BUN  --    < > 14 16 14   CREATININE  --    < > 0.90 0.90 0.91  CALCIUM 10.5   < > 9.2 8.9 10.2  TSH 3.24  --   --   --   --    < > = values in this interval not displayed.   Liver Function Tests: Recent Labs  05/24/20 0000 06/25/20 1232  AST 17 21  ALT 16 15  ALKPHOS  --  51  BILITOT  --  1.3*  PROT  --  7.1  ALBUMIN 4.6 4.0   No results for input(s): LIPASE, AMYLASE in the last 8760 hours. No results for input(s): AMMONIA in the last 8760 hours. CBC: Recent Labs    06/25/20 1232 06/26/20 0253 07/05/20 1407  WBC 5.4 5.6 5.0  NEUTROABS 3.1  --   --   HGB 12.8 12.5 13.2  HCT 38.4 36.2 39.7  MCV 99.7 99.7 99*  PLT 318 296 361   Lipid Panel: Recent Labs    05/24/20 0000 06/26/20 0253  CHOL 235* 178  HDL  --  54  LDLCALC  --  944*  TRIG 125 75  CHOLHDL  --  3.3   Lab Results  Component Value Date   HGBA1C 5.6 06/26/2020    Procedures since last visit: No results found.  Assessment/Plan  1. Primary hypertension Controlled Continue Altace 10  Mg qd and Toprol 50 mg Xl qd  2. SDH (subdural hematoma) (HCC) Resolved Followed by neurology with further issues  3. Partial symptomatic epilepsy with complex partial seizures, not intractable, without status epilepticus (HCC) No new Continues on Keppra 500 mg bid  4. Stage 3a chronic kidney disease (HCC) Needs BMP   5. Macular degeneration of both eyes, unspecified type F/U with Dr Charlotte Sanes in May  6. Immunization due Ordered Tdap   F/U in 4 months with Dr. Chales Abrahams Labs/tests ordered:  CBC BMP Next appt:  Visit date not found

## 2021-01-09 LAB — CBC AND DIFFERENTIAL
HCT: 36 (ref 36–46)
Hemoglobin: 12.5 (ref 12.0–16.0)
Platelets: 239 (ref 150–399)
WBC: 3.1

## 2021-01-09 LAB — CBC: RBC: 3.75 — AB (ref 3.87–5.11)

## 2021-03-13 ENCOUNTER — Encounter: Payer: Self-pay | Admitting: Neurology

## 2021-03-13 ENCOUNTER — Ambulatory Visit (INDEPENDENT_AMBULATORY_CARE_PROVIDER_SITE_OTHER): Payer: Medicare Other | Admitting: Neurology

## 2021-03-13 ENCOUNTER — Telehealth: Payer: Self-pay | Admitting: Neurology

## 2021-03-13 VITALS — BP 132/83 | HR 72 | Ht 64.0 in | Wt 144.8 lb

## 2021-03-13 DIAGNOSIS — G40209 Localization-related (focal) (partial) symptomatic epilepsy and epileptic syndromes with complex partial seizures, not intractable, without status epilepticus: Secondary | ICD-10-CM

## 2021-03-13 DIAGNOSIS — I679 Cerebrovascular disease, unspecified: Secondary | ICD-10-CM

## 2021-03-13 MED ORDER — LEVETIRACETAM 500 MG PO TABS: 500.0000 mg | ORAL_TABLET | Freq: Two times a day (BID) | ORAL | 4 refills | Status: DC

## 2021-03-13 NOTE — Telephone Encounter (Signed)
Dr. Terrace Arabia ordered echo at last visit, was not completed, is order still good to resend in order to be scheduled? Let me know if new order is needed. thanks

## 2021-03-13 NOTE — Patient Instructions (Signed)
Will continue the Keppra at current dose  Get you set up for echocardiogram  See you back in 1 year or sooner if needed

## 2021-03-13 NOTE — Progress Notes (Signed)
Chief Complaint  Patient presents with   Follow-up    Room 15 with daughter, Nichole Cox. Six month follow up. Doing well on generic Keppra 500mg , one tab BID. Denies any seizure activity. No issues with headaches.     HISTORICAL  Nichole Cox is a 87 year old female, seen in request by her primary care physician Dr. 92, Renato Gails for evaluation of subdural hematoma, focal seizure, initial evaluation was on September 11, 2020.  I reviewed and summarized the referring note. PMHx HTN  She moved from September 13, 2020 to [place] at the end of September 2021, she denies a history of head injury, on 06/20/2020, she noticed recurrent episode of shooting sensation rising from her right hand to right elbow, mild slurred speech, droopy of right face, there was no loss of consciousness, last 2 to 3 minutes  She had few similar recurrent episodes on 06/25/2020, was brought to the emergency room, was diagnosed with partial seizure, imaging study reviewed left subdural hematoma  I personally reviewed multiple CTs, MRI of the brain, MRA of the brain and neck October 2021: Complex left cerebral convexity extra-axial hemorrhage, measuring up to 13 millimeters posteriorly, compatible with recent subdural hemorrhage, local mass-effect without midline shift,  No evidence of significant neck and intracranial large vessel disease, mild irregularity of bilateral V3 vertebral artery, bilateral cervical internal carotid artery, likely related to atherosclerosis, less than 50%  Stenosis  She was treated with Keppra 500 mg twice a day, tolerating the medication well, there was no recurrent seizure activity  She now moved to assisted living,  Update March 13, 2021 SS: Here today with her daughter. Resides at [facility]. Remains on Keppra 500 mg twice daily since SDH, no seizure since then, no falls. Doing well, her main issue is macular degeneration in the right eye.  Her husband has dementia. Echo  ordered at last visit, not yet completed.  Has no complaints today.  She would like to add this history to her chart: In 2014, had been working in her garden, leaning over, working most of the afternoon, came inside to watch TV, all of a sudden felt things weren't real, went to the hospital, MRI negative for stroke, diagnosed with transient global amnesia lasted 4:00 pm to midnight, then back to normal.   REVIEW OF SYSTEMS: Full 14 system review of systems performed and notable only for as above  See HPI  ALLERGIES: Allergies  Allergen Reactions   Scallops [Shellfish Allergy] Hives and Shortness Of Breath   Sulfa Antibiotics Rash    HOME MEDICATIONS: Current Outpatient Medications  Medication Sig Dispense Refill   latanoprost (XALATAN) 0.005 % ophthalmic solution Place 1 drop into both eyes at bedtime.     metoprolol succinate (TOPROL-XL) 50 MG 24 hr tablet Take 1 tablet (50 mg total) by mouth daily. Take with or immediately following a meal. 90 tablet 1   Multiple Vitamins-Minerals (PRESERVISION AREDS) CAPS Take 1 capsule by mouth in the morning and at bedtime.     ramipril (ALTACE) 10 MG capsule Take 1 capsule (10 mg total) by mouth daily. 90 capsule 1   levETIRAcetam (KEPPRA) 500 MG tablet Take 1 tablet (500 mg total) by mouth 2 (two) times daily. 180 tablet 4   No current facility-administered medications for this visit.    PAST MEDICAL HISTORY: Past Medical History:  Diagnosis Date   Focal seizures (HCC)    Per [facility] new patient packet/ Right hand    Hearing loss    Hyperlipidemia  Hypertension    Kidney stone    Macular degeneration    Per [facility] new patient packet   Nephrolithiasis    SDH (subdural hematoma) (HCC)     PAST SURGICAL HISTORY: Past Surgical History:  Procedure Laterality Date   CATARACT EXTRACTION  1997   Per [facility] new patient packet   KIDNEY STONE SURGERY     Per [facility] new patient packet   KIDNEY STONE SURGERY  1959, 67, 9   60 CUFF REPAIR   2010   SHOULDER SURGERY  2008   transglobal amnesia  2020    FAMILY HISTORY: Family History  Problem Relation Age of Onset   Heart failure Mother        age 74   Lung cancer Father        age 49    SOCIAL HISTORY: Social History   Socioeconomic History   Marital status: Married    Spouse name: cecil   Number of children: 2   Years of education: college   Highest education level: Bachelor's degree (e.g., BA, AB, BS)  Occupational History   Occupation: Runner, 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 1955      Do you live in a house, apartment, assisted living, condo, trailer, etc.? Townhome      Is it one or more stories? 2 story      How many persons live in your home? 3         Do you have any pets in your home?(please list) Dog      Highest level of education completed: College degree      Current or past profession: Teacher      Do you exercise?:Yes    Type and how often: Walk, daily      Do you have a Living Will? (Form that indicates scenarios where you would not want your life prolonged) Yes      Do you have a DNR form?         If not, would you like to discuss one? No      Do you have signed POA/HPOA forms? Yes      Do you have difficulty bathing or dressing yourself? No      Do you have difficulty preparing food or eating? No      Do you have difficulty managing medications? No      Do you have difficulty  managing your finances? No      Do you have difficulty affording your medications? No      Lives at home with her husband (retired MD - Sports administrator) and daughter (retired Charity fundraiser).   Right-handed.   1.5 cups per day.  Social Determinants of Health   Financial Resource Strain: Not on file  Food Insecurity: Not on file  Transportation Needs: Not on file  Physical Activity: Not on file  Stress: Not on file  Social Connections: Not on file  Intimate Partner Violence: Not on file   PHYSICAL EXAM   Vitals:   03/13/21 1107  BP: 132/83  Pulse: 72  Weight: 144 lb 12.8 oz (65.7 kg)  Height: 5\' 4"  (1.626 m)    Not recorded     Body mass index is 24.85 kg/m.  PHYSICAL EXAMNIATION:  Physical Exam  General: The patient is alert and cooperative at the time of the examination.  Very pleasant, well-appearing.  Skin: No significant peripheral edema is noted.  Neurologic Exam  Mental status: The patient is alert and oriented x 3 at the time of the examination. The patient has apparent normal recent and remote memory, with an apparently normal attention span and concentration ability.  Cranial nerves: Facial symmetry is present. Speech is normal, no aphasia or dysarthria is noted. Extraocular movements are full. Visual fields are full.  Motor: The patient has good strength in all 4 extremities.  Sensory examination: Soft touch sensation is symmetric on the face, arms, and legs.  Coordination: The patient has good finger-nose-finger and heel-to-shin bilaterally.  Gait and station: Gait is overall normal, steady, independent.  Reflexes: Deep tendon reflexes are symmetric.  DIAGNOSTIC DATA (LABS, IMAGING, TESTING) - I reviewed patient records, labs, notes, testing and imaging myself where available.  ASSESSMENT AND PLAN  Nichole Cox is a 87 y.o. female   Left subdural hematoma in October 2021 2.   Partial seizure, 3.   Cerebrovascular  disease  -Scheduled for echocardiogram 04/03/21 to complete cerebrovascular stratification -Has HTN, holding antiplatelet agent for now, indicates her cardiologist discontinued aspirin about 2 years ago felt no longer needed -No recurrent seizures, will continue Keppra 500 mg twice daily, no adverse effect -Encouraged to continue activity, seems to be doing great at this point -Follow-up in 1 year or sooner if needed  06/04/21, Margie Ege, DNP  [facility] [address] [place], [place] [phone]

## 2021-03-13 NOTE — Telephone Encounter (Signed)
Patient is scheduled for 04/03/21

## 2021-03-27 ENCOUNTER — Non-Acute Institutional Stay: Payer: Medicare Other | Admitting: Orthopedic Surgery

## 2021-03-27 ENCOUNTER — Encounter: Payer: Self-pay | Admitting: Orthopedic Surgery

## 2021-03-27 DIAGNOSIS — H6121 Impacted cerumen, right ear: Secondary | ICD-10-CM | POA: Diagnosis not present

## 2021-03-27 MED ORDER — DEBROX 6.5 % OT SOLN: 5.0000 [drp] | Freq: Two times a day (BID) | OTIC | 0 refills | Status: AC

## 2021-03-27 NOTE — Addendum Note (Signed)
Addended byHazle Nordmann E on: 03/27/2021 08:50 PM   Modules accepted: Level of Service

## 2021-03-27 NOTE — Progress Notes (Signed)
Location:   [facility] Room Number: 622 Place of Service:  ALF [id number]) Provider:  Hazle Nordmann, NP  Mahlon Gammon, MD  Patient Care Team: Mahlon Gammon, MD as PCP - General (Internal Medicine) Pa, [facility] as Consulting Physician (Neurosurgery) Levert Feinstein, MD as Consulting Physician (Neurology)  Extended Emergency Contact Information Primary Emergency Contact: Donald,CECIL Address: [address]          [place], [place] [address] Darden Amber of [place] Home Phone: [phone] Relation: Spouse Secondary Emergency Contact: Renne Crigler Address: [address]          [place], [place] [address] Darden Amber of [place] Home Phone: [phone] Mobile Phone: [phone] Relation: Daughter  Code Status:  DNR Goals of care: Advanced Directive information Advanced Directives 03/27/2021  Does Patient Have a Medical Advance Directive? Yes  Type of Estate agent of [place];Living will  Does patient want to make changes to medical advance directive? No - Patient declined  Copy of Healthcare Power of Attorney in Chart? No - copy requested     Chief Complaint  Patient presents with   Acute Visit    Cerumen buildup    HPI:  Pt is a 87 y.o. female seen today for an acute visit for right cerumen buildup.   Patient reports having trouble hearing out of right ear within the last few days. She thought it was due to wax buildup. She used a qtip to clean her ear and noticed blood instead of wax. She then proceeded to clean her ear with warm water after noticing blood. Issues with hearing subsided after rinsing with right ear with arm water. She suspects she had a pimple in her ear canal. Denies ear pain, tinnitus, N/V or loss of hearing.  Nurse does not report any other concerns, vitals stable.    Past Medical History:  Diagnosis Date   Focal seizures (HCC)    Per [facility] new patient  packet/ Right hand    Hearing loss    Hyperlipidemia    Hypertension    Kidney stone    Macular degeneration    Per [facility] new patient packet   Nephrolithiasis    SDH (subdural hematoma) (HCC)    Past Surgical History:  Procedure Laterality Date   CATARACT EXTRACTION  1997   Per [facility] new patient packet   KIDNEY STONE SURGERY     Per [facility] new patient packet   KIDNEY STONE SURGERY  1959, 36, 29   64 CUFF REPAIR  2010   SHOULDER SURGERY  2008   [employer] amnesia  2020    Allergies  Allergen Reactions   Scallops [Shellfish Allergy] Hives and Shortness Of Breath   Sulfa Antibiotics Rash    Allergies as of 03/27/2021       Reactions   Scallops [shellfish Allergy] Hives, Shortness Of Breath   Sulfa Antibiotics Rash        Medication List        Accurate as of March 27, 2021  2:53 PM. If you have any questions, ask your nurse or doctor.          latanoprost 0.005 % ophthalmic solution Commonly known as: XALATAN Place 1 drop into both eyes at bedtime.   levETIRAcetam 500 MG tablet Commonly known as: KEPPRA Take 1 tablet (500 mg total) by mouth 2 (two) times daily.   metoprolol succinate 50 MG 24 hr tablet Commonly known as: TOPROL-XL Take 1 tablet (  50 mg total) by mouth daily. Take with or immediately following a meal.   PreserVision AREDS Caps Take 1 capsule by mouth in the morning and at bedtime.   ramipril 10 MG capsule Commonly known as: ALTACE Take 1 capsule (10 mg total) by mouth daily.   sennosides-docusate sodium 8.6-50 MG tablet Commonly known as: SENOKOT-S Take 1 tablet by mouth as needed for constipation.        Review of Systems  Constitutional:  Negative for activity change, appetite change, fatigue and fever.  HENT:  Positive for hearing loss.         wax buildup  Respiratory:  Negative for cough, shortness of breath and wheezing.   Cardiovascular:  Negative for chest pain and leg swelling.  Gastrointestinal:  Negative for nausea  and vomiting.  Psychiatric/Behavioral:  Negative for dysphoric mood. The patient is not nervous/anxious.    Immunization History  Administered Date(s) Administered   Fluad Quad(high Dose 65+) 06/29/2020   Moderna Sars-Covid-2 Vaccination 10/01/2019, 10/29/2019, 08/08/2020   Pneumococcal Conjugate-13 09/24/2015   Pneumococcal Polysaccharide-23 09/23/2017   Pertinent  Health Maintenance Due  Topic Date Due   DEXA SCAN  Never done   INFLUENZA VACCINE  04/23/2021   PNA vac Low Risk Adult  Completed   Fall Risk  01/08/2021 07/26/2020  Falls in the past year? 0 0  Number falls in past yr: 0 0  Injury with Fall? - 0   Functional Status Survey:    Vitals:   03/27/21 1448  BP: (!) 109/59  Pulse: 78  Resp: 18  Temp: (!) 97.5 F (36.4 C)  SpO2: 98%  Weight: 140 lb 12.8 oz (63.9 kg)  Height: 5\' 4"  (1.626 m)   Body mass index is 24.17 kg/m. Physical Exam Vitals reviewed.  Constitutional:      General: She is not in acute distress. HENT:     Head: Normocephalic.     Right Ear: Hearing normal. There is impacted cerumen.     Left Ear: Hearing normal.     Ears:     Comments: Right ear canal with dried blood and mild redness, cannot visualize TM due to cerumen impaction.  Cardiovascular:     Rate and Rhythm: Normal rate and regular rhythm.     Pulses: Normal pulses.     Heart sounds: Normal heart sounds.  Pulmonary:     Effort: Pulmonary effort is normal. No respiratory distress.     Breath sounds: Normal breath sounds. No wheezing.  Skin:    General: Skin is warm and dry.  Neurological:     General: No focal deficit present.     Mental Status: She is alert and oriented to person, place, and time.  Psychiatric:        Mood and Affect: Mood normal.        Behavior: Behavior normal.    Labs reviewed: Recent Labs    06/27/20 0908 06/28/20 0410 07/05/20 1407  NA 129* 132* 131*  K 5.1 4.5 5.1  CL 97* 101 94*  CO2 22 20* 20  GLUCOSE 106* 106* 105*  BUN 14 16 14    CREATININE 0.90 0.90 0.91  CALCIUM 9.2 8.9 10.2   Recent Labs    05/24/20 0000 06/25/20 1232  AST 17 21  ALT 16 15  ALKPHOS  --  51  BILITOT  --  1.3*  PROT  --  7.1  ALBUMIN 4.6 4.0   Recent Labs    06/25/20 1232 06/26/20 0253 07/05/20 1407  01/09/21 0000  WBC 5.4 5.6 5.0 3.1  NEUTROABS 3.1  --   --   --   HGB 12.8 12.5 13.2 12.5  HCT 38.4 36.2 39.7 36  MCV 99.7 99.7 99*  --   PLT 318 296 361 239   Lab Results  Component Value Date   TSH 3.24 05/24/2020   Lab Results  Component Value Date   HGBA1C 5.6 06/26/2020   Lab Results  Component Value Date   CHOL 178 06/26/2020   HDL 54 06/26/2020   LDLCALC 109 (H) 06/26/2020   TRIG 75 06/26/2020   CHOLHDL 3.3 06/26/2020    Significant Diagnostic Results in last 30 days:  No results found.  Assessment/Plan 1. Impacted cerumen of right ear - dried blood in ear canal with mild redness - cannot visualize TM due to war buildup - carbamide peroxide (DEBROX) 6.5 % OTIC solution; Place 5 drops into the right ear 2 (two) times daily for 5 days.  Dispense: 15 mL; Refill: 0 - recommend flushing right ear with warm water when debrox completed    Family/ staff Communication: plan discussed with patient and nurse  Labs/tests ordered: none

## 2021-04-03 ENCOUNTER — Ambulatory Visit (HOSPITAL_COMMUNITY)
Admission: RE | Admit: 2021-04-03 | Discharge: 2021-04-03 | Disposition: A | Discharge status: Home / Self Care | Payer: Medicare Other | Source: Ambulatory Visit | Attending: Neurology | Admitting: Neurology

## 2021-04-03 ENCOUNTER — Other Ambulatory Visit: Payer: Self-pay

## 2021-04-03 ENCOUNTER — Telehealth: Payer: Self-pay | Admitting: Neurology

## 2021-04-03 DIAGNOSIS — I6789 Other cerebrovascular disease: Secondary | ICD-10-CM

## 2021-04-03 DIAGNOSIS — E785 Hyperlipidemia, unspecified: Secondary | ICD-10-CM | POA: Diagnosis not present

## 2021-04-03 DIAGNOSIS — G40209 Localization-related (focal) (partial) symptomatic epilepsy and epileptic syndromes with complex partial seizures, not intractable, without status epilepticus: Secondary | ICD-10-CM | POA: Diagnosis not present

## 2021-04-03 DIAGNOSIS — I10 Essential (primary) hypertension: Secondary | ICD-10-CM | POA: Diagnosis not present

## 2021-04-03 DIAGNOSIS — I639 Cerebral infarction, unspecified: Secondary | ICD-10-CM | POA: Insufficient documentation

## 2021-04-03 DIAGNOSIS — I340 Nonrheumatic mitral (valve) insufficiency: Secondary | ICD-10-CM | POA: Insufficient documentation

## 2021-04-03 DIAGNOSIS — S065XAA Traumatic subdural hemorrhage with loss of consciousness status unknown, initial encounter: Secondary | ICD-10-CM

## 2021-04-03 DIAGNOSIS — S065X9A Traumatic subdural hemorrhage with loss of consciousness of unspecified duration, initial encounter: Secondary | ICD-10-CM

## 2021-04-03 DIAGNOSIS — I679 Cerebrovascular disease, unspecified: Secondary | ICD-10-CM

## 2021-04-03 LAB — ECHOCARDIOGRAM COMPLETE
Area-P 1/2: 5.13 cm2
MV M vel: 4.7 m/s
MV Peak grad: 88.4 mmHg
S' Lateral: 2.9 cm

## 2021-04-03 NOTE — Telephone Encounter (Signed)
     1. Left ventricular ejection fraction, by estimation, is 60 to 65%. The left  Please call patient, echocardiogram showed no significant abnormalities.   ventricle has normal function. The left ventricle has no regional wall motion abnormalities. Left ventricular diastolic parameters are indeterminate. Elevated left ventricular end-diastolic pressure.  2. Right ventricular systolic function is normal. The right ventricular size is normal. There is mildly elevated pulmonary artery systolic pressure. The estimated right ventricular systolic pressure is 36.9 mmHg.  3. The mitral valve is normal in structure. Mild to moderate mitral valve regurgitation. No evidence of mitral stenosis.  4. The aortic valve is normal in structure. Aortic valve regurgitation is not visualized. No aortic stenosis is present.  5. The inferior vena cava is normal in size with <50% respiratory variability, suggesting right atrial pressure of 8 mmHg

## 2021-04-03 NOTE — Telephone Encounter (Signed)
I spoke to the patient's Chrissie Noa, on [place]. She verbalized understanding of the findings.

## 2021-05-09 ENCOUNTER — Non-Acute Institutional Stay: Payer: Medicare Other | Admitting: Internal Medicine

## 2021-05-09 ENCOUNTER — Other Ambulatory Visit: Payer: Self-pay

## 2021-05-09 ENCOUNTER — Encounter: Payer: Self-pay | Admitting: Internal Medicine

## 2021-05-09 VITALS — BP 162/98 | HR 77 | Temp 96.4°F | Ht 64.0 in | Wt 146.8 lb

## 2021-05-09 DIAGNOSIS — I10 Essential (primary) hypertension: Secondary | ICD-10-CM | POA: Diagnosis not present

## 2021-05-09 DIAGNOSIS — H3530 Unspecified macular degeneration: Secondary | ICD-10-CM

## 2021-05-09 DIAGNOSIS — G40209 Localization-related (focal) (partial) symptomatic epilepsy and epileptic syndromes with complex partial seizures, not intractable, without status epilepticus: Secondary | ICD-10-CM | POA: Diagnosis not present

## 2021-05-09 DIAGNOSIS — N1831 Chronic kidney disease, stage 3a: Secondary | ICD-10-CM

## 2021-05-09 DIAGNOSIS — S39012A Strain of muscle, fascia and tendon of lower back, initial encounter: Secondary | ICD-10-CM

## 2021-05-09 DIAGNOSIS — S065XAA Traumatic subdural hemorrhage with loss of consciousness status unknown, initial encounter: Secondary | ICD-10-CM

## 2021-05-09 DIAGNOSIS — S065X9A Traumatic subdural hemorrhage with loss of consciousness of unspecified duration, initial encounter: Secondary | ICD-10-CM | POA: Diagnosis not present

## 2021-05-11 NOTE — Progress Notes (Signed)
Location:  [facility] of Service:  Clinic (12)  Provider:   Code Status: DNR Goals of Care:  Advanced Directives 05/09/2021  Does Patient Have a Medical Advance Directive? Yes  Type of Estate agent of [place];Living will  Does patient want to make changes to medical advance directive? No - Patient declined  Copy of Healthcare Power of Attorney in Chart? No - copy requested     Chief Complaint  Patient presents with   Medical Management of Chronic Issues    Patient returns to the clinic for her 4 mont follow up.    Quality Metric Gaps    Shingrix, dexa scan, #4 covid, flu shot    HPI: Patient is a 87 y.o. female seen today for medical management of chronic diseases.    Patient has a history of Left subdural hematoma in 10/21 when she was admitted in the hospital with right arm weakness and slurred speech.  Etiology was unclear as she did not have any falls anticoagulation. 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 Has been on Keppra and has not had any issues Caregiver stress Does get emotional with her husband who is in memory care for worsening neurodegenerative disorder. Hypertension Blood pressure mildly elevated today Vision impairment Now is living in [place] feels more safe.  Does not use cane or a walker.  Has not had any falls.  Has 1 daughter in [place] and 1 in [place]  Past Medical History:  Diagnosis Date   Focal seizures (HCC)    Per [facility] new patient packet/ Right hand    Hearing loss    Hyperlipidemia    Hypertension    Kidney stone    Macular degeneration    Per [facility] new patient packet   Nephrolithiasis    SDH (subdural hematoma) (HCC)     Past Surgical History:  Procedure Laterality Date   CATARACT EXTRACTION  1997   Per [facility] new patient packet   KIDNEY STONE SURGERY     Per [facility] new patient packet   KIDNEY  STONE SURGERY  1959, 29, 46   41 CUFF REPAIR  2010   SHOULDER SURGERY  2008   [employer] amnesia  2020    Allergies  Allergen Reactions   Scallops [Shellfish Allergy] Hives and Shortness Of Breath   Sulfa Antibiotics Rash    Outpatient Encounter Medications as of 05/09/2021  Medication Sig   latanoprost (XALATAN) 0.005 % ophthalmic solution Place 1 drop into both eyes at bedtime.   levETIRAcetam (KEPPRA) 500 MG tablet Take 1 tablet (500 mg total) by mouth 2 (two) times daily.   loperamide (IMODIUM A-D) 2 MG tablet Take 2 mg by mouth as needed for diarrhea or loose stools.   metoprolol succinate (TOPROL-XL) 50 MG 24 hr tablet Take 1 tablet (50 mg total) by mouth daily. Take with or immediately following a meal.   Multiple Vitamins-Minerals (PRESERVISION AREDS) CAPS Take 1 capsule by mouth in the morning and at bedtime.   ramipril (ALTACE) 10 MG capsule Take 1 capsule (10 mg total) by mouth daily.   sennosides-docusate sodium (SENOKOT-S) 8.6-50 MG tablet Take 1 tablet by mouth as needed for constipation.   No facility-administered encounter medications on file as of 05/09/2021.    Review of Systems:  Review of Systems Review of Systems  Constitutional: Negative for activity change, appetite change, chills, diaphoresis, fatigue and fever.  HENT: Negative  for mouth sores, postnasal drip, rhinorrhea, sinus pain and sore throat.   Respiratory: Negative for apnea, cough, chest tightness, shortness of breath and wheezing.   Cardiovascular: Negative for chest pain, palpitations and leg swelling.  Gastrointestinal: Negative for abdominal distention, abdominal pain, constipation, diarrhea, nausea and vomiting.  Genitourinary: Negative for dysuria and frequency.  Musculoskeletal: Negative for arthralgias, joint swelling and myalgias.  Skin: Negative for rash.  Neurological: Negative for dizziness, syncope, weakness, light-headedness and numbness.  Psychiatric/Behavioral: Negative for  behavioral problems, confusion and sleep disturbance.   Health Maintenance  Topic Date Due   Zoster Vaccines- Shingrix (1 of 2) Never done   DEXA SCAN  Never done   COVID-19 Vaccine (4 - Booster for Moderna series) 12/06/2020   INFLUENZA VACCINE  04/23/2021   TETANUS/TDAP  01/11/2031   PNA vac Low Risk Adult  Completed   HPV VACCINES  Aged Out    Physical Exam: Vitals:   05/09/21 1019  BP: (!) 162/98  Pulse: 77  Temp: (!) 96.4 F (35.8 C)  SpO2: 100%  Weight: 146 lb 12.8 oz (66.6 kg)  Height: 5\' 4"  (1.626 m)   Body mass index is 25.2 kg/m. Physical Exam Constitutional: Oriented to person, place, and time. Well-developed and well-nourished.  HENT:  Head: Normocephalic.  Mouth/Throat: Oropharynx is clear and moist.  Ears TM normal Wax present in Right ear but no Impacted Eyes: Pupils are equal, round, and reactive to light.  Neck: Neck supple.  Cardiovascular: Normal rate and normal heart sounds.  No murmur heard. Pulmonary/Chest: Effort normal and breath sounds normal. No respiratory distress. No wheezes. She has no rales.  Abdominal: Soft. Bowel sounds are normal. No distension. There is no tenderness. There is no rebound.  Musculoskeletal: No edema.  Lymphadenopathy: none Neurological: Alert and oriented to person, place, and time.  Skin: Skin is warm and dry.  Psychiatric: Normal mood and affect. Behavior is normal. Thought content normal.   Labs reviewed: Basic Metabolic Panel: Recent Labs    05/24/20 0000 06/25/20 1232 06/27/20 0908 06/28/20 0410 07/05/20 1407  NA 134*   < > 129* 132* 131*  K 4.7   < > 5.1 4.5 5.1  CL 97*   < > 97* 101 94*  CO2 19   < > 22 20* 20  GLUCOSE  --    < > 106* 106* 105*  BUN  --    < > 14 16 14   CREATININE  --    < > 0.90 0.90 0.91  CALCIUM 10.5   < > 9.2 8.9 10.2  TSH 3.24  --   --   --   --    < > = values in this interval not displayed.   Liver Function Tests: Recent Labs    05/24/20 0000 06/25/20 1232  AST 17 21   ALT 16 15  ALKPHOS  --  51  BILITOT  --  1.3*  PROT  --  7.1  ALBUMIN 4.6 4.0   No results for input(s): LIPASE, AMYLASE in the last 8760 hours. No results for input(s): AMMONIA in the last 8760 hours. CBC: Recent Labs    06/25/20 1232 06/26/20 0253 07/05/20 1407 01/09/21 0000  WBC 5.4 5.6 5.0 3.1  NEUTROABS 3.1  --   --   --   HGB 12.8 12.5 13.2 12.5  HCT 38.4 36.2 39.7 36  MCV 99.7 99.7 99*  --   PLT 318 296 361 239   Lipid Panel: Recent Labs  05/24/20 0000 06/26/20 0253  CHOL 235* 178  HDL  --  54  LDLCALC  --  401*  TRIG 125 75  CHOLHDL  --  3.3   Lab Results  Component Value Date   HGBA1C 5.6 06/26/2020    Procedures since last visit: No results found.  Assessment/Plan 1. Primary hypertension On Metoprolol and Ramipril Check BP QD by Nurse and reval in 2 weeks  2. SDH (subdural hematoma) (HCC) Asymptomatic now Follows with Dr Terrace Arabia Work up so far negative for etiology  3. Partial symptomatic epilepsy with complex partial seizures, not intractable, without status epilepticus (HCC) Continue Kepprra  4. Stage 3a chronic kidney disease (HCC) Continue to follow  5. Macular degeneration of both eyes, unspecified type Lives in [place] now Doing well. Not driving Follows with Opthalmology  6. Strain of lumbar paraspinal muscle, initial encounter Doing her Exercises 7 Macrocytosis Will Follow with repeat Labs and B12 level  Will need Labs Before Next visit   Labs/tests ordered:  * No order type specified * Next appt:  09/10/2021

## 2021-05-29 ENCOUNTER — Other Ambulatory Visit: Payer: Self-pay | Admitting: Orthopedic Surgery

## 2021-05-29 ENCOUNTER — Non-Acute Institutional Stay: Payer: Medicare Other | Admitting: Orthopedic Surgery

## 2021-05-29 ENCOUNTER — Encounter: Payer: Self-pay | Admitting: Orthopedic Surgery

## 2021-05-29 DIAGNOSIS — I10 Essential (primary) hypertension: Secondary | ICD-10-CM

## 2021-05-29 MED ORDER — RAMIPRIL 2.5 MG PO CAPS: 2.5000 mg | ORAL_CAPSULE | Freq: Every day | ORAL | 3 refills | Status: DC

## 2021-05-29 NOTE — Addendum Note (Signed)
Addended byHazle Nordmann E on: 05/29/2021 07:12 PM   Modules accepted: Orders

## 2021-05-29 NOTE — Progress Notes (Signed)
Location:   [facility] Room Number: 623 Place of Service:  ALF [id number]) Provider:  Hazle Nordmann, NP    Patient Care Team: Mahlon Gammon, MD as PCP - General (Internal Medicine) Pa, [facility] as Consulting Physician (Neurosurgery) Levert Feinstein, MD as Consulting Physician (Neurology)  Extended Emergency Contact Information Primary Emergency Contact: Counihan,CECIL Address: [address]          [place], [place] [address] Darden [place] of [place] Home Phone: [phone] Relation: Spouse Secondary Emergency Contact: Renne Crigler Address: [address]          [place], [place] [address] Darden Amber of [place] Home Phone: [phone] Mobile Phone: [phone] Relation: Daughter  Code Status:  DNR Goals of care: Advanced Directive information Advanced Directives 05/29/2021  Does Patient Have a Medical Advance Directive? Yes  Type of Estate agent of [place];Living will  Does patient want to make changes to medical advance directive? No - Patient declined  Copy of Healthcare Power of Attorney in Chart? Yes - validated most recent copy scanned in chart (See row information)     Chief Complaint  Patient presents with   Acute Visit    Patient presents for increased blood pressure     HPI:  Pt is a 87 y.o. female seen today for an acute visit for increased blood pressure.   She currently resides on the assisted living unit at [facility]. Past medical history includes: cerebral vascular disease, HTN, constipation, CKD stage 3a, seizures, subdural hematoma and macular degeneration.   She was seen by Dr. Chales Abrahams 08/17, blood pressures were noted to be elevated. Blood pressures x 2 weeks ordered. Today, blood pressures remain elevated. She continues to take metoprolol and ramipril daily. Admits to salt in her diet, but does not add salt. Denies chest pain, sob, headaches or blurred vision.   Recent  blood pressure trends:  09/01- 150/81  08/31- 150/82  08/30- 158/87  08/29- 154/85  She admits to increased stress due to husbands recent hip fracture.   Nurse does not report any other concerns, vitals stable.   Past Medical History:  Diagnosis Date   Focal seizures (HCC)    Per [facility] new patient packet/ Right hand    Hearing loss    Hyperlipidemia    Hypertension    Kidney stone    Macular degeneration    Per [facility] new patient packet   Nephrolithiasis    SDH (subdural hematoma) (HCC)    Past Surgical History:  Procedure Laterality Date   CATARACT EXTRACTION  1997   Per [facility] new patient packet   KIDNEY STONE SURGERY     Per [facility] new patient packet   KIDNEY STONE SURGERY  1959, 45, 90   89 CUFF REPAIR  2010   SHOULDER SURGERY  2008   [employer] amnesia  2020    Allergies  Allergen Reactions   Scallops [Shellfish Allergy] Hives and Shortness Of Breath   Sulfa Antibiotics Rash    Allergies as of 05/29/2021       Reactions   Scallops [shellfish Allergy] Hives, Shortness Of Breath   Sulfa Antibiotics Rash        Medication List        Accurate as of May 29, 2021 11:37 AM. If you have any questions, ask your nurse or doctor.          latanoprost 0.005 % ophthalmic solution Commonly known as: XALATAN Place 1 drop into both  eyes at bedtime.   levETIRAcetam 500 MG tablet Commonly known as: KEPPRA Take 1 tablet (500 mg total) by mouth 2 (two) times daily.   loperamide 2 MG tablet Commonly known as: IMODIUM A-D Take 2 mg by mouth as needed for diarrhea or loose stools.   metoprolol succinate 50 MG 24 hr tablet Commonly known as: TOPROL-XL Take 1 tablet (50 mg total) by mouth daily. Take with or immediately following a meal.   PreserVision AREDS Caps Take 1 capsule by mouth in the morning and at bedtime.   ramipril 10 MG capsule Commonly known as: ALTACE Take 1 capsule (10 mg total) by mouth daily.   sennosides-docusate sodium 8.6-50 MG  tablet Commonly known as: SENOKOT-S Take 1 tablet by mouth as needed for constipation.        Review of Systems  Constitutional:  Negative for activity change, appetite change, fatigue and fever.  Eyes:  Negative for photophobia and visual disturbance.       Hx: macular degeneration  Respiratory:  Negative for cough, shortness of breath and wheezing.   Cardiovascular:  Negative for chest pain and leg swelling.  Neurological:  Negative for dizziness and headaches.  Psychiatric/Behavioral:  Negative for dysphoric mood. The patient is not nervous/anxious.        Increased stress   Immunization History  Administered Date(s) Administered   Fluad Quad(high Dose 65+) 06/29/2020   Moderna Sars-Covid-2 Vaccination 10/01/2019, 10/29/2019, 08/08/2020   Pneumococcal Conjugate-13 09/24/2015   Pneumococcal Polysaccharide-23 09/23/2017   Tetanus 01/10/2021   Pertinent  Health Maintenance Due  Topic Date Due   DEXA SCAN  Never done   INFLUENZA VACCINE  04/23/2021   PNA vac Low Risk Adult  Completed   Fall Risk  05/09/2021 01/08/2021 07/26/2020  Falls in the past year? 0 0 0  Number falls in past yr: 0 0 0  Injury with Fall? - - 0  Follow up Falls evaluation completed - -   Functional Status Survey:    Vitals:   05/29/21 1134  BP: 108/69  Pulse: 70  Resp: 18  Temp: (!) 97 F (36.1 C)  SpO2: 96%  Weight: 143 lb 6.4 oz (65 kg)  Height: 5\' 4"  (1.626 m)   Body mass index is 24.61 kg/m. Physical Exam Vitals reviewed.  Constitutional:      General: She is not in acute distress. HENT:     Head: Normocephalic.  Cardiovascular:     Rate and Rhythm: Normal rate and regular rhythm.     Pulses: Normal pulses.     Heart sounds: Normal heart sounds. No murmur heard. Pulmonary:     Effort: Pulmonary effort is normal. No respiratory distress.     Breath sounds: Normal breath sounds. No wheezing.  Skin:    General: Skin is warm and dry.     Capillary Refill: Capillary refill takes  less than 2 seconds.  Neurological:     General: No focal deficit present.     Mental Status: She is alert and oriented to person, place, and time.  Psychiatric:        Mood and Affect: Mood normal.        Behavior: Behavior normal.    Labs reviewed: Recent Labs    06/27/20 0908 06/28/20 0410 07/05/20 1407  NA 129* 132* 131*  K 5.1 4.5 5.1  CL 97* 101 94*  CO2 22 20* 20  GLUCOSE 106* 106* 105*  BUN 14 16 14   CREATININE 0.90 0.90 0.91  CALCIUM  9.2 8.9 10.2   Recent Labs    06/25/20 1232  AST 21  ALT 15  ALKPHOS 51  BILITOT 1.3*  PROT 7.1  ALBUMIN 4.0   Recent Labs    06/25/20 1232 06/26/20 0253 07/05/20 1407 01/09/21 0000  WBC 5.4 5.6 5.0 3.1  NEUTROABS 3.1  --   --   --   HGB 12.8 12.5 13.2 12.5  HCT 38.4 36.2 39.7 36  MCV 99.7 99.7 99*  --   PLT 318 296 361 239   Lab Results  Component Value Date   TSH 3.24 05/24/2020   Lab Results  Component Value Date   HGBA1C 5.6 06/26/2020   Lab Results  Component Value Date   CHOL 178 06/26/2020   HDL 54 06/26/2020   LDLCALC 109 (H) 06/26/2020   TRIG 75 06/26/2020   CHOLHDL 3.3 06/26/2020    Significant Diagnostic Results in last 30 days:  No results found.  Assessment/Plan 1. Primary hypertension - uncontrolled - BUN/creat 13.2/0.81 01/09/2021 - will increase ramipril to 12.5 mg daily - manual blood pressures daily, 2 hours after bp meds x 7 days - report blood pressures after one week to provider - cont metoprolol  - cont low sodium diet - discussed stress reducing measures like meditation    Family/ staff Communication: plan discussed with patient and nurse  Labs/tests ordered:  none

## 2021-06-06 NOTE — Progress Notes (Signed)
Chart reviewed, agree above plan ?

## 2021-09-10 ENCOUNTER — Encounter: Payer: Medicare Other | Admitting: Adult Health

## 2021-10-01 ENCOUNTER — Non-Acute Institutional Stay: Payer: Medicare Other | Admitting: Internal Medicine

## 2021-10-01 ENCOUNTER — Other Ambulatory Visit: Payer: Self-pay

## 2021-10-01 VITALS — BP 128/70 | HR 66 | Temp 97.5°F | Ht 64.0 in | Wt 148.0 lb

## 2021-10-01 DIAGNOSIS — H3530 Unspecified macular degeneration: Secondary | ICD-10-CM

## 2021-10-01 DIAGNOSIS — G40209 Localization-related (focal) (partial) symptomatic epilepsy and epileptic syndromes with complex partial seizures, not intractable, without status epilepticus: Secondary | ICD-10-CM

## 2021-10-01 DIAGNOSIS — I10 Essential (primary) hypertension: Secondary | ICD-10-CM

## 2021-10-01 DIAGNOSIS — N1831 Chronic kidney disease, stage 3a: Secondary | ICD-10-CM | POA: Diagnosis not present

## 2021-10-01 DIAGNOSIS — S065XAA Traumatic subdural hemorrhage with loss of consciousness status unknown, initial encounter: Secondary | ICD-10-CM

## 2021-10-01 DIAGNOSIS — S39012A Strain of muscle, fascia and tendon of lower back, initial encounter: Secondary | ICD-10-CM

## 2021-10-01 NOTE — Progress Notes (Signed)
Location:  [facility] features editor of Service:  Clinic (12)  Provider:   Code Status:  Goals of Care:  Advanced Directives 05/29/2021  Does Patient Have a Medical Advance Directive? Yes  Type of Estate agent of [place];Living will  Does patient want to make changes to medical advance directive? No - Patient declined  Copy of Healthcare Power of Attorney in Chart? Yes - validated most recent copy scanned in chart (See row information)     Chief Complaint  Patient presents with   Medical Management of Chronic Issues    Patient returns to the clinic for her 4 month.    Quality Metric Gaps    Zoster Vaccines- Shingrix (1 of 2)  DEXA SCAN (Once) 2022 COVID-19 Vaccine (4 - Booster for Moderna series)       HPI: Patient is a 88 y.o. female seen today for medical management of chronic diseases.    Patient has a history of Left subdural hematoma in 10/21 when she was admitted in the hospital with right arm weakness and slurred speech.  Etiology was unclear as she did not have any falls anticoagulation. Since then her work-up including the echocardiogram has been negative   Complex partial seizures Diagnosed after her hematoma. Has been on Keppra and has not had any issues Caregiver stress with her husband who is in memory care for worsening neurodegenerative disorder. 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 Does not drive anymore   Past Medical History:  Diagnosis Date   Focal seizures (HCC)    Per [facility] new patient packet/ Right hand    Hearing loss    Hyperlipidemia    Hypertension    Kidney stone    Macular degeneration    Per [facility] new patient packet   Nephrolithiasis    SDH (subdural hematoma) (HCC)     Past Surgical History:  Procedure Laterality Date   CATARACT EXTRACTION  1997   Per [facility] new  patient packet   KIDNEY STONE SURGERY     Per [facility] new patient packet   KIDNEY STONE SURGERY  1959, 70, 47   48 CUFF REPAIR  2010   SHOULDER SURGERY  2008   [employer] amnesia  2020    Allergies  Allergen Reactions   Scallops [Shellfish Allergy] Hives and Shortness Of Breath   Sulfa Antibiotics Rash    Outpatient Encounter Medications as of 10/01/2021  Medication Sig   latanoprost (XALATAN) 0.005 % ophthalmic solution Place 1 drop into both eyes at bedtime.   levETIRAcetam (KEPPRA) 500 MG tablet Take 1 tablet (500 mg total) by mouth 2 (two) times daily.   loperamide (IMODIUM A-D) 2 MG tablet Take 2 mg by mouth as needed for diarrhea or loose stools.   metoprolol succinate (TOPROL-XL) 50 MG 24 hr tablet Take 1 tablet (50 mg total) by mouth daily. Take with or immediately following a meal.   Multiple Vitamins-Minerals (PRESERVISION AREDS) CAPS Take 1 capsule by mouth in the morning and at bedtime.   ramipril (ALTACE) 10 MG capsule Take 1 capsule (10 mg total) by mouth daily.   ramipril (ALTACE) 2.5 MG capsule Take 1 capsule (2.5 mg total) by mouth daily. Take with ramipril 10 mg daily   sennosides-docusate sodium (SENOKOT-S) 8.6-50 MG tablet Take 1 tablet by mouth as needed for constipation.   No facility-administered  encounter medications on file as of 10/01/2021.    Review of Systems:  Review of Systems  Constitutional:  Negative for activity change and appetite change.  HENT: Negative.    Respiratory:  Negative for cough and shortness of breath.   Cardiovascular:  Negative for leg swelling.  Gastrointestinal:  Negative for constipation.  Genitourinary: Negative.   Musculoskeletal:  Negative for arthralgias, gait problem and myalgias.  Skin: Negative.   Neurological:  Negative for dizziness and weakness.  Psychiatric/Behavioral:  Negative for confusion, dysphoric mood and sleep disturbance.    Health Maintenance  Topic Date Due   Zoster Vaccines- Shingrix (1 of 2)  Never done   DEXA SCAN  Never done   COVID-19 Vaccine (4 - Booster for Moderna series) 10/03/2020   TETANUS/TDAP  01/11/2031   Pneumonia Vaccine 59+ Years old  Completed   INFLUENZA VACCINE  Completed   HPV VACCINES  Aged Out    Physical Exam: Vitals:   10/01/21 1158  BP: 128/70  Pulse: 66  Temp: (!) 97.5 F (36.4 C)  SpO2: 100%  Weight: 148 lb (67.1 kg)  Height: 5\' 4"  (1.626 m)   Body mass index is 25.4 kg/m. Physical Exam Vitals reviewed.  Constitutional:      Appearance: Normal appearance.  HENT:     Head: Normocephalic.     Nose: Nose normal.     Mouth/Throat:     Mouth: Mucous membranes are moist.     Pharynx: Oropharynx is clear.  Eyes:     Pupils: Pupils are equal, round, and reactive to light.  Cardiovascular:     Rate and Rhythm: Normal rate and regular rhythm.     Pulses: Normal pulses.     Heart sounds: Normal heart sounds. No murmur heard. Pulmonary:     Effort: Pulmonary effort is normal.     Breath sounds: Normal breath sounds.  Abdominal:     General: Abdomen is flat. Bowel sounds are normal.     Palpations: Abdomen is soft.  Musculoskeletal:        General: No swelling.     Cervical back: Neck supple.  Skin:    General: Skin is warm.  Neurological:     General: No focal deficit present.     Mental Status: She is alert and oriented to person, place, and time.  Psychiatric:        Mood and Affect: Mood normal.        Thought Content: Thought content normal.    Labs reviewed: Basic Metabolic Panel: No results for input(s): NA, K, CL, CO2, GLUCOSE, BUN, CREATININE, CALCIUM, MG, PHOS, TSH in the last 8760 hours. Liver Function Tests: No results for input(s): AST, ALT, ALKPHOS, BILITOT, PROT, ALBUMIN in the last 8760 hours. No results for input(s): LIPASE, AMYLASE in the last 8760 hours. No results for input(s): AMMONIA in the last 8760 hours. CBC: Recent Labs    01/09/21 0000  WBC 3.1  HGB 12.5  HCT 36  PLT 239   Lipid Panel: No  results for input(s): CHOL, HDL, LDLCALC, TRIG, CHOLHDL, LDLDIRECT in the last 8760 hours. Lab Results  Component Value Date   HGBA1C 5.6 06/26/2020    Procedures since last visit: No results found.  Assessment/Plan Primary hypertension Continues to be stable on Metoprolol and Ramipril SDH (subdural hematoma) Follows with Neurology Work up so far negative Partial symptomatic epilepsy with complex partial seizures,  On Keppra Stage 3a chronic kidney disease (HCC) Creat stable Macular degeneration of both eyes,  unspecified type [place] Not [place] anymore Follows with Opthalmology Strain of lumbar paraspinal muscle, Pain is now better   Labs/tests ordered:  CBC,CMP,Lipid B 12 levels Next appt:  01/28/2022

## 2021-10-01 NOTE — Patient Instructions (Signed)
The nurse will call you to schedule your lab apnt.

## 2022-01-25 LAB — LIPID PANEL
Cholesterol: 217 — AB (ref 0–200)
HDL: 61 (ref 35–70)
LDL Cholesterol: 141
LDl/HDL Ratio: 3.6
Triglycerides: 80 (ref 40–160)

## 2022-01-25 LAB — HEPATIC FUNCTION PANEL
ALT: 13 U/L (ref 7–35)
AST: 17 (ref 13–35)
Alkaline Phosphatase: 79 (ref 25–125)
Bilirubin, Total: 0.3

## 2022-01-25 LAB — TSH: TSH: 8.17 — AB (ref 0.41–5.90)

## 2022-01-25 LAB — BASIC METABOLIC PANEL
BUN: 13 (ref 4–21)
CO2: 25 — AB (ref 13–22)
Chloride: 95 — AB (ref 99–108)
Creatinine: 0.9 (ref 0.5–1.1)
Glucose: 104
Potassium: 4.6 mEq/L (ref 3.5–5.1)
Sodium: 131 — AB (ref 137–147)

## 2022-01-25 LAB — CBC AND DIFFERENTIAL
HCT: 39 (ref 36–46)
Hemoglobin: 13.1 (ref 12.0–16.0)
Platelets: 247 10*3/uL (ref 150–400)
WBC: 3.9

## 2022-01-25 LAB — COMPREHENSIVE METABOLIC PANEL
Albumin: 4.3 (ref 3.5–5.0)
Calcium: 9.9 (ref 8.7–10.7)
Globulin: 2.2

## 2022-01-25 LAB — VITAMIN B12: Vitamin B-12: 192

## 2022-01-25 LAB — CBC: RBC: 3.94 (ref 3.87–5.11)

## 2022-01-28 ENCOUNTER — Encounter: Payer: Self-pay | Admitting: Adult Health

## 2022-01-28 ENCOUNTER — Non-Acute Institutional Stay: Payer: Medicare Other | Admitting: Adult Health

## 2022-01-28 VITALS — BP 134/78 | HR 84 | Temp 97.5°F | Ht 64.0 in | Wt 150.0 lb

## 2022-01-28 DIAGNOSIS — S065XAA Traumatic subdural hemorrhage with loss of consciousness status unknown, initial encounter: Secondary | ICD-10-CM | POA: Diagnosis not present

## 2022-01-28 DIAGNOSIS — I10 Essential (primary) hypertension: Secondary | ICD-10-CM

## 2022-01-28 DIAGNOSIS — E038 Other specified hypothyroidism: Secondary | ICD-10-CM

## 2022-01-28 DIAGNOSIS — H3530 Unspecified macular degeneration: Secondary | ICD-10-CM

## 2022-01-28 DIAGNOSIS — E782 Mixed hyperlipidemia: Secondary | ICD-10-CM

## 2022-01-28 DIAGNOSIS — G40209 Localization-related (focal) (partial) symptomatic epilepsy and epileptic syndromes with complex partial seizures, not intractable, without status epilepticus: Secondary | ICD-10-CM

## 2022-01-28 DIAGNOSIS — E538 Deficiency of other specified B group vitamins: Secondary | ICD-10-CM | POA: Diagnosis not present

## 2022-01-28 MED ORDER — VITAMIN B-12 1000 MCG PO TABS: 1000.0000 ug | ORAL_TABLET | Freq: Every day | ORAL | 3 refills | Status: DC

## 2022-01-28 NOTE — Progress Notes (Addendum)
? ?Location:  [facility] ? ?POS: Clinic ? ?Provider: Fletcher Anon, ANP ? ?Code Status: DNR ?Goals of Care:  ? ?  01/28/2022  ?  2:03 PM  ?Advanced Directives  ?Does Patient Have a Medical Advance Directive? Yes  ?Type of Advance Directive Living will  ?Does patient want to make changes to medical advance directive? No - Patient declined  ? ? ? ?Chief Complaint  ?Patient presents with  ? Medical Management of Chronic Issues  ?  Patient returns to the clinic for her 4 month follow up.   ? Quality Metric Gaps  ?  Verified [facility] and [facility] patient is due for shingrix, dexa and #5 covid vaccine.  ? ? ?HPI: Patient is a 88 y.o. female seen today for medical management of chronic diseases.   ?[place], husband retired physician with memory loss and resides in the memory care unit ? ?Hx of left SDH (not on anticoagulation and no fall) and complex partial seizure in 2021. Continues on Keppra and followed by neurology with no new issues. ? 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 Going to ophthalmology soon for field of vision test.  ? ?HTN 134/78 ? ?HLD LDL 141, no hx of CVA or MI ? ?MMSE: 30/30 2021, has AWV scheduled for repeat MMSE ? ?TSH 8.17 01/25/22 ? ?B 12 192 MCV 98.6 Hhb 13.1 01/25/22 ? ?LDL 141 TC 217 01/25/22 ? ?Average weight for the past 10 years is 145-155 ?Weighed 148 prior to this was stressed out and lost weight so she is now gaining back.  ?Wt Readings from Last 3 Encounters:  ?01/28/22 150 lb (68 kg)  ?10/01/21 148 lb (67.1 kg)  ?05/29/21 143 lb 6.4 oz (65 kg)  ? ?Exercises with memory care residents 4 times a week ?Past Medical History:  ?Diagnosis Date  ? Focal seizures (HCC)   ? Per [facility] new patient packet/ Right hand   ? Hearing loss   ? Hyperlipidemia   ? Hypertension   ? Kidney stone   ? Macular degeneration   ? Per [facility] new patient packet  ? Nephrolithiasis   ? SDH (subdural hematoma) (HCC)   ? ? ?Past Surgical History:  ?Procedure Laterality Date  ? CATARACT EXTRACTION  1997  ? Per  [facility] new patient [facility]  ? KIDNEY STONE SURGERY    ? Per [facility] new patient packet  ? KIDNEY STONE SURGERY  1959, 1960, 1971  ? ROTATOR CUFF REPAIR  2010  ? SHOULDER SURGERY  2008  ? [employer] amnesia  2020  ? ? ?Allergies  ?Allergen Reactions  ? Scallops [Shellfish Allergy] Hives and Shortness Of Breath  ? Sulfa Antibiotics Rash  ? ? ?Outpatient Encounter Medications as of 01/28/2022  ?Medication Sig  ? latanoprost (XALATAN) 0.005 % ophthalmic solution Place 1 drop into both eyes at bedtime.  ? levETIRAcetam (KEPPRA) 500 MG tablet Take 1 tablet (500 mg total) by mouth 2 (two) times daily.  ? metoprolol succinate (TOPROL-XL) 50 MG 24 hr tablet Take 1 tablet (50 mg total) by mouth daily. Take with or immediately following a meal.  ? Multiple Vitamins-Minerals (PRESERVISION AREDS) CAPS Take 1 capsule by mouth in the morning and at bedtime.  ? ramipril (ALTACE) 10 MG capsule Take 1 capsule (10 mg total) by mouth daily.  ? ramipril (ALTACE) 2.5 MG capsule Take 1 capsule (2.5 mg total) by mouth daily. Take with ramipril 10 mg daily  ? sennosides-docusate sodium (SENOKOT-S) 8.6-50 MG tablet Take 1 tablet by mouth  as needed for constipation.  ? [DISCONTINUED] vitamin B-12 (CYANOCOBALAMIN) 1000 MCG tablet Take 1 tablet (1,000 mcg total) by mouth daily.  ? [DISCONTINUED] loperamide (IMODIUM A-D) 2 MG tablet Take 2 mg by mouth as needed for diarrhea or loose stools.  ? ?No facility-administered encounter medications on file as of 01/28/2022.  ? ? ?Review of Systems:  ?Review of Systems  ?Constitutional:  Negative for activity change, appetite change, chills, diaphoresis, fatigue, fever and unexpected weight change.  ?HENT:  Negative for congestion.   ?Respiratory:  Negative for cough, shortness of breath and wheezing.   ?Cardiovascular:  Negative for chest pain, palpitations and leg swelling.  ?Gastrointestinal:  Negative for abdominal distention, abdominal pain, constipation and diarrhea.  ?Genitourinary:  Negative for difficulty  urinating and dysuria.  ?Musculoskeletal:  Negative for arthralgias, back pain, gait problem, joint swelling and myalgias.  ?Neurological:  Negative for dizziness, tremors, seizures, syncope, facial asymmetry, speech difficulty, weakness, light-headedness, numbness and headaches.  ?Psychiatric/Behavioral:  Negative for agitation, behavioral problems and confusion.   ? ?Health Maintenance  ?Topic Date Due  ? Zoster Vaccines- Shingrix (1 of 2) Never done  ? DEXA SCAN  Never done  ? COVID-19 Vaccine (5 - Booster for Moderna series) 09/18/2021  ? INFLUENZA VACCINE  04/23/2022  ? TETANUS/TDAP  01/11/2031  ? Pneumonia Vaccine 6565+ Years old  Completed  ? HPV VACCINES  Aged Out  ? ? ?Physical Exam: ?Vitals:  ? 01/28/22 1353  ?BP: 134/78  ?Pulse: 84  ?Temp: (!) 97.5 ?F (36.4 ?C)  ?SpO2: 97%  ?Weight: 150 lb (68 kg)  ?Height: 5\' 4"  (1.626 m)  ? ?Body mass index is 25.75 kg/m?Marland Kitchen. ?Physical Exam ?Vitals and nursing note reviewed.  ?Constitutional:   ?   General: She is not in acute distress. ?   Appearance: She is not diaphoretic.  ?HENT:  ?   Head: Normocephalic and atraumatic.  ?   Right Ear: Tympanic membrane normal.  ?   Left Ear: Tympanic membrane normal.  ?   Nose: Nose normal.  ?   Mouth/Throat:  ?   Mouth: Mucous membranes are moist.  ?   Pharynx: Oropharynx is clear.  ?Eyes:  ?   Conjunctiva/sclera: Conjunctivae normal.  ?   Pupils: Pupils are equal, round, and reactive to light.  ?Neck:  ?   Vascular: No JVD.  ?Cardiovascular:  ?   Rate and Rhythm: Normal rate and regular rhythm.  ?   Heart sounds: No murmur heard. ?Pulmonary:  ?   Effort: Pulmonary effort is normal. No respiratory distress.  ?   Breath sounds: Normal breath sounds. No wheezing.  ?Abdominal:  ?   General: Bowel sounds are normal. There is no distension.  ?   Palpations: Abdomen is soft.  ?   Tenderness: There is no abdominal tenderness.  ?Musculoskeletal:  ?   Right lower leg: No edema.  ?   Left lower leg: No edema.  ?Skin: ?   General: Skin is warm  and dry.  ?Neurological:  ?   Mental Status: She is alert and oriented to person, place, and time.  ?Psychiatric:     ?   Mood and Affect: Mood normal.  ? ? ?Labs reviewed: ?Basic Metabolic Panel: ?Recent Labs  ?  01/25/22 ?0000  ?NA 131*  ?K 4.6  ?CL 95*  ?CO2 25*  ?BUN 13  ?CREATININE 0.9  ?CALCIUM 9.9  ?TSH 8.17*  ? ?Liver Function Tests: ?Recent Labs  ?  01/25/22 ?0000  ?AST 17  ?  ALT 13  ?ALKPHOS 79  ?ALBUMIN 4.3  ? ?No results for input(s): LIPASE, AMYLASE in the last 8760 hours. ?No results for input(s): AMMONIA in the last 8760 hours. ?CBC: ?Recent Labs  ?  01/25/22 ?0000  ?WBC 3.9  ?HGB 13.1  ?HCT 39  ?PLT 247  ? ?Lipid Panel: ?Recent Labs  ?  01/25/22 ?0000  ?CHOL 217*  ?HDL 61  ?LDLCALC 141  ?TRIG 80  ? ?Lab Results  ?Component Value Date  ? HGBA1C 5.6 06/26/2020  ? ? ?Procedures since last visit: ?No results found. ? ?Assessment/Plan ? ?1. B12 deficiency ?Start B12 1000 mcg ? ?2. Subclinical hypothyroidism ?No current symptoms ?Will recheck in 3 months  ? ?3. Primary hypertension ?Controlled ?Continue altace and metoprolol ? ?4. SDH (subdural hematoma) (HCC) ?2021 ?Followed by neurology, no acute issues.  ? ?5. Partial symptomatic epilepsy with complex partial seizures, not intractable, without status epilepticus (HCC) ?No new seizures ?Continue Jeanice Lim ? ?6. Macular degeneration of both eyes, unspecified type ?Blind right eye ?Follows with ophthalmology ? ?7. Mixed hyperlipidemia ?LDL 141 ?No hx of CAD and CVA ?Continue to monitor at this time ?Heart healthy diet.  ? ?Labs/tests ordered:  * No order type specified * TSH 3 months  ?Next appt:  3 months with Dr Reece Agar ? ? ?Total time :  time greater than 50% of total time spent doing pt counseling and coordination of care  ?  ? ?  ?

## 2022-01-30 ENCOUNTER — Encounter: Payer: Self-pay | Admitting: Adult Health

## 2022-02-19 ENCOUNTER — Encounter: Payer: Self-pay | Admitting: Nurse Practitioner

## 2022-02-19 ENCOUNTER — Ambulatory Visit (INDEPENDENT_AMBULATORY_CARE_PROVIDER_SITE_OTHER): Payer: Medicare Other | Admitting: Nurse Practitioner

## 2022-02-19 DIAGNOSIS — Z0000 Encounter for general adult medical examination without abnormal findings: Secondary | ICD-10-CM | POA: Diagnosis not present

## 2022-02-19 NOTE — Progress Notes (Signed)
This service is provided via telemedicine  No vital signs collected/recorded due to the encounter was a telemedicine visit.   Location of patient (ex: home, work):  Home  Patient consents to a telephone visit:  Yes, see encounter dated 02/19/2022  Location of the provider (ex: office, home):  [place]  Name of any referring provider:  Veleta Miners, MD  Names of all persons participating in the telemedicine service and their role in the encounter:  Sherrie Mustache, Nurse Practitioner, Carroll Kinds, CMA, and patient.   Time spent on call:  12 minutes with medical assistant

## 2022-02-19 NOTE — Patient Instructions (Signed)
Nichole Cox , Thank you for taking time to come for your Medicare Wellness Visit. I appreciate your ongoing commitment to your health goals. Please review the following plan we discussed and let me know if I can assist you in the future.   Screening recommendations/referrals: Colonoscopy aged out Mammogram aged out Bone Density declined Recommended yearly ophthalmology/optometry visit for glaucoma screening and checkup Recommended yearly dental visit for hygiene and checkup  Vaccinations: Influenza vaccine up to date Pneumococcal vaccine up to date Tdap vaccine up to date Shingles vaccine DUE- recommend to get at your local pharmacy       Advanced directives: on file.   Conditions/risks identified: advance age, worsening vision.  Next appointment: yearly    Preventive Care 58 Years and Older, Female Preventive care refers to lifestyle choices and visits with your health care provider that can promote health and wellness. What does preventive care include? A yearly physical exam. This is also called an annual well check. Dental exams once or twice a year. Routine eye exams. Ask your health care provider how often you should have your eyes checked. Personal lifestyle choices, including: Daily care of your teeth and gums. Regular physical activity. Eating a healthy diet. Avoiding tobacco and drug use. Limiting alcohol use. Practicing safe sex. Taking low-dose aspirin every day. Taking vitamin and mineral supplements as recommended by your health care provider. What happens during an annual well check? The services and screenings done by your health care provider during your annual well check will depend on your age, overall health, lifestyle risk factors, and family history of disease. Counseling  Your health care provider may ask you questions about your: Alcohol use. Tobacco use. Drug use. Emotional well-being. Home and relationship well-being. Sexual activity. Eating  habits. History of falls. Memory and ability to understand (cognition). Work and work Astronomer. Reproductive health. Screening  You may have the following tests or measurements: Height, weight, and BMI. Blood pressure. Lipid and cholesterol levels. These may be checked every 5 years, or more frequently if you are over 52 years old. Skin check. Lung cancer screening. You may have this screening every year starting at age 27 if you have a 30-pack-year history of smoking and currently smoke or have quit within the past 15 years. Fecal occult blood test (FOBT) of the stool. You may have this test every year starting at age 55. Flexible sigmoidoscopy or colonoscopy. You may have a sigmoidoscopy every 5 years or a colonoscopy every 10 years starting at age 32. Hepatitis C blood test. Hepatitis B blood test. Sexually transmitted disease (STD) testing. Diabetes screening. This is done by checking your blood sugar (glucose) after you have not eaten for a while (fasting). You may have this done every 1-3 years. Bone density scan. This is done to screen for osteoporosis. You may have this done starting at age 98. Mammogram. This may be done every 1-2 years. Talk to your health care provider about how often you should have regular mammograms. Talk with your health care provider about your test results, treatment options, and if necessary, the need for more tests. Vaccines  Your health care provider may recommend certain vaccines, such as: Influenza vaccine. This is recommended every year. Tetanus, diphtheria, and acellular pertussis (Tdap, Td) vaccine. You may need a Td booster every 10 years. Zoster vaccine. You may need this after age 43. Pneumococcal 13-valent conjugate (PCV13) vaccine. One dose is recommended after age 66. Pneumococcal polysaccharide (PPSV23) vaccine. One dose is recommended after age  87. Talk to your health care provider about which screenings and vaccines you need and how  often you need them. This information is not intended to replace advice given to you by your health care provider. Make sure you discuss any questions you have with your health care provider. Document Released: 10/06/2015 Document Revised: 05/29/2016 Document Reviewed: 07/11/2015 Elsevier Interactive Patient Education  2017 [place] Prevention in the Home Falls can cause injuries. They can happen to people of all ages. There are many things you can do to make your home safe and to help prevent falls. What can I do on the outside of my home? Regularly fix the edges of walkways and driveways and fix any cracks. Remove anything that might make you trip as you walk through a door, such as a raised step or threshold. Trim any bushes or trees on the path to your home. Use bright outdoor lighting. Clear any walking paths of anything that might make someone trip, such as rocks or tools. Regularly check to see if handrails are loose or broken. Make sure that both sides of any steps have handrails. Any raised decks and porches should have guardrails on the edges. Have any leaves, snow, or ice cleared regularly. Use sand or salt on walking paths during winter. Clean up any spills in your garage right away. This includes oil or grease spills. What can I do in the bathroom? Use night lights. Install grab bars by the toilet and in the tub and shower. Do not use towel bars as grab bars. Use non-skid mats or decals in the tub or shower. If you need to sit down in the shower, use a plastic, non-slip stool. Keep the floor dry. Clean up any water that spills on the floor as soon as it happens. Remove soap buildup in the tub or shower regularly. Attach bath mats securely with double-sided non-slip rug tape. Do not have throw rugs and other things on the floor that can make you trip. What can I do in the bedroom? Use night lights. Make sure that you have a light by your bed that is easy to  reach. Do not use any sheets or blankets that are too big for your bed. They should not hang down onto the floor. Have a firm chair that has side arms. You can use this for support while you get dressed. Do not have throw rugs and other things on the floor that can make you trip. What can I do in the kitchen? Clean up any spills right away. Avoid walking on wet floors. Keep items that you use a lot in easy-to-reach places. If you need to reach something above you, use a strong step stool that has a grab bar. Keep electrical cords out of the way. Do not use floor polish or wax that makes floors slippery. If you must use wax, use non-skid floor wax. Do not have throw rugs and other things on the floor that can make you trip. What can I do with my stairs? Do not leave any items on the stairs. Make sure that there are handrails on both sides of the stairs and use them. Fix handrails that are broken or loose. Make sure that handrails are as long as the stairways. Check any carpeting to make sure that it is firmly attached to the stairs. Fix any carpet that is loose or worn. Avoid having throw rugs at the top or bottom of the stairs. If you do have  throw rugs, attach them to the floor with carpet tape. Make sure that you have a light switch at the top of the stairs and the bottom of the stairs. If you do not have them, ask someone to add them for you. What else can I do to help prevent falls? Wear shoes that: Do not have high heels. Have rubber bottoms. Are comfortable and fit you well. Are closed at the toe. Do not wear sandals. If you use a stepladder: Make sure that it is fully opened. Do not climb a closed stepladder. Make sure that both sides of the stepladder are locked into place. Ask someone to hold it for you, if possible. Clearly mark and make sure that you can see: Any grab bars or handrails. First and last steps. Where the edge of each step is. Use tools that help you move  around (mobility aids) if they are needed. These include: Canes. Walkers. Scooters. Crutches. Turn on the lights when you go into a dark area. Replace any light bulbs as soon as they burn out. Set up your furniture so you have a clear path. Avoid moving your furniture around. If any of your floors are uneven, fix them. If there are any pets around you, be aware of where they are. Review your medicines with your doctor. Some medicines can make you feel dizzy. This can increase your chance of falling. Ask your doctor what other things that you can do to help prevent falls. This information is not intended to replace advice given to you by your health care provider. Make sure you discuss any questions you have with your health care provider. Document Released: 07/06/2009 Document Revised: 02/15/2016 Document Reviewed: 10/14/2014 Elsevier Interactive Patient Education  2017 [employer].

## 2022-02-19 NOTE — Progress Notes (Signed)
Subjective:   Nichole Cox is a 88 y.o. female who presents for Medicare Annual (Subsequent) preventive examination.  Review of Systems     Cardiac Risk Factors include: advanced age (>54men, >56 women);hypertension;dyslipidemia     Objective:    There were no vitals filed for this visit. There is no height or weight on file to calculate BMI.     02/19/2022   11:14 AM 01/28/2022    2:03 PM 05/29/2021   11:37 AM 05/09/2021    9:49 AM 03/27/2021    2:52 PM 07/26/2020    2:34 PM  Advanced Directives  Does Patient Have a Medical Advance Directive? Yes Yes Yes Yes Yes Yes  Type of Estate agent of [place];Living will Living will Healthcare Power of [place];Living will Healthcare Power of [place];Living will Healthcare Power of [place];Living will Healthcare Power of [place];Living will  Does patient want to make changes to medical advance directive? No - Patient declined No - Patient declined No - Patient declined No - Patient declined No - Patient declined No - Patient declined  Copy of Healthcare Power of Attorney in Chart? Yes - validated most recent copy scanned in chart (See row information)  Yes - validated most recent copy scanned in chart (See row information) No - copy requested No - copy requested No - copy requested    Current Medications (verified) Outpatient Encounter Medications as of 02/19/2022  Medication Sig   Cyanocobalamin (VITAMIN B 12 PO) Take by mouth.   latanoprost (XALATAN) 0.005 % ophthalmic solution Place 1 drop into both eyes at bedtime.   levETIRAcetam (KEPPRA) 500 MG tablet Take 1 tablet (500 mg total) by mouth 2 (two) times daily.   metoprolol succinate (TOPROL-XL) 50 MG 24 hr tablet Take 1 tablet (50 mg total) by mouth daily. Take with or immediately following a meal.   Multiple Vitamins-Minerals (PRESERVISION AREDS) CAPS Take 1 capsule by mouth in the morning and at bedtime.   ramipril (ALTACE) 10 MG capsule Take 1 capsule (10  mg total) by mouth daily.   ramipril (ALTACE) 2.5 MG capsule Take 1 capsule (2.5 mg total) by mouth daily. Take with ramipril 10 mg daily   sennosides-docusate sodium (SENOKOT-S) 8.6-50 MG tablet Take 1 tablet by mouth as needed for constipation.   No facility-administered encounter medications on file as of 02/19/2022.    Allergies (verified) Scallops [shellfish allergy] and Sulfa antibiotics   History: Past Medical History:  Diagnosis Date   Focal seizures (HCC)    Per [facility] new patient packet/ Right hand    Hearing loss    Hyperlipidemia    Hypertension    Kidney stone    Macular degeneration    Per [facility] new patient packet   Nephrolithiasis    SDH (subdural hematoma) (HCC)    Past Surgical History:  Procedure Laterality Date   CATARACT EXTRACTION  1997   Per [facility] new patient packet   KIDNEY STONE SURGERY     Per [facility] new patient packet   KIDNEY STONE SURGERY  1959, 46, 11   9 CUFF REPAIR  2010   SHOULDER SURGERY  2008   [employer]global amnesia  2020   Family History  Problem Relation Age of Onset   Heart failure Mother        age 9   Lung cancer Father        age 31   Social History   Socioeconomic History   Marital status: Married    Spouse name:  cecil   Number of children: 2   Years of education: college   Highest education level: Bachelor's degree (e.g., BA, AB, BS)  Occupational History   Occupation: Runner, 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 1955      Do you live in a house, apartment, assisted living, condo, trailer, etc.? Townhome      Is it one or more stories? 2 story      How many persons live in your home? 3         Do you have any pets in your home?(please list) Dog      Highest level of education completed: College degree      Current or past profession: Teacher      Do you exercise?:Yes    Type and how often: Walk, daily      Do you have a Living Will? (Form that indicates scenarios where you would not want your life prolonged) Yes      Do you have a DNR form?         If not, would you like to discuss one? No      Do you have signed POA/HPOA forms? Yes      Do you have difficulty bathing or dressing yourself? No      Do you have difficulty preparing food or eating? No      Do you have difficulty managing medications? No      Do you have difficulty managing your finances? No      Do you have difficulty affording your medications? No      Lives at home with her husband (retired MD - Sports administrator) and daughter (retired Charity fundraiser).   Right-handed.   1.5 cups per day.                  Social Determinants of Health   Financial Resource Strain: Not on file  Food Insecurity: Not on file  Transportation Needs: Not on file  Physical Activity: Not on file  Stress: Not on file  Social Connections: Not on file    Tobacco Counseling Counseling given: Not Answered   Clinical Intake:  Pre-visit preparation completed: No  Pain : No/denies pain     BMI - recorded: 25  How often do you need to have someone help you when you read instructions, pamphlets, or other written materials from your doctor or pharmacy?: 1 - Never  Diabetic?no         Activities of Daily Living    02/19/2022   11:23 AM  In your present state of health, do you have any difficulty performing the following activities:  Hearing? 0  Vision? 1  Difficulty concentrating or making decisions? 0  Walking or climbing stairs? 0  Dressing or bathing? 0  Doing errands, shopping? 0  Preparing Food and eating ? N  Using the Toilet? N  In the past six months, have you accidently leaked urine? N  Do you have problems with loss of bowel control? N  Managing your Medications? N  Managing your Finances? N  Housekeeping or managing your Housekeeping? N    Patient Care Team: Mahlon Gammon, MD as PCP - General (Internal Medicine) [place], [facility] as Consulting Physician (Neurosurgery) Levert Feinstein, MD as Consulting Physician (Neurology)  Indicate any recent Medical Services you may have received from other than [facility] providers in the past year (date may be approximate).     Assessment:   This is a routine wellness examination for [place].  Hearing/Vision screen Hearing Screening - Comments:: Patient has some hearing problems, she does not wear hearing aids. She refuses. Vision Screening - Comments:: Patient has a lot of issues with vision. Right eye almost gone due to macular degeneration. Left ey pretty good. Patient saw eye doctor last week. Patient had reaction to COVID booster. Patient sees [facility] doctor at [facility].( Dr. Charlotte Sanes) She saw Dr. Emily Filbert 2 weeks ago.  Dietary issues and exercise activities discussed: Current Exercise Habits: Structured exercise class;Home exercise routine, Type of exercise: walking;calisthenics;stretching, Time (Minutes): 30, Frequency (Times/Week): 4, Weekly Exercise (Minutes/Week): 120   Goals Addressed   None    Depression Screen    02/19/2022   11:07 AM 07/26/2020    2:33 PM 07/05/2020    1:45 PM  PHQ 2/9 Scores  PHQ - 2 Score 0 2 0  PHQ- 9 Score   1    Fall Risk    02/19/2022   11:12 AM 01/28/2022    2:02 PM 10/01/2021   11:59 AM 05/09/2021    9:50 AM 01/08/2021   12:57 PM  Fall Risk   Falls in the past year? 0 0 0 0 0  Number falls in past yr: 0 0 0 0 0  Injury with Fall? 0 0 0    Risk for fall due to : No Fall  Risks Impaired balance/gait No Fall Risks    Follow up Falls evaluation completed Falls evaluation completed Falls evaluation completed Falls evaluation completed     FALL RISK PREVENTION PERTAINING TO THE HOME:  Any stairs in or around the home? No  If so, are there any without handrails?  na Home free of loose throw rugs in walkways, pet beds, electrical cords, etc? Yes  Adequate lighting in your home to reduce risk of falls? Yes   ASSISTIVE DEVICES UTILIZED TO PREVENT FALLS:  Life alert? Yes  Use of a cane, walker or w/c? No  Grab bars in the bathroom? Yes  Shower chair or bench in shower? No  Elevated toilet seat or a handicapped toilet? Yes   TIMED UP AND GO:  Was the test performed? No .    Cognitive Function:        02/19/2022   11:15 AM  6CIT Screen  What Year? 0 points  What month? 0 points  What time? 0 points  Count back from 20 0 points  Months in reverse 0 points  Repeat phrase 0 points  Total Score 0 points    Immunizations Immunization History  Administered Date(s) Administered   Fluad Quad(high Dose 65+) 06/29/2020   Influenza-Unspecified 07/02/2021   Moderna Sars-Covid-2 Vaccination 10/01/2019, 10/29/2019, 08/08/2020, 07/24/2021   Pneumococcal Conjugate-13 09/24/2015  Pneumococcal Polysaccharide-23 09/23/2017   Tetanus 01/10/2021   Unspecified SARS-COV-2 Vaccination 02/07/2022    TDAP status: Up to date  Flu Vaccine status: Up to date  Pneumococcal vaccine status: Up to date  Covid-19 vaccine status: Completed vaccines  Qualifies for Shingles Vaccine? Yes   Zostavax completed No   Shingrix Completed?: No.    Education has been provided regarding the importance of this vaccine. Patient has been advised to call insurance company to determine out of pocket expense if they have not yet received this vaccine. Advised may also receive vaccine at local pharmacy or Health Dept. Verbalized acceptance and understanding.  Screening Tests Health  Maintenance  Topic Date Due   Zoster Vaccines- Shingrix (1 of 2) Never done   DEXA SCAN  Never done   INFLUENZA VACCINE  04/23/2022   TETANUS/TDAP  01/11/2031   Pneumonia Vaccine 13+ Years old  Completed   COVID-19 Vaccine  Completed   HPV VACCINES  Aged Out    Health Maintenance  Health Maintenance Due  Topic Date Due   Zoster Vaccines- Shingrix (1 of 2) Never done   DEXA SCAN  Never done    Colorectal cancer screening: No longer required.   Mammogram status: No longer required due to age out.  Declines bone density Lung Cancer Screening: (Low Dose CT Chest recommended if Age 44-80 years, 30 pack-year currently smoking OR have quit w/in 15years.) does not qualify.   Lung Cancer Screening Referral: na  Additional Screening:  Hepatitis C Screening: does not qualify; Completed na  Vision Screening: Recommended annual ophthalmology exams for early detection of glaucoma and other disorders of the eye. Is the patient up to date with their annual eye exam?  Yes  Who is the provider or what is the name of the office in which the patient attends annual eye exams? [facility] If pt is not established with a provider, would they like to be referred to a provider to establish care? No .   Dental Screening: Recommended annual dental exams for proper oral hygiene  Community Resource Referral / Chronic Care Management: CRR required this visit?  No   CCM required this visit?  No      Plan:     I have personally reviewed and noted the following in the patient's chart:   Medical and social history Use of alcohol, tobacco or illicit drugs  Current medications and supplements including opioid prescriptions.  Functional ability and status Nutritional status Physical activity Advanced directives List of other physicians Hospitalizations, surgeries, and ER visits in previous 12 months Vitals Screenings to include cognitive, depression, and falls Referrals and appointments  In  addition, I have reviewed and discussed with patient certain preventive protocols, quality metrics, and best practice recommendations. A written personalized care plan for preventive services as well as general preventive health recommendations were provided to patient.     Sharon Seller, NP   02/19/2022    Virtual Visit via Telephone Note  I connected with patient 02/19/22 at 11:00 AM EDT by telephone and verified that I am speaking with the correct person using two identifiers.  Location: Patient: home Provider: [facility]   I discussed the limitations, risks, security and privacy concerns of performing an evaluation and management service by telephone and the availability of in person appointments. I also discussed with the patient that there may be a patient responsible charge related to this service. The patient expressed understanding and agreed to proceed.   I discussed the assessment  and treatment plan with the patient. The patient was provided an opportunity to ask questions and all were answered. The patient agreed with the plan and demonstrated an understanding of the instructions.   The patient was advised to call back or seek an in-person evaluation if the symptoms worsen or if the condition fails to improve as anticipated.  I provided 16 minutes of non-face-to-face time during this encounter.  Janene HarveyJessica K. Biagio BorgEubanks, AGNP Avs printed and mailed

## 2022-03-12 NOTE — Progress Notes (Unsigned)
No chief complaint on file.    HISTORICAL  Nichole Cox is a 88 year old female, seen in request by her primary care physician Dr. Mariea Clonts, Jonelle Sidle for evaluation of subdural hematoma, focal seizure, initial evaluation was on September 11, 2020.  I reviewed and summarized the referring note. PMHx HTN  She moved from [place] to [place] at the end of September 2021, she denies a history of head injury, on 06/20/2020, she noticed recurrent episode of shooting sensation rising from her right hand to right elbow, mild slurred speech, droopy of right face, there was no loss of consciousness, last 2 to 3 minutes  She had few similar recurrent episodes on 06/25/2020, was brought to the emergency room, was diagnosed with partial seizure, imaging study reviewed left subdural hematoma  I personally reviewed multiple CTs, MRI of the brain, MRA of the brain and neck October 2021: Complex left cerebral convexity extra-axial hemorrhage, measuring up to 13 millimeters posteriorly, compatible with recent subdural hemorrhage, local mass-effect without midline shift,  No evidence of significant neck and intracranial large vessel disease, mild irregularity of bilateral V3 vertebral artery, bilateral cervical internal carotid artery, likely related to atherosclerosis, less than 50%  Stenosis  She was treated with Keppra 500 mg twice a day, tolerating the medication well, there was no recurrent seizure activity  She now moved to assisted living,  Update March 13, 2021 SS: Here today with her daughter. Resides at [facility]. Remains on Keppra 500 mg twice daily since SDH, no seizure since then, no falls. Doing well, her main issue is macular degeneration in the right eye.  Her husband has dementia. Echo ordered at last visit, not yet completed.  Has no complaints today.  She would like to add this history to her chart: In 2014, had been working in her garden, leaning over, working most  of the afternoon, came inside to watch TV, all of a sudden felt things weren't real, went to the hospital, MRI negative for stroke, diagnosed with transient global amnesia lasted 4:00 pm to midnight, then back to normal.   Update March 13, 2022 SS: Echocardiogram July 2022 showed EF 60 to 65%, no significant abnormalities  REVIEW OF SYSTEMS: Full 14 system review of systems performed and notable only for as above  See HPI  ALLERGIES: Allergies  Allergen Reactions   Scallops [Shellfish Allergy] Hives and Shortness Of Breath   Sulfa Antibiotics Rash    HOME MEDICATIONS: Current Outpatient Medications  Medication Sig Dispense Refill   Cyanocobalamin (VITAMIN B 12 PO) Take by mouth.     latanoprost (XALATAN) 0.005 % ophthalmic solution Place 1 drop into both eyes at bedtime.     levETIRAcetam (KEPPRA) 500 MG tablet Take 1 tablet (500 mg total) by mouth 2 (two) times daily. 180 tablet 4   metoprolol succinate (TOPROL-XL) 50 MG 24 hr tablet Take 1 tablet (50 mg total) by mouth daily. Take with or immediately following a meal. 90 tablet 1   Multiple Vitamins-Minerals (PRESERVISION AREDS) CAPS Take 1 capsule by mouth in the morning and at bedtime.     ramipril (ALTACE) 10 MG capsule Take 1 capsule (10 mg total) by mouth daily. 90 capsule 1   ramipril (ALTACE) 2.5 MG capsule Take 1 capsule (2.5 mg total) by mouth daily. Take with ramipril 10 mg daily 90 capsule 3   sennosides-docusate sodium (SENOKOT-S) 8.6-50 MG tablet Take 1 tablet by mouth as needed for constipation.     No current facility-administered medications  for this visit.    PAST MEDICAL HISTORY: Past Medical History:  Diagnosis Date   Focal seizures (HCC)    Per [facility] new patient packet/ Right hand    Hearing loss    Hyperlipidemia    Hypertension    Kidney stone    Macular degeneration    Per [facility] new patient packet   Nephrolithiasis    SDH (subdural hematoma) (HCC)     PAST SURGICAL HISTORY: Past Surgical History:   Procedure Laterality Date   CATARACT EXTRACTION  1997   Per [facility] new patient packet   KIDNEY STONE SURGERY     Per [facility] new patient packet   KIDNEY STONE SURGERY  1959, 35, 26   40 CUFF REPAIR  2010   SHOULDER SURGERY  2008   transglobal amnesia  2020    FAMILY HISTORY: Family History  Problem Relation Age of Onset   Heart failure Mother        age 29   Lung cancer Father        age 96    SOCIAL HISTORY: Social History   Socioeconomic History   Marital status: Married    Spouse name: Nichole Cox   Number of children: 2   Years of education: college   Highest education level: Bachelor's degree (e.g., BA, AB, BS)  Occupational History   Occupation: Runner, 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 1955      Do you live in a house, apartment, assisted living, condo, trailer, etc.? Townhome      Is it one or more stories? 2 story      How many persons live in your home? 3         Do you have any pets in your home?(please list) Dog      Highest level of education completed: College degree      Current or past profession: Teacher      Do you exercise?:Yes    Type and how often: Walk, daily      Do you have a Living Will? (Form that indicates scenarios where you would not want your life prolonged) Yes      Do you have a DNR form?         If not, would you like to discuss one? No      Do you have signed POA/HPOA forms? Yes       Do you have difficulty bathing or dressing yourself? No      Do you have difficulty preparing food or eating? No      Do you have difficulty managing medications? No      Do you have difficulty managing your finances? No  Do you have difficulty affording your medications? No      Lives at home with her husband (retired MD - Sports administrator) and daughter (retired Charity fundraiser).   Right-handed.   1.5 cups per day.                  Social Determinants of Health   Financial Resource Strain: Not on file  Food Insecurity: Not on file  Transportation Needs: Not on file  Physical Activity: Not on file  Stress: Not on file  Social Connections: Not on file  Intimate Partner Violence: Not on file   PHYSICAL EXAM   There were no vitals filed for this visit.   Not recorded     There is no height or weight on file to calculate BMI.  PHYSICAL EXAMNIATION:  Physical Exam  General: The patient is alert and cooperative at the time of the examination.  Very pleasant, well-appearing.  Skin: No significant peripheral edema is noted.  Neurologic Exam  Mental status: The patient is alert and oriented x 3 at the time of the examination. The patient has apparent normal recent and remote memory, with an apparently normal attention span and concentration ability.  Cranial nerves: Facial symmetry is present. Speech is normal, no aphasia or dysarthria is noted. Extraocular movements are full. Visual fields are full.  Motor: The patient has good strength in all 4 extremities.  Sensory examination: Soft touch sensation is symmetric on the face, arms, and legs.  Coordination: The patient has good finger-nose-finger and heel-to-shin bilaterally.  Gait and station: Gait is overall normal, steady, independent.  Reflexes: Deep tendon reflexes are symmetric.  DIAGNOSTIC DATA (LABS, IMAGING, TESTING) - I reviewed patient records, labs, notes, testing and imaging myself where  available.  ASSESSMENT AND PLAN  DAJIA GUNNELS is a 88 y.o. female   Left subdural hematoma in October 2021 2.   Partial seizure, 3.   Cerebrovascular disease  -Scheduled for echocardiogram 04/03/21 to complete cerebrovascular stratification -Has HTN, holding antiplatelet agent for now, indicates her cardiologist discontinued aspirin about 2 years ago felt no longer needed -No recurrent seizures, will continue Keppra 500 mg twice daily, no adverse effect -Encouraged to continue activity, seems to be doing great at this point -Follow-up in 1 year or sooner if needed  Margie Ege, Edrick Oh, DNP  [facility] [address] [place], [place] [address] [phone]

## 2022-03-13 ENCOUNTER — Encounter: Payer: Self-pay | Admitting: Neurology

## 2022-03-13 ENCOUNTER — Telehealth: Payer: Self-pay

## 2022-03-13 ENCOUNTER — Ambulatory Visit (INDEPENDENT_AMBULATORY_CARE_PROVIDER_SITE_OTHER): Payer: Medicare Other | Admitting: Neurology

## 2022-03-13 VITALS — BP 162/86 | HR 106 | Ht 64.0 in | Wt 149.0 lb

## 2022-03-13 DIAGNOSIS — S065XAA Traumatic subdural hemorrhage with loss of consciousness status unknown, initial encounter: Secondary | ICD-10-CM

## 2022-03-13 DIAGNOSIS — G40209 Localization-related (focal) (partial) symptomatic epilepsy and epileptic syndromes with complex partial seizures, not intractable, without status epilepticus: Secondary | ICD-10-CM

## 2022-03-13 MED ORDER — LEVETIRACETAM 500 MG PO TABS: 500.0000 mg | ORAL_TABLET | Freq: Two times a day (BID) | ORAL | 4 refills | Status: DC

## 2022-03-13 NOTE — Telephone Encounter (Signed)
Laura the patient's daughter is requesting a letter for a social security apnt on Friday that must be on a letterhead so that she can be transferred to be their payee since they are not physically able to come into the office. She was hoping to get a letter by the end of today or tomorrow. She can be reached at [phone]. 

## 2022-03-13 NOTE — Patient Instructions (Signed)
Continue Keppra at current dosing  Call for any concerns

## 2022-04-29 LAB — BASIC METABOLIC PANEL

## 2022-04-29 LAB — COMPREHENSIVE METABOLIC PANEL

## 2022-04-29 LAB — TSH
TSH: 6.07 — AB (ref 0.41–5.90)
TSH: 6.07 — AB (ref 0.41–5.90)

## 2022-04-30 ENCOUNTER — Encounter: Payer: Self-pay | Admitting: *Deleted

## 2022-05-01 ENCOUNTER — Encounter: Payer: Self-pay | Admitting: Internal Medicine

## 2022-05-01 ENCOUNTER — Non-Acute Institutional Stay: Payer: Medicare Other | Admitting: Internal Medicine

## 2022-05-01 VITALS — BP 128/86 | HR 78 | Temp 97.5°F | Ht 64.0 in | Wt 151.6 lb

## 2022-05-01 DIAGNOSIS — E538 Deficiency of other specified B group vitamins: Secondary | ICD-10-CM

## 2022-05-01 DIAGNOSIS — H3530 Unspecified macular degeneration: Secondary | ICD-10-CM

## 2022-05-01 DIAGNOSIS — E038 Other specified hypothyroidism: Secondary | ICD-10-CM | POA: Diagnosis not present

## 2022-05-01 DIAGNOSIS — E782 Mixed hyperlipidemia: Secondary | ICD-10-CM

## 2022-05-01 DIAGNOSIS — G43109 Migraine with aura, not intractable, without status migrainosus: Secondary | ICD-10-CM

## 2022-05-01 DIAGNOSIS — I10 Essential (primary) hypertension: Secondary | ICD-10-CM

## 2022-05-01 DIAGNOSIS — S065XAA Traumatic subdural hemorrhage with loss of consciousness status unknown, initial encounter: Secondary | ICD-10-CM

## 2022-05-01 DIAGNOSIS — R7989 Other specified abnormal findings of blood chemistry: Secondary | ICD-10-CM

## 2022-05-01 DIAGNOSIS — G40209 Localization-related (focal) (partial) symptomatic epilepsy and epileptic syndromes with complex partial seizures, not intractable, without status epilepticus: Secondary | ICD-10-CM

## 2022-05-03 NOTE — Progress Notes (Signed)
Location:  [facility] features editor of Service:  Clinic (12)  Provider:   Code Status: DNR Goals of Care:     02/19/2022   11:14 AM  Advanced Directives  Does Patient Have a Medical Advance Directive? Yes  Type of Estate agent of [place];Living will  Does patient want to make changes to medical advance directive? No - Patient declined  Copy of Healthcare Power of Attorney in Chart? Yes - validated most recent copy scanned in chart (See row information)     Chief Complaint  Patient presents with   Medical Management of Chronic Issues    Medical Management of Chronic Issues. 3 Month follow up    HPI: Patient is a 88 y.o. female seen today for medical management of chronic diseases.    Patient has a history of Left subdural hematoma in 10/21 when she was admitted in the hospital with right arm weakness and slurred speech.  Etiology was unclear as she did not have any falls anticoagulation. 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 Has been on Keppra and has not had any issues Caregiver stress with her husband who is in memory care for worsening neurodegenerative disorder. 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  It has been told not to take any more boosters. Does not want to take shingles or do DEXA right now  Daughter lives in [place] and one in [place] Past Medical History:  Diagnosis Date   Focal seizures (HCC)    Per [facility] new patient packet/ Right hand    Hearing loss    Hyperlipidemia    Hypertension    Kidney stone    Macular degeneration    Per [facility] new  patient packet   Nephrolithiasis    SDH (subdural hematoma) (HCC)     Past Surgical History:  Procedure Laterality Date   CATARACT EXTRACTION  1997   Per [facility] new patient packet   KIDNEY STONE SURGERY     Per [facility] new patient packet   KIDNEY STONE SURGERY  1959, 70, 76   1 CUFF REPAIR  2010   SHOULDER SURGERY  2008   [employer] amnesia  2020    Allergies  Allergen Reactions   Scallops [Shellfish Allergy] Hives and Shortness Of Breath   Sulfa Antibiotics Rash    Outpatient Encounter Medications as of 05/01/2022  Medication Sig   Cyanocobalamin (VITAMIN B 12 PO) Take 1,000 mcg/day by mouth daily.   latanoprost (XALATAN) 0.005 % ophthalmic solution Place 1 drop into both eyes at bedtime.   levETIRAcetam (KEPPRA) 500 MG tablet Take 1 tablet (500 mg total) by mouth 2 (two) times daily.   metoprolol succinate (TOPROL-XL) 50 MG 24 hr tablet Take 1 tablet (50 mg total) by mouth daily. Take with or immediately following a meal.   Multiple Vitamins-Minerals (PRESERVISION AREDS) CAPS Take 1 capsule by mouth in the morning and at bedtime.   ramipril (ALTACE) 10 MG capsule Take 1 capsule (10 mg total) by mouth daily.   ramipril (ALTACE) 2.5 MG capsule Take 1 capsule (2.5 mg total) by mouth daily. Take with ramipril 10 mg daily  sennosides-docusate sodium (SENOKOT-S) 8.6-50 MG tablet Take 1 tablet by mouth as needed for constipation.   No facility-administered encounter medications on file as of 05/01/2022.    Review of Systems:  Review of Systems  Constitutional:  Negative for activity change and appetite change.  HENT: Negative.    Respiratory:  Negative for cough and shortness of breath.   Cardiovascular:  Negative for leg swelling.  Gastrointestinal:  Negative for constipation.  Genitourinary: Negative.   Musculoskeletal:  Negative for arthralgias, gait problem and myalgias.  Skin: Negative.   Neurological:  Negative for dizziness and weakness.  Psychiatric/Behavioral:   Negative for confusion, dysphoric mood and sleep disturbance.     Health Maintenance  Topic Date Due   Zoster Vaccines- Shingrix (1 of 2) Never done   DEXA SCAN  Never done   COVID-19 Vaccine (6 - Moderna series) 04/04/2022   INFLUENZA VACCINE  04/23/2022   TETANUS/TDAP  01/11/2031   Pneumonia Vaccine 73+ Years old  Completed   HPV VACCINES  Aged Out    Physical Exam: Vitals:   05/01/22 1105  BP: 128/86  Pulse: 78  Temp: (!) 97.5 F (36.4 C)  TempSrc: Skin  SpO2: 98%  Weight: 151 lb 9.6 oz (68.8 kg)  Height: 5\' 4"  (1.626 m)   Body mass index is 26.02 kg/m. Physical Exam Vitals reviewed.  Constitutional:      Appearance: Normal appearance.  HENT:     Head: Normocephalic.     Nose: Nose normal.     Mouth/Throat:     Mouth: Mucous membranes are moist.     Pharynx: Oropharynx is clear.  Eyes:     Pupils: Pupils are equal, round, and reactive to light.  Cardiovascular:     Rate and Rhythm: Normal rate and regular rhythm.     Pulses: Normal pulses.     Heart sounds: Normal heart sounds. No murmur heard. Pulmonary:     Effort: Pulmonary effort is normal.     Breath sounds: Normal breath sounds.  Abdominal:     General: Abdomen is flat. Bowel sounds are normal.     Palpations: Abdomen is soft.  Musculoskeletal:        General: No swelling.     Cervical back: Neck supple.  Skin:    General: Skin is warm.  Neurological:     General: No focal deficit present.     Mental Status: She is alert and oriented to person, place, and time.  Psychiatric:        Mood and Affect: Mood normal.        Thought Content: Thought content normal.     Labs reviewed: Basic Metabolic Panel: Recent Labs    01/25/22 0000 04/29/22 0000  NA 131*  --   K 4.6  --   CL 95*  --   CO2 25*  --   BUN 13  --   CREATININE 0.9  --   CALCIUM 9.9  --   TSH 8.17* 6.07*   Liver Function Tests: Recent Labs    01/25/22 0000  AST 17  ALT 13  ALKPHOS 79  ALBUMIN 4.3   No results for  input(s): "LIPASE", "AMYLASE" in the last 8760 hours. No results for input(s): "AMMONIA" in the last 8760 hours. CBC: Recent Labs    01/25/22 0000  WBC 3.9  HGB 13.1  HCT 39  PLT 247   Lipid Panel: Recent Labs    01/25/22 0000  CHOL 217*  HDL 61  LDLCALC  141  TRIG 80   Lab Results  Component Value Date   HGBA1C 5.6 06/26/2020    Procedures since last visit: No results found.  Assessment/Plan 1. Primary hypertension Doing well on Ramipril and Metoprolol  2. B12 deficiency Now on Supplement  3. H/o SDH (subdural hematoma) (HCC)   4. Subclinical hypothyroidism TSH slightly elevated  5. Partial symptomatic epilepsy with complex partial seizures, not intractable, without status epilepticus (HCC) Continue Naida Sleight with Neurology  6. Macular degeneration of both eyes, unspecified type Follows with Ophthalmology Does not drive anymore  7. Mixed hyperlipidemia Continue to monitor for now No h/o CVA or CAD  8. Ocular migraine Possible no Covid Booster in Fall    Labs/tests ordered:  * No order type specified * Next appt:  09/02/2022

## 2022-08-21 IMAGING — MR MR HEAD W/O CM
12 of 13 series · 44 of 48 positions shown · IV contrast (agent unspecified)
Comparison: None.

CLINICAL DATA: Four episodes of right upper arm weakness



[Series 5: DWI · axial · 3.0mm · 0.88mm/px · z∈[-91,+52]mm · 8 of 100 slices shown (1 of 4)]
[im 1/100]
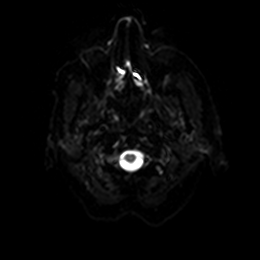
[im 15/100]
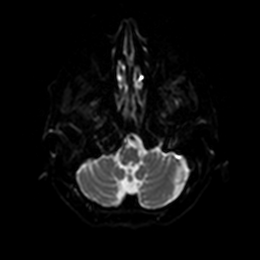
[im 29/100]
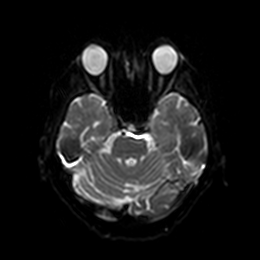
[im 43/100]
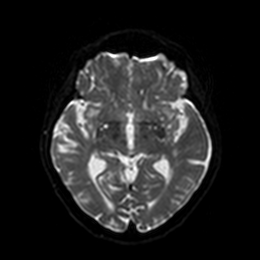
[im 57/100]
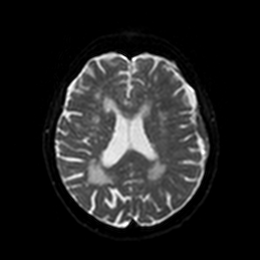
[im 71/100]
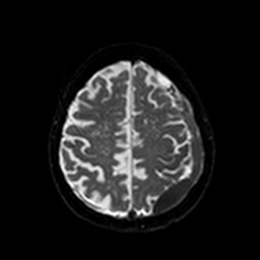
[im 85/100]
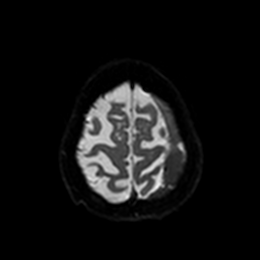
[im 100/100]
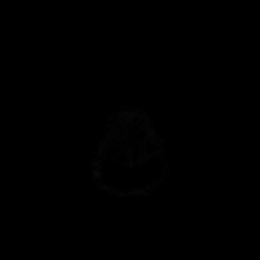

[Series 6: DWI · axial · 3.0mm · 0.88mm/px · z∈[-91,+52]mm · 5 of 50 slices shown (2 of 4)]
[im 1/50]
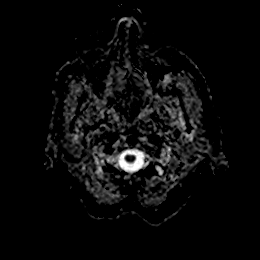
[im 13/50]
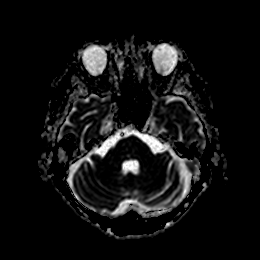
[im 25/50]
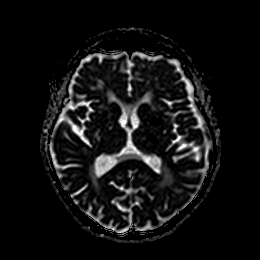
[im 37/50]
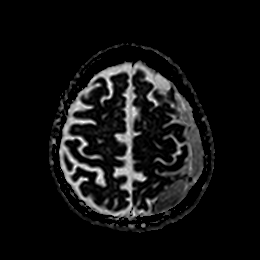
[im 50/50]
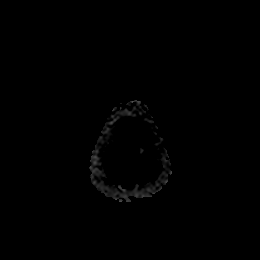

[Series 7: DWI · coronal · 4.0mm · 0.88mm/px · 5 of 66 slices shown (3 of 4)]
[im 1/66]
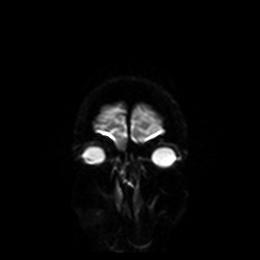
[im 17/66]
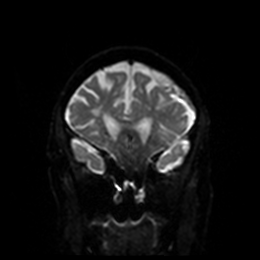
[im 33/66]
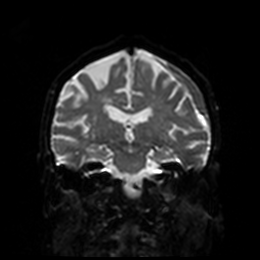
[im 49/66]
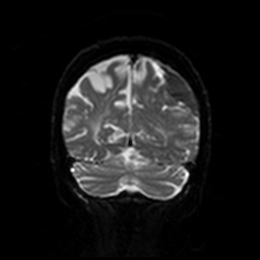
[im 66/66]
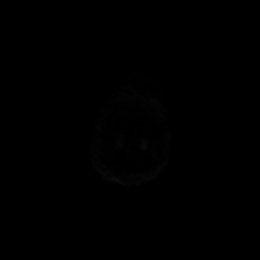

[Series 8: DWI · coronal · 4.0mm · 0.88mm/px · 2 of 33 slices shown (4 of 4)]
[im 1/33]
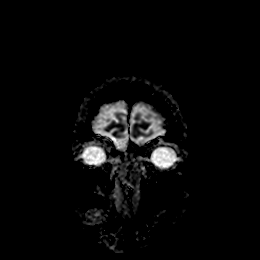
[im 33/33]
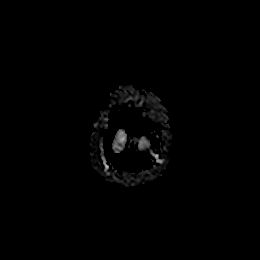

[Series 9: T1 · sagittal · 5.0mm · 0.75mm/px · 2 of 23 slices shown]
[im 1/23]
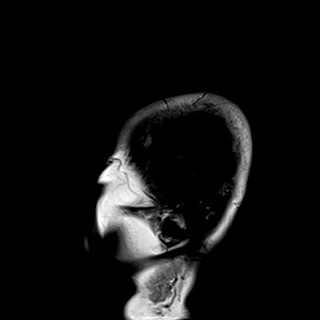
[im 23/23]
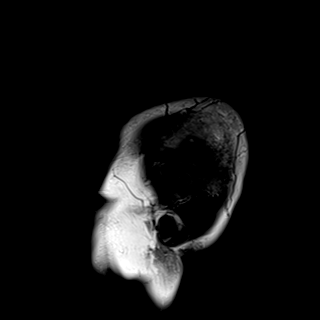

[Series 10: T2 · axial · 5.0mm · 0.72mm/px · z∈[-90,+51]mm · 2 of 25 slices shown (1 of 2)]
[im 1/25]
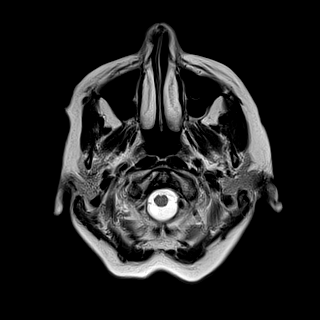
[im 25/25]
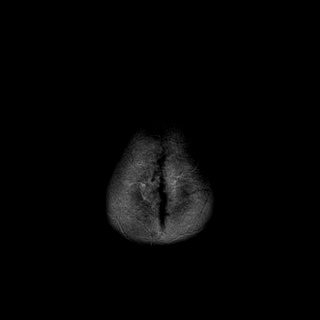

[Series 11: FLAIR · axial · 5.0mm · 0.45mm/px · z∈[-87,+53]mm · 2 of 25 slices shown]
[im 1/25]
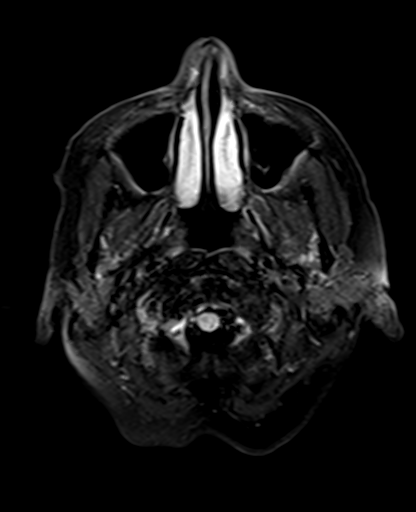
[im 25/25]
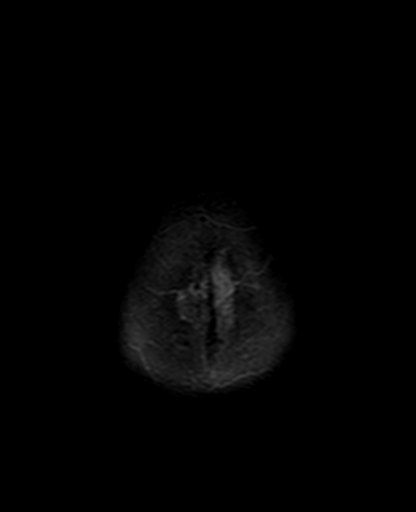

[Series 12: mag_images · axial · 3.0mm · 0.90mm/px · z∈[-103,+69]mm · 4 of 60 slices shown]
[im 1/60]
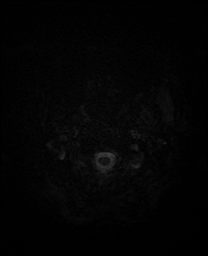
[im 20/60]
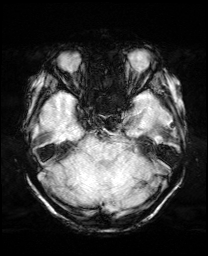
[im 40/60]
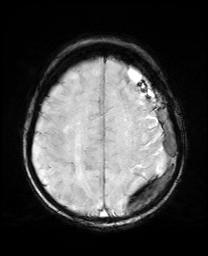
[im 60/60]
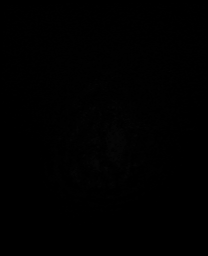

[Series 13: pha_images · axial · 3.0mm · 0.90mm/px · z∈[-103,+69]mm · 4 of 60 slices shown]
[im 1/60]
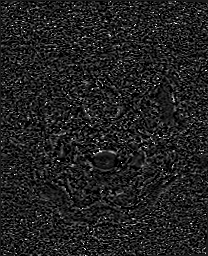
[im 20/60]
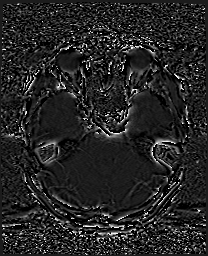
[im 40/60]
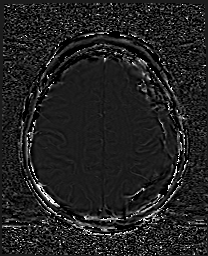
[im 60/60]
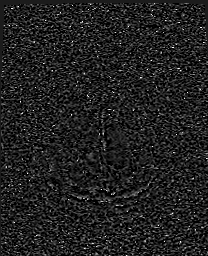

[Series 14: swi_images · axial · 3.0mm · 0.90mm/px · z∈[-103,+69]mm · 4 of 60 slices shown]
[im 1/60]
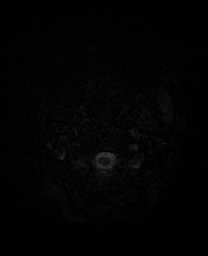
[im 20/60]
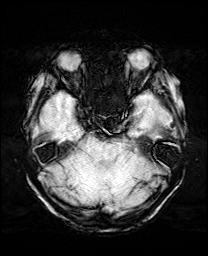
[im 40/60]
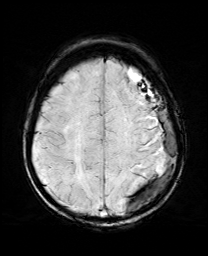
[im 60/60]
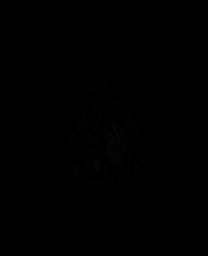

[Series 15: mip_images(sw) · axial · 24.0mm · 0.90mm/px · z∈[-93,+59]mm · 4 of 53 slices shown]
[im 1/53]
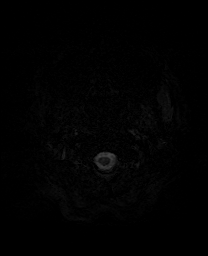
[im 18/53]
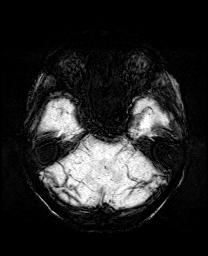
[im 35/53]
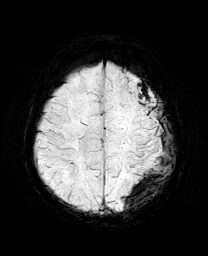
[im 53/53]
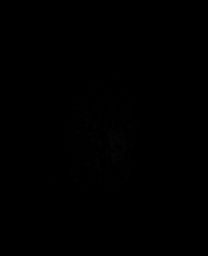

[Series 17: T2 · coronal · 5.0mm · 0.34mm/px · 2 of 29 slices shown (2 of 2)]
[im 1/29]
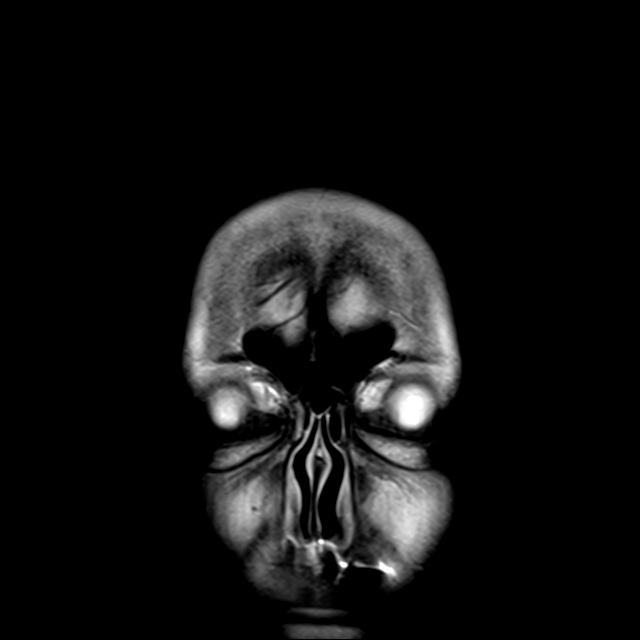
[im 29/29]
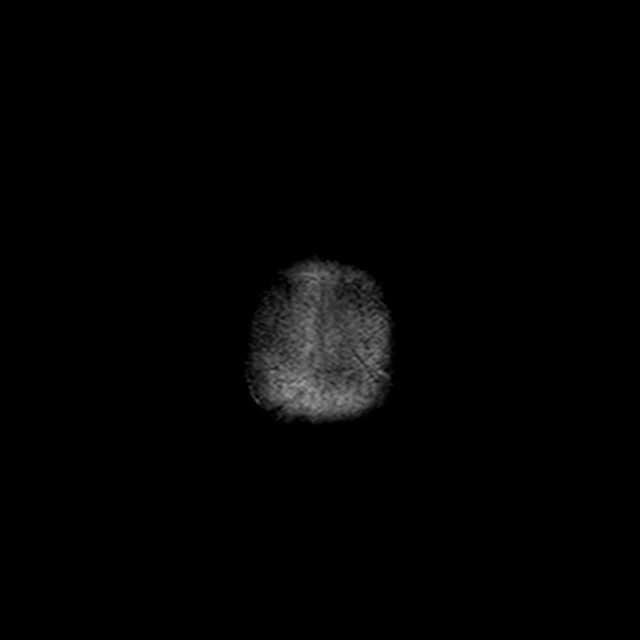

[44 of 48 positions shown; findings below may reference images not displayed]

FINDINGS: MRI HEAD FINDINGS

Brain: No acute infarct. Redemonstrated complex left cerebral
convexity extra-axial hemorrhage, measuring up to approximately 13
mm posteriorly (series 17, image 6). Areas of T1 hyperintensity
within the collection correlate with areas of hyperdensity seen on
recent CT head and are compatible with recent hemorrhage. There is
associated local mass effect without midline shift. Basal cisterns
are patent. No hydrocephalus. There is scattered T2/FLAIR
hyperintensity within the white matter, compatible with chronic
microvascular ischemic disease. T2/FLAIR hyperintensities within
bilateral basal ganglia, favored to reflect dilated perivascular
spaces

Vascular: Flow voids are maintained at the skull base.

Skull and upper cervical spine: Normal marrow signal.

Sinuses/Orbits: Mild ethmoid air cell mucosal thickening. No
air-fluid levels. No acute orbital abnormality.

Other: Moderate right and small left mastoid effusions.

MRA HEAD FINDINGS

Anterior circulation: Petrous and cavernous carotids are patent.
There is mild left greater than right paraclinoid ICA narrowing.
Bilateral M1 and proximal M2 MCAs are patent. Bilateral A1 and A2
ACA is are patent. No evidence of aneurysm. No specific evidence of
vascular malformation or aneurysm.

Posterior circulation. There is mild narrowing of the right V4
vertebral artery. The basilar artery is patent without evidence of
significant stenosis. Bilateral posterior cerebral arteries are
patent without significant stenosis or occlusion. No specific
evidence of vascular malformation or aneurysm.

MRA NECK FINDINGS

Great vessel origins are patent. No evidence of significant
(greater than 50%) arterial stenosis or occlusion within the neck.

Mild narrowing at bilateral carotid bifurcations, likely related to
atherosclerosis without evidence of greater than 50% narrowing of
the common carotid arteries or internal carotid arteries.

Mildly left dominant vertebral artery. There is mild irregularity of
bilateral V3 vertebral arteries as well as bilateral cervical ICAs.
IMPRESSION: 1. No acute infarct.
2. Redemonstrated complex left cerebral convexity extra-axial
hemorrhage, measuring up to approximately 13 mm posteriorly. Areas
of T1 hyperintensity within the collection correlate with areas of
hyperdensity seen on recent CT head and are compatible with recent
hemorrhage. There is associated local mass effect without midline
shift.
3. No evidence of significant (greater than 50%) arterial stenosis
or occlusion within the head or neck.
4. Mild irregularity of bilateral V3 vertebral arteries as well as
bilateral cervical ICAs, which may relate to atherosclerosis or
fibromuscular dysplasia.

## 2022-08-21 IMAGING — MR MR MRA HEAD W/O CM
1 series · 22 of 48 positions shown · IV contrast (agent unspecified)
Comparison: None.

CLINICAL DATA: Four episodes of right upper arm weakness



[Series 5: 3d cow · axial · 0.5mm · 0.41mm/px · z∈[-90,-11]mm · 22 of 172 slices shown]
[im 1/172]
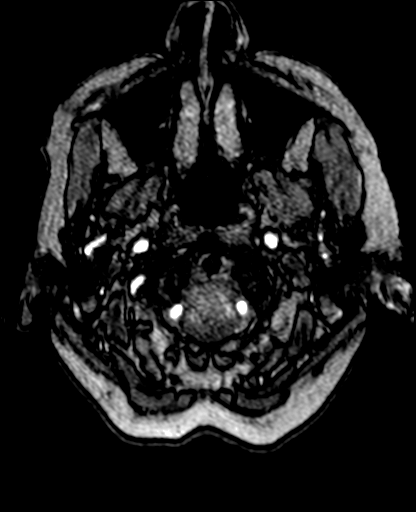
[im 4/172]
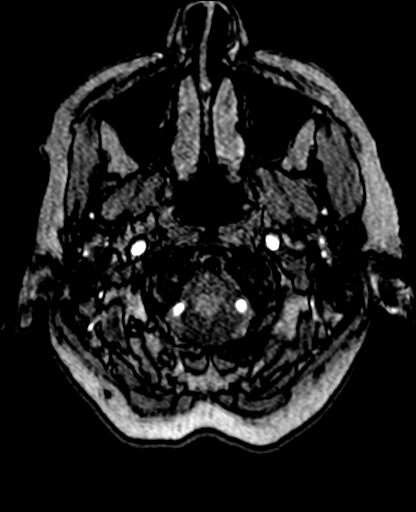
[im 8/172]
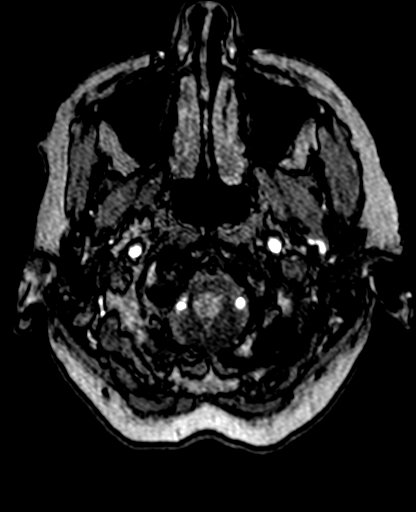
[im 11/172]
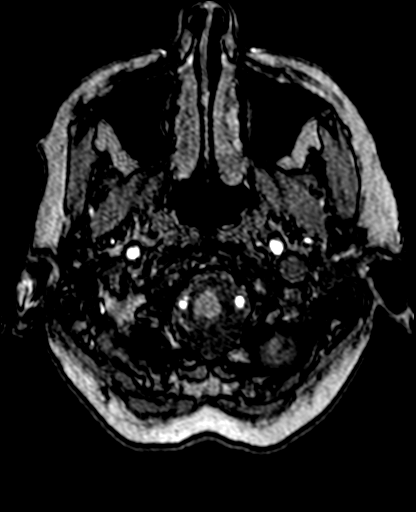
[im 15/172]
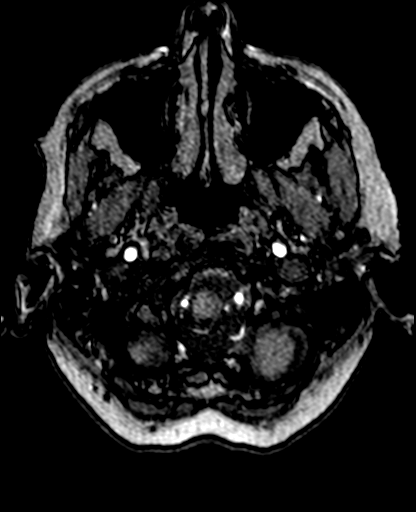
[im 19/172]
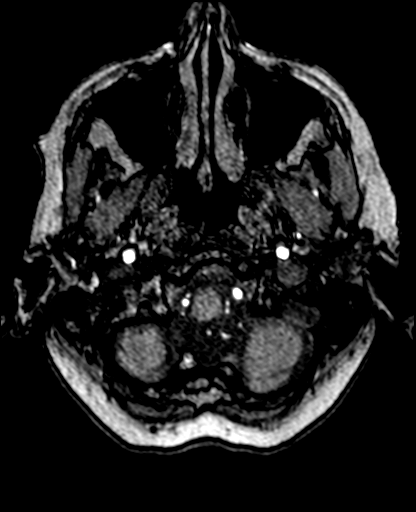
[im 22/172]
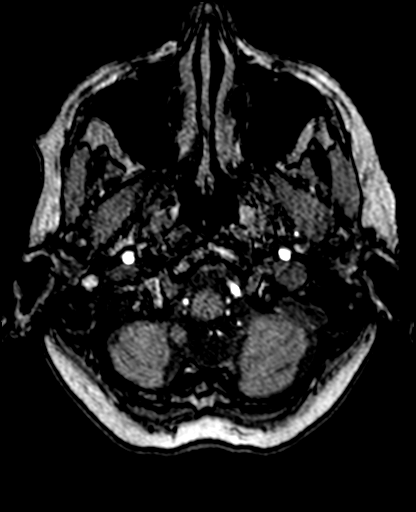
[im 26/172]
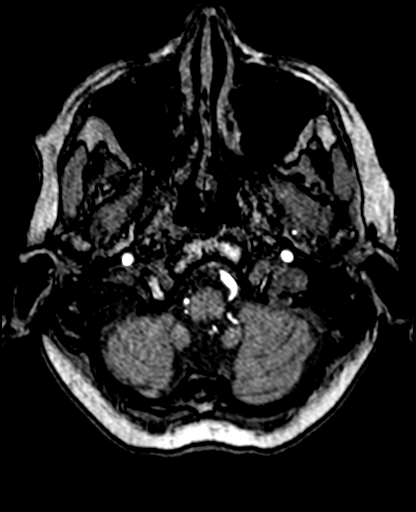
[im 30/172]
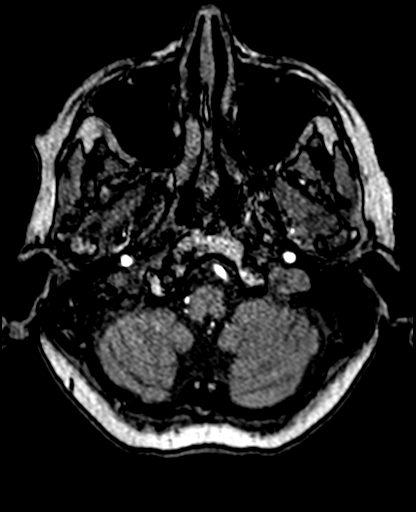
[im 33/172]
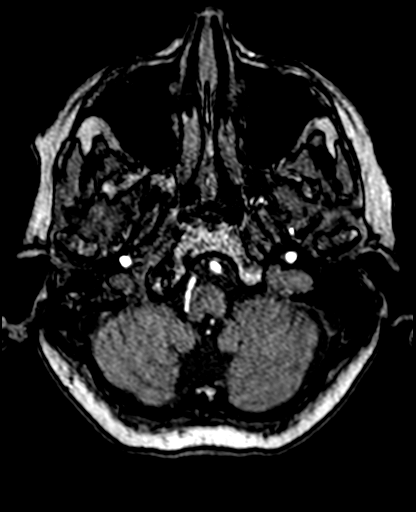
[im 37/172]
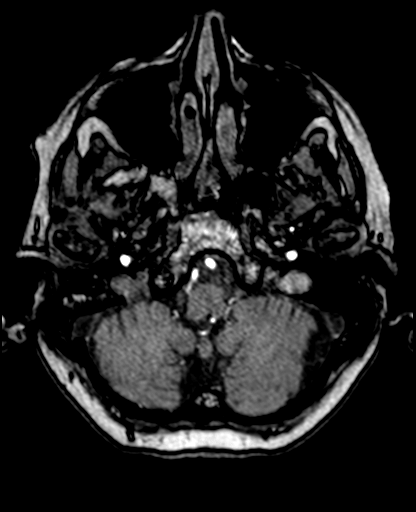
[im 41/172]
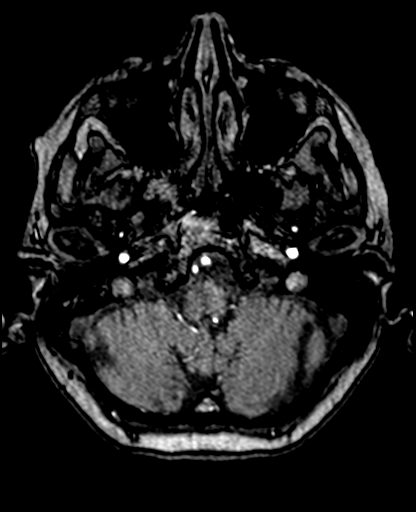
[im 44/172]
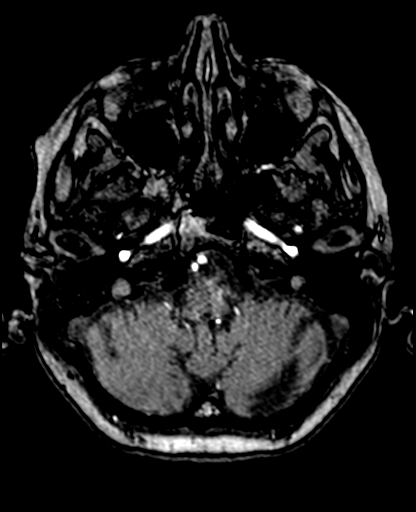
[im 48/172]
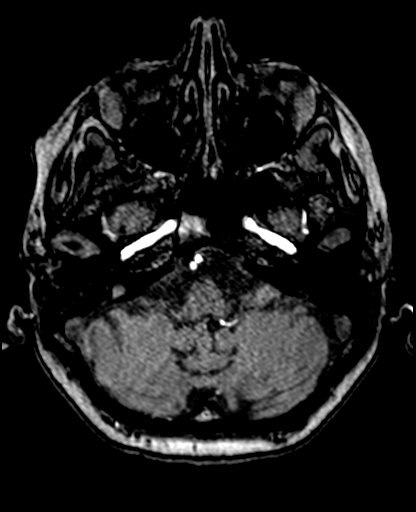
[im 55/172]
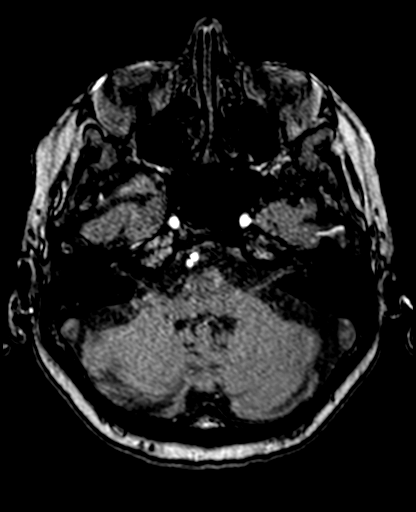
[im 77/172]
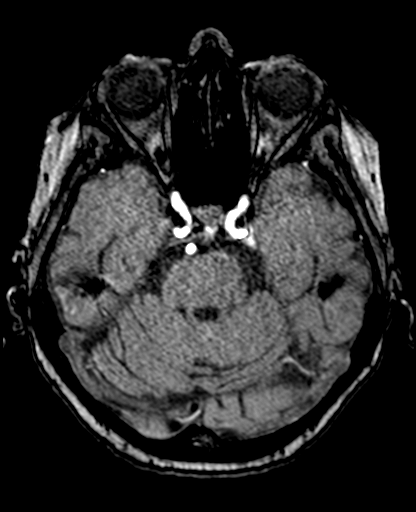
[im 88/172]
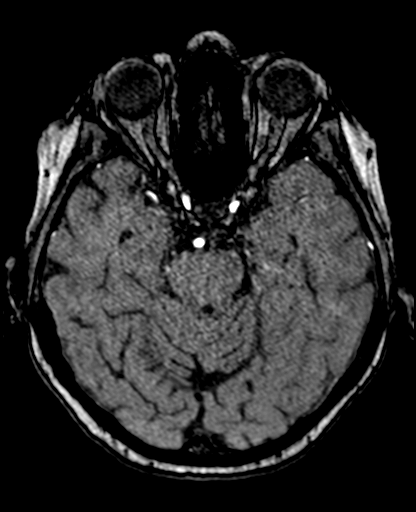
[im 99/172]
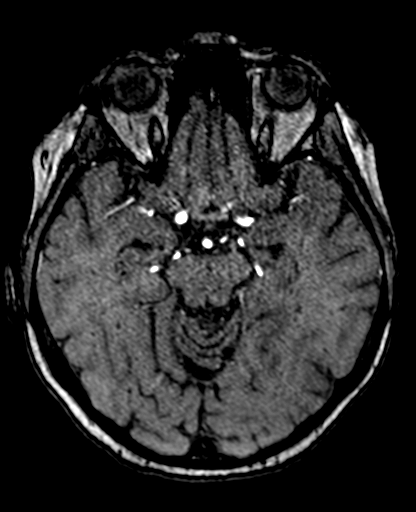
[im 121/172]
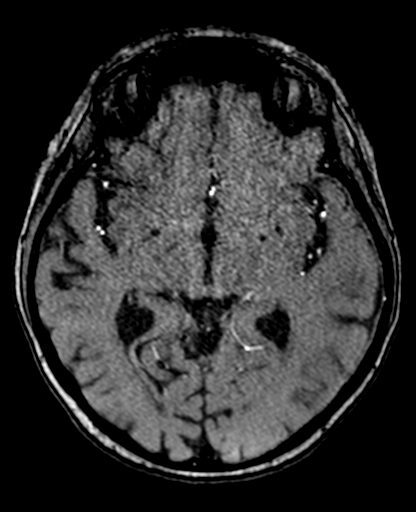
[im 142/172]
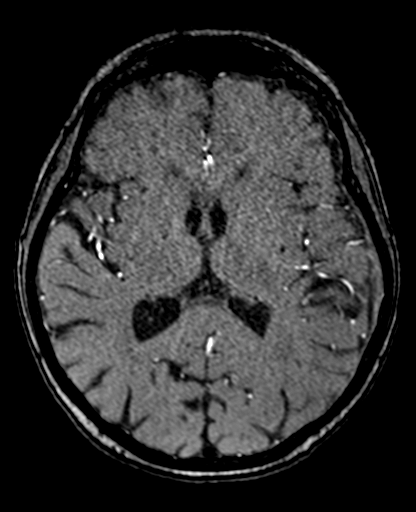
[im 146/172]
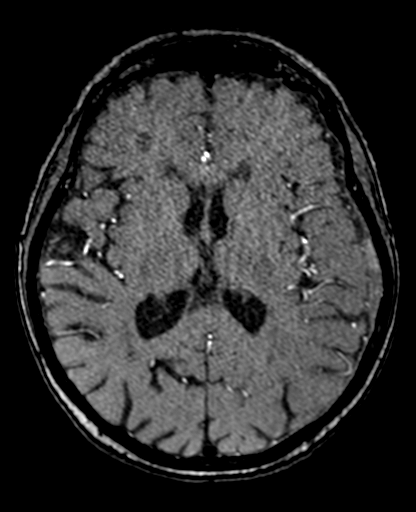
[im 164/172]
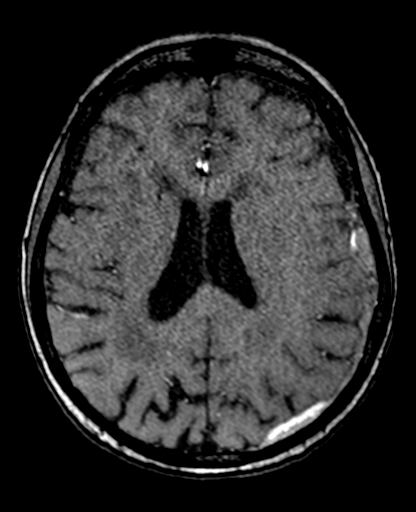

[22 of 48 positions shown; findings below may reference images not displayed]

FINDINGS: MRI HEAD FINDINGS

Brain: No acute infarct. Redemonstrated complex left cerebral
convexity extra-axial hemorrhage, measuring up to approximately 13
mm posteriorly (series 17, image 6). Areas of T1 hyperintensity
within the collection correlate with areas of hyperdensity seen on
recent CT head and are compatible with recent hemorrhage. There is
associated local mass effect without midline shift. Basal cisterns
are patent. No hydrocephalus. There is scattered T2/FLAIR
hyperintensity within the white matter, compatible with chronic
microvascular ischemic disease. T2/FLAIR hyperintensities within
bilateral basal ganglia, favored to reflect dilated perivascular
spaces

Vascular: Flow voids are maintained at the skull base.

Skull and upper cervical spine: Normal marrow signal.

Sinuses/Orbits: Mild ethmoid air cell mucosal thickening. No
air-fluid levels. No acute orbital abnormality.

Other: Moderate right and small left mastoid effusions.

MRA HEAD FINDINGS

Anterior circulation: Petrous and cavernous carotids are patent.
There is mild left greater than right paraclinoid ICA narrowing.
Bilateral M1 and proximal M2 MCAs are patent. Bilateral A1 and A2
ACA is are patent. No evidence of aneurysm. No specific evidence of
vascular malformation or aneurysm.

Posterior circulation. There is mild narrowing of the right V4
vertebral artery. The basilar artery is patent without evidence of
significant stenosis. Bilateral posterior cerebral arteries are
patent without significant stenosis or occlusion. No specific
evidence of vascular malformation or aneurysm.

MRA NECK FINDINGS

Great vessel origins are patent. No evidence of significant
(greater than 50%) arterial stenosis or occlusion within the neck.

Mild narrowing at bilateral carotid bifurcations, likely related to
atherosclerosis without evidence of greater than 50% narrowing of
the common carotid arteries or internal carotid arteries.

Mildly left dominant vertebral artery. There is mild irregularity of
bilateral V3 vertebral arteries as well as bilateral cervical ICAs.
IMPRESSION: 1. No acute infarct.
2. Redemonstrated complex left cerebral convexity extra-axial
hemorrhage, measuring up to approximately 13 mm posteriorly. Areas
of T1 hyperintensity within the collection correlate with areas of
hyperdensity seen on recent CT head and are compatible with recent
hemorrhage. There is associated local mass effect without midline
shift.
3. No evidence of significant (greater than 50%) arterial stenosis
or occlusion within the head or neck.
4. Mild irregularity of bilateral V3 vertebral arteries as well as
bilateral cervical ICAs, which may relate to atherosclerosis or
fibromuscular dysplasia.

## 2022-09-02 ENCOUNTER — Encounter: Payer: Self-pay | Admitting: Adult Health

## 2022-09-02 ENCOUNTER — Non-Acute Institutional Stay: Payer: Medicare Other | Admitting: Adult Health

## 2022-09-02 VITALS — BP 128/84 | HR 66 | Temp 97.6°F | Resp 18 | Ht 64.0 in | Wt 153.4 lb

## 2022-09-02 DIAGNOSIS — E782 Mixed hyperlipidemia: Secondary | ICD-10-CM

## 2022-09-02 DIAGNOSIS — I10 Essential (primary) hypertension: Secondary | ICD-10-CM

## 2022-09-02 DIAGNOSIS — S065XAA Traumatic subdural hemorrhage with loss of consciousness status unknown, initial encounter: Secondary | ICD-10-CM

## 2022-09-02 DIAGNOSIS — E538 Deficiency of other specified B group vitamins: Secondary | ICD-10-CM | POA: Diagnosis not present

## 2022-09-02 DIAGNOSIS — K5901 Slow transit constipation: Secondary | ICD-10-CM

## 2022-09-02 DIAGNOSIS — G40209 Localization-related (focal) (partial) symptomatic epilepsy and epileptic syndromes with complex partial seizures, not intractable, without status epilepticus: Secondary | ICD-10-CM

## 2022-09-02 DIAGNOSIS — H3530 Unspecified macular degeneration: Secondary | ICD-10-CM

## 2022-09-02 NOTE — Progress Notes (Addendum)
Location:  [facility]  POS: Clinic  Provider: Royal Hawthorn, ANP  Code Status: DNR Goals of Care:     02/19/2022   11:14 AM  Advanced Directives  Does Patient Have a Medical Advance Directive? Yes  Type of Paramedic of [place];Living will  Does patient want to make changes to medical advance directive? No - Patient declined  Copy of [place] in Chart? Yes - validated most recent copy scanned in chart (See row information)     Chief Complaint  Patient presents with   Medical Management of Chronic Issues    4 month follow up   Immunizations    Discussed the need for Covid vaccine , shingles , and Tdap   Quality Metric Gaps    Discussed the need for Dexa scan    HPI: Patient is a 88 y.o. female seen today for medical management of chronic diseases.    Resides in [place], husband retired physician with memory loss and resides in the memory care unit  Hx of left SDH (not on anticoagulation and no fall) and complex partial seizure in 06/26/2020. Continues on Keppra and followed by neurology with no new issues.  Macular degeneration, right eye is almost blind. Now getting injections in the left eye to prevent further vision loss.   HTN controlled 128/84  HLD LDL 141 01/25/22, no hx of CVA or MI  MMSE: 30/30 2021 need repeat. No reports of memory issues. Doing well in AL which is appropriate due to her vision loss.   TSH 8.17 01/25/22  B 12 192 MCV 98.6 Hhb 13.1 01/25/22  LDL 141 TC 217 01/25/22  Average weight for the past 10 years is 145-155 Weighed 148 prior to this was stressed out and lost weight so she is now gaining back.  Wt Readings from Last 3 Encounters:  09/02/22 153 lb 6.4 oz (69.6 kg)  05/01/22 151 lb 9.6 oz (68.8 kg)  03/13/22 149 lb (67.6 kg)   TSH 6.07 04/29/22 denies any new weight gain or fatigue.   Had ocular migraine after covid booster decided not to get another shot.  Past Medical History:  Diagnosis Date    Focal seizures ([place])    Per [place] new patient packet/ Right hand    Hearing loss    Hyperlipidemia    Hypertension    Kidney stone    Macular degeneration    Per [facility] new patient packet   Nephrolithiasis    SDH (subdural hematoma) ([place])     Past Surgical History:  Procedure Laterality Date   CATARACT EXTRACTION  1997   Per [place] new patient packet   KIDNEY STONE SURGERY     Per [place] new patient packet   [place]  2010   SHOULDER SURGERY  2008   [employer] amnesia  2020    Allergies  Allergen Reactions   Scallops [Shellfish Allergy] Hives and Shortness Of Breath   Sulfa Antibiotics Rash    Outpatient Encounter Medications as of 09/02/2022  Medication Sig   Cyanocobalamin (VITAMIN B 12 PO) Take 1,000 mcg/day by mouth daily.   latanoprost (XALATAN) 0.005 % ophthalmic solution Place 1 drop into both eyes at bedtime.   levETIRAcetam (KEPPRA) 500 MG tablet Take 1 tablet (500 mg total) by mouth 2 (two) times daily.   metoprolol succinate (TOPROL-XL) 50 MG 24 hr tablet Take 1 tablet (50 mg total) by mouth daily. Take with  or immediately following a meal.   Multiple Vitamins-Minerals (PRESERVISION AREDS) CAPS Take 1 capsule by mouth in the morning and at bedtime.   ramipril (ALTACE) 10 MG capsule Take 1 capsule (10 mg total) by mouth daily.   ramipril (ALTACE) 2.5 MG capsule Take 1 capsule (2.5 mg total) by mouth daily. Take with ramipril 10 mg daily   sennosides-docusate sodium (SENOKOT-S) 8.6-50 MG tablet Take 1 tablet by mouth as needed for constipation.   No facility-administered encounter medications on file as of 09/02/2022.    Review of Systems:  Review of Systems  Constitutional:  Negative for activity change, appetite change, chills, diaphoresis, fatigue, fever and unexpected weight change.  HENT:  Negative for congestion.   Eyes:  Positive for visual disturbance (follows with ophthalmology).  Respiratory:  Negative for  cough, shortness of breath and wheezing.   Cardiovascular:  Negative for chest pain, palpitations and leg swelling.  Gastrointestinal:  Negative for abdominal distention, abdominal pain, constipation and diarrhea.  Genitourinary:  Negative for difficulty urinating and dysuria.  Musculoskeletal:  Negative for arthralgias, back pain, gait problem, joint swelling and myalgias.  Neurological:  Negative for dizziness, tremors, seizures, syncope, facial asymmetry, speech difficulty, weakness, light-headedness, numbness and headaches.  Psychiatric/Behavioral:  Negative for agitation, behavioral problems and confusion.     Health Maintenance  Topic Date Due   Zoster Vaccines- Shingrix (1 of 2) Never done   DEXA SCAN  Never done   DTaP/Tdap/Td (1 - Tdap) 01/11/2021   COVID-19 Vaccine (6 - 2023-24 season) 05/24/2022   Medicare Annual Wellness (AWV)  02/20/2023   Pneumonia Vaccine 75+ Years old  Completed   INFLUENZA VACCINE  Completed   HPV VACCINES  Aged Out    Physical Exam: Vitals:   09/02/22 1436  BP: 128/84  Pulse: 66  Resp: 18  Temp: 97.6 F (36.4 C)  TempSrc: Temporal  SpO2: 96%  Weight: 153 lb 6.4 oz (69.6 kg)  Height: 5\' 4"  (1.626 m)   Body mass index is 26.33 kg/m. Physical Exam Vitals and nursing note reviewed.  Constitutional:      General: She is not in acute distress.    Appearance: She is not diaphoretic.  HENT:     Head: Normocephalic and atraumatic.     Right Ear: Ear canal and external ear normal. There is impacted cerumen.     Left Ear: Tympanic membrane, ear canal and external ear normal. There is no impacted cerumen.     Nose: Nose normal.     Mouth/Throat:     Mouth: Mucous membranes are moist.     Pharynx: Oropharynx is clear.  Eyes:     Conjunctiva/sclera: Conjunctivae normal.     Pupils: Pupils are equal, round, and reactive to light.  Neck:     Vascular: No JVD.  Cardiovascular:     Rate and Rhythm: Normal rate and regular rhythm.     Heart  sounds: No murmur heard. Pulmonary:     Effort: Pulmonary effort is normal. No respiratory distress.     Breath sounds: Normal breath sounds. No wheezing.  Abdominal:     General: Bowel sounds are normal. There is no distension.     Palpations: Abdomen is soft.     Tenderness: There is no abdominal tenderness.  Musculoskeletal:     Right lower leg: No edema.     Left lower leg: No edema.  Skin:    General: Skin is warm and dry.  Neurological:     Mental Status:  She is alert and oriented to person, place, and time.  Psychiatric:        Mood and Affect: Mood normal.     Labs reviewed: Basic Metabolic Panel: Recent Labs    01/25/22 0000 04/29/22 0000 04/29/22 0500  NA 131*  --   --   K 4.6  --   --   CL 95*  --   --   CO2 25*  --   --   BUN 13  --   --   CREATININE 0.9  --   --   CALCIUM 9.9  --   --   TSH 8.17* 6.07* 6.07*   Liver Function Tests: Recent Labs    01/25/22 0000  AST 17  ALT 13  ALKPHOS 79  ALBUMIN 4.3   No results for input(s): "LIPASE", "AMYLASE" in the last 8760 hours. No results for input(s): "AMMONIA" in the last 8760 hours. CBC: Recent Labs    01/25/22 0000  WBC 3.9  HGB 13.1  HCT 39  PLT 247   Lipid Panel: Recent Labs    01/25/22 0000  CHOL 217*  HDL 61  LDLCALC 141  TRIG 80   Lab Results  Component Value Date   HGBA1C 5.6 06/26/2020    Procedures since last visit: No results found.  Assessment/Plan  1. B12 deficiency Lab Results  Component Value Date   VITAMINB12 192 01/25/2022  Started on B12 in May  2. Subclinical hypothyroidism No current symptoms Will recheck in 3 months   3. Primary hypertension Controlled Continue altace and metoprolol  4. SDH (subdural hematoma) (HCC) 06/26/2020 Followed by neurology, no acute issues.  Repeat MMSE  5. Partial symptomatic epilepsy with complex partial seizures, not intractable, without status epilepticus (HCC) No new seizures Continue kkeprra  6. 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 Mixed hyperlipidemia LDL 141 No hx of CAD and CVA Continue to monitor at this time Heart healthy diet.   Labs/tests ordered:  * No order type specified *TSH BMP CBC in am Next appt:  4 months with Dr Reece Agar   Total time :  time greater than 50% of total time spent doing pt counseling and coordination of care

## 2022-09-03 LAB — BASIC METABOLIC PANEL
BUN: 14 (ref 4–21)
CO2: 21 (ref 13–22)
Chloride: 99 (ref 99–108)
Creatinine: 1 (ref 0.5–1.1)
Glucose: 131
Potassium: 4.6 mEq/L (ref 3.5–5.1)
Sodium: 133 — AB (ref 137–147)

## 2022-09-03 LAB — COMPREHENSIVE METABOLIC PANEL
Calcium: 9.5 (ref 8.7–10.7)
eGFR: 55

## 2022-09-03 LAB — CBC AND DIFFERENTIAL
HCT: 37 (ref 36–46)
Hemoglobin: 12.7 (ref 12.0–16.0)
Platelets: 240 10*3/uL (ref 150–400)
WBC: 4.4

## 2022-09-03 LAB — TSH: TSH: 6.8 — AB (ref 0.41–5.90)

## 2022-09-03 LAB — CBC: RBC: 3.73 — AB (ref 3.87–5.11)

## 2022-09-04 DIAGNOSIS — E538 Deficiency of other specified B group vitamins: Secondary | ICD-10-CM | POA: Insufficient documentation

## 2022-09-07 IMAGING — CT CT HEAD W/O CM
4 series · 16 of 47 positions shown, 18 images · non-contrast
Comparison: 06/26/2020

CLINICAL DATA: Follow-up subdural hematoma.

EXAM:
CT HEAD WITHOUT CONTRAST
TECHNIQUE: Contiguous axial images were obtained from the base of the skull
through the vertex without intravenous contrast.

[Series 2: head 5.00 hr40 s3 axial ibhc · axial · 0.39mm/px · z∈[-659,-544]mm · 6 of 33 slices shown, 8 images]
[im 5/33  brain]
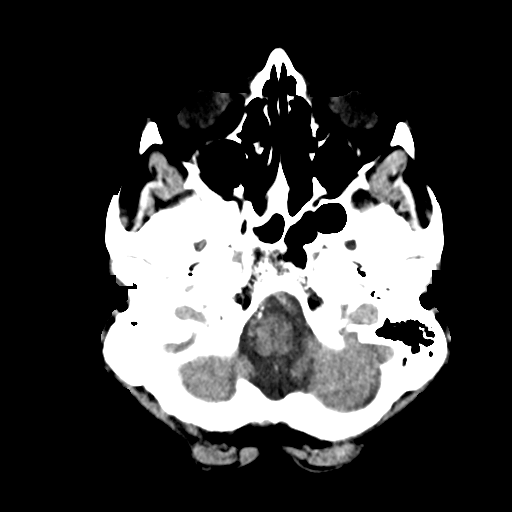
[im 5/33  bone]
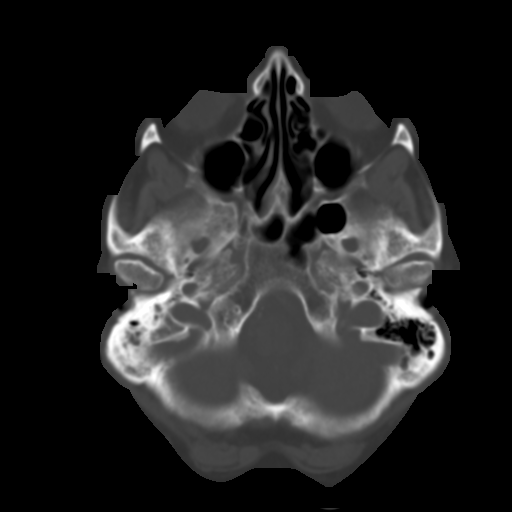
[im 10/33  brain]
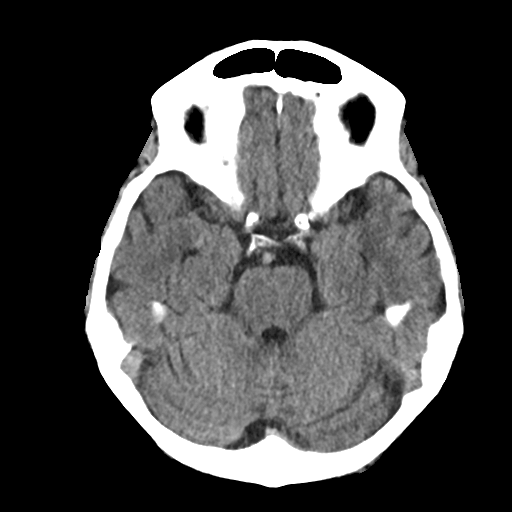
[im 14/33  brain]
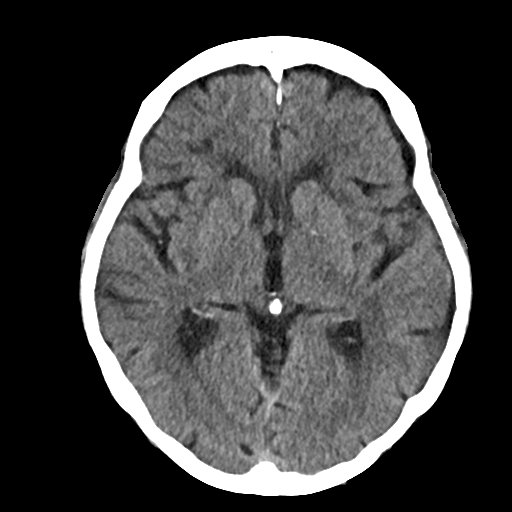
[im 19/33  brain]
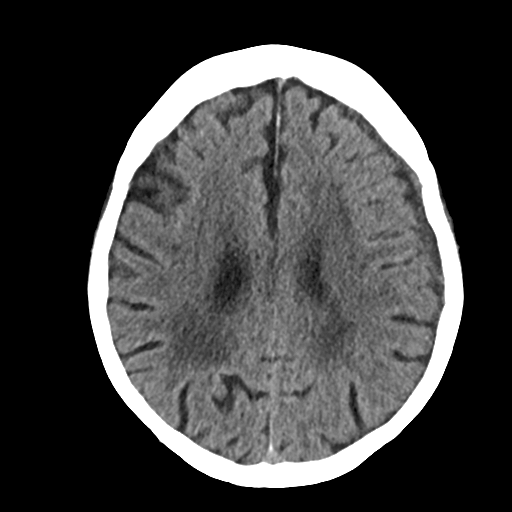
[im 23/33  brain]
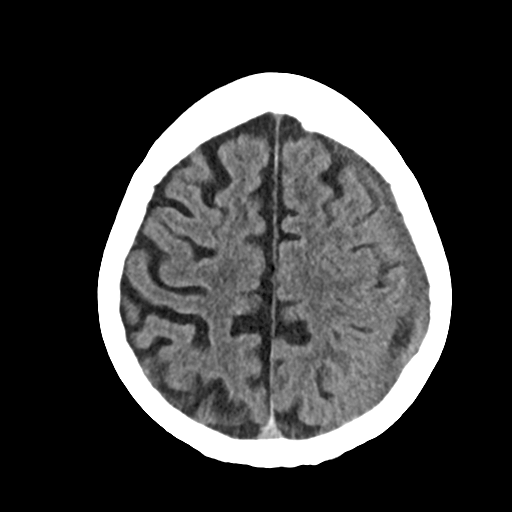
[im 23/33  bone]
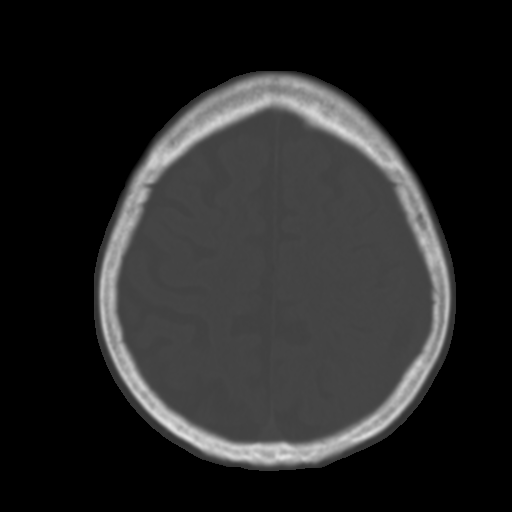
[im 28/33  brain]
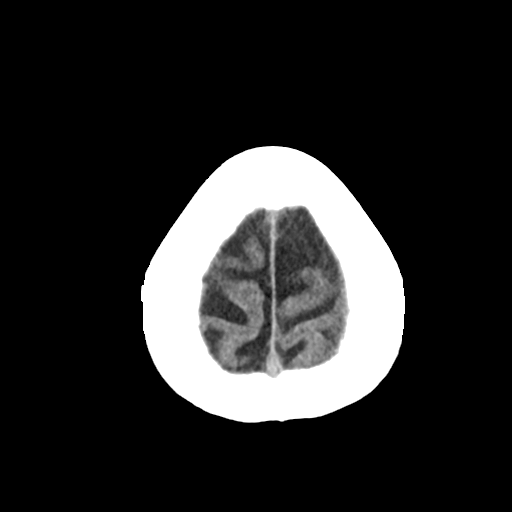

[Series 3: head 2.00 hr60 s3 axial bone · axial · 0.39mm/px · z∈[-667,-611]mm · 4 of 84 slices shown]
[im 8/84  bone]
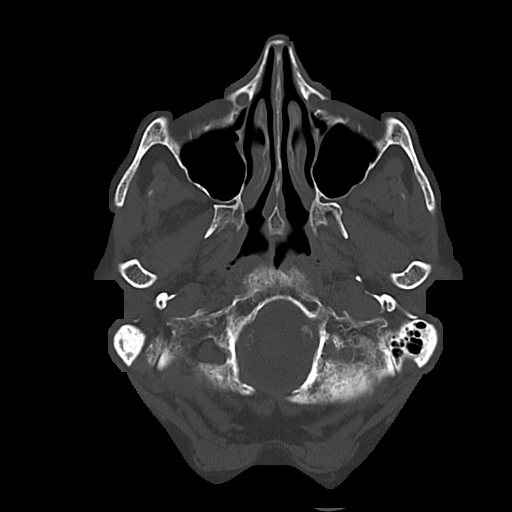
[im 16/84  bone]
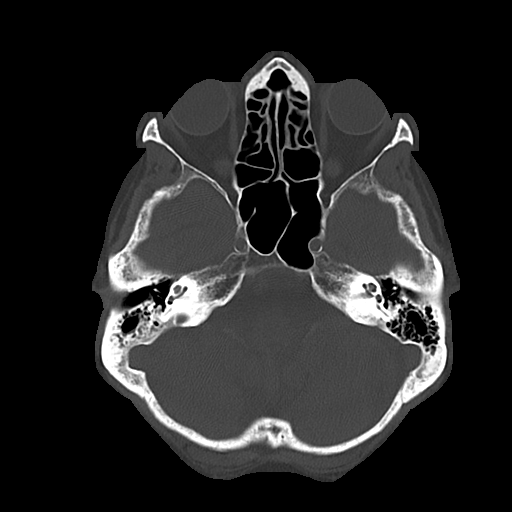
[im 28/84  bone]
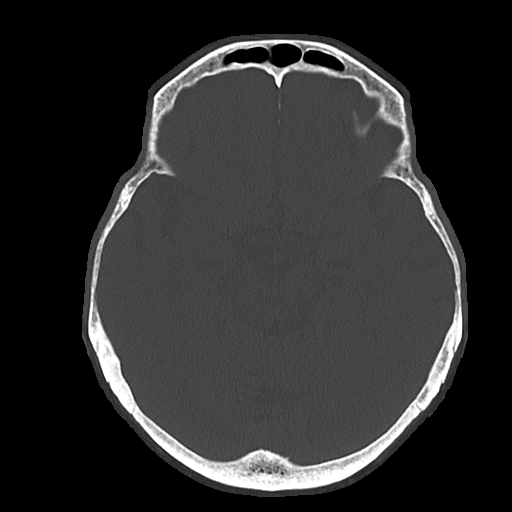
[im 36/84  bone]
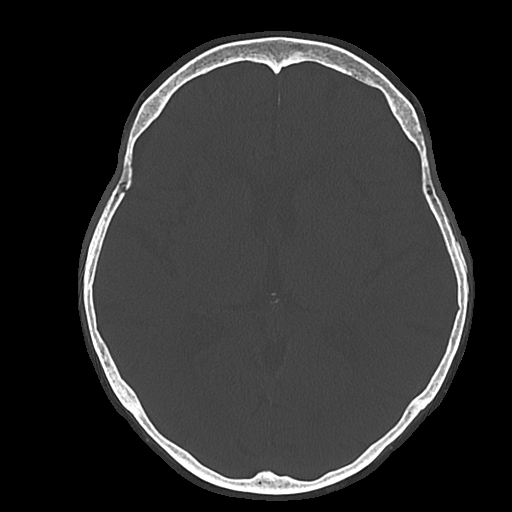

[Series 4: head 3.00 hr40 s3 sag · sagittal · 0.34mm/px · 3 of 89 slices shown]
[im 30/89  brain]
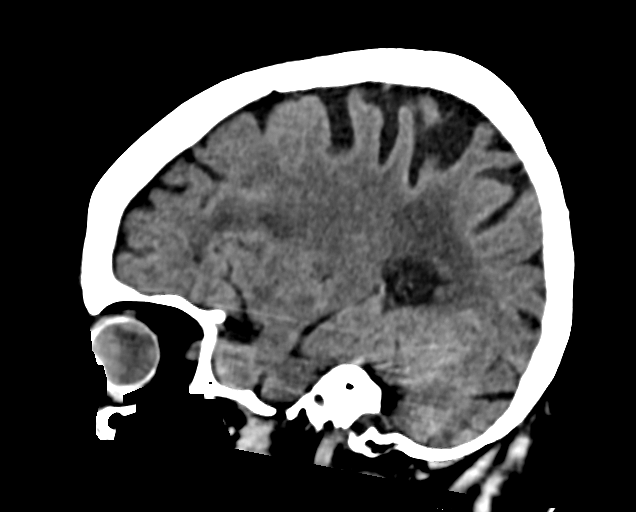
[im 45/89  brain]
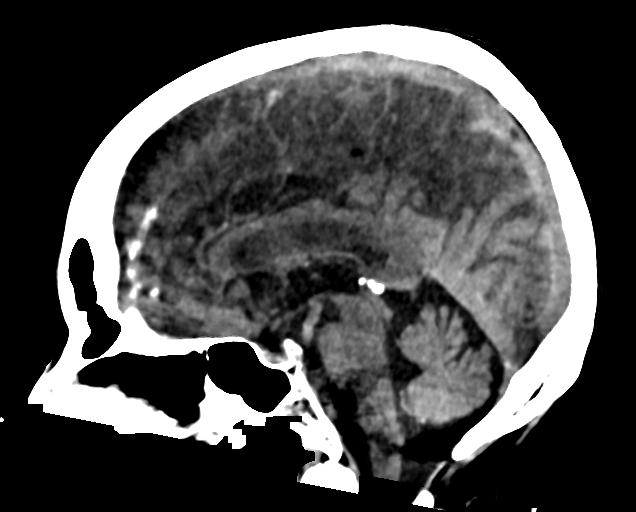
[im 59/89  brain]
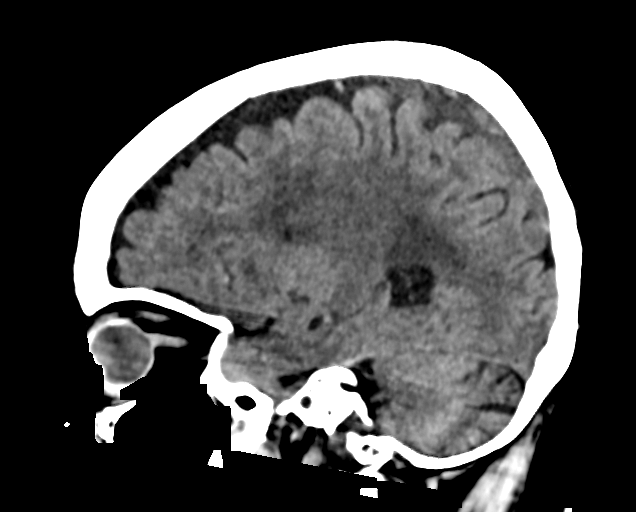

[Series 6: head 3.00 hr40 s3 cor · coronal · 0.33mm/px · 3 of 110 slices shown]
[im 37/110  brain]
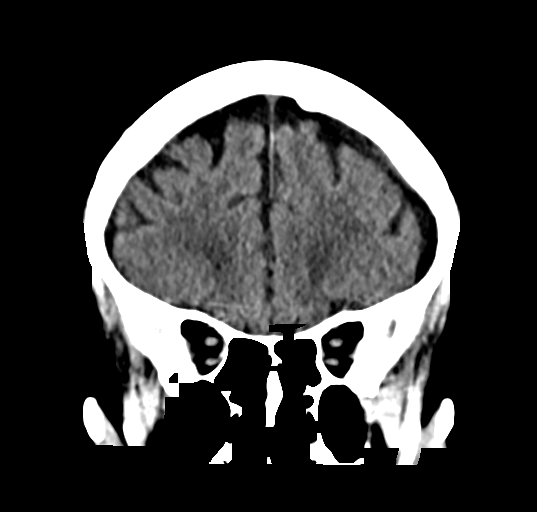
[im 49/110  brain]
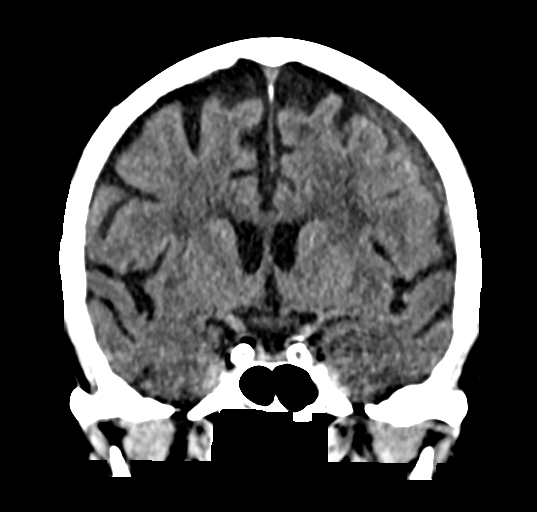
[im 61/110  brain]
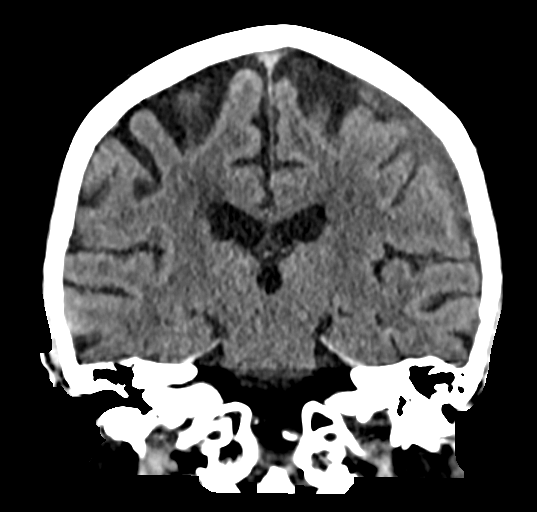

[16 of 47 positions shown; findings below may reference images not displayed]

FINDINGS: Brain: A mixed but largely intermediate density subdural hematoma
over the left cerebral convexity has decreased in size, now with a
maximal thickness of 11 mm in the left parietal region. Associated
mass effect on the left cerebral hemisphere has decreased. There is
no midline shift. No new intracranial hemorrhage, acute infarct, or
mass is identified. Hypodensities in the cerebral white matter
bilaterally are unchanged and nonspecific but compatible with
moderate chronic small vessel ischemic disease. A chronic lacunar
infarct is again noted in the left putamen/external capsule. Mild
cerebral atrophy is within normal limits for age.

Vascular: Calcified atherosclerosis at the skull base. No hyperdense
vessel.

Skull: No fracture or suspicious osseous lesion.

Sinuses/Orbits: Visualized paranasal sinuses are clear. Small
chronic right mastoid effusion. Bilateral cataract extraction.

Other: None.
IMPRESSION: 1. Decreased size of left-sided subdural hematoma.
2. No evidence of new intracranial abnormality.
3. Moderate chronic small vessel ischemic disease.

## 2022-10-10 ENCOUNTER — Telehealth: Payer: Medicare Other | Admitting: Adult Health

## 2022-10-10 DIAGNOSIS — U071 COVID-19: Secondary | ICD-10-CM

## 2022-10-10 MED ORDER — NIRMATRELVIR/RITONAVIR (PAXLOVID)TABLET: 3.0000 | ORAL_TABLET | Freq: Two times a day (BID) | ORAL | 0 refills | Status: AC

## 2022-10-10 NOTE — Telephone Encounter (Signed)
Pt tested positive for covid on rapid antigen testing with mild symptoms of cough and runny nose. Paxlovid prescribed.

## 2022-11-07 ENCOUNTER — Non-Acute Institutional Stay: Payer: Medicare Other | Admitting: Adult Health

## 2022-11-07 ENCOUNTER — Encounter: Payer: Self-pay | Admitting: Adult Health

## 2022-11-07 DIAGNOSIS — I480 Paroxysmal atrial fibrillation: Secondary | ICD-10-CM | POA: Diagnosis not present

## 2022-11-07 DIAGNOSIS — I10 Essential (primary) hypertension: Secondary | ICD-10-CM

## 2022-11-07 DIAGNOSIS — E038 Other specified hypothyroidism: Secondary | ICD-10-CM

## 2022-11-07 MED ORDER — METOPROLOL SUCCINATE ER 50 MG PO TB24: 75.0000 mg | ORAL_TABLET | Freq: Every day | ORAL | 1 refills | Status: AC

## 2022-11-07 NOTE — Progress Notes (Signed)
Location:  [facility] Room Number: [id number] Place of Service:  ALF [phone]) Provider:  Royal Hawthorn, NP   Patient Care Team: Virgie Dad, MD as PCP - General (Internal Medicine) Pa, [place] as Consulting Physician (Neurosurgery) Marcial Pacas, MD as Consulting Physician (Neurology)  Extended Emergency Contact Information Primary Emergency Contact: Thielman,CECIL Address: [address]          [place], [place] [address] Johnnette Litter of [place] Phone: [phone] Relation: Spouse Secondary Emergency Contact: Melynda Keller Address: [address]          [place], [place] [address] Johnnette Litter of [place] Phone: [phone] Mobile Phone: [phone] Relation: Daughter  Code Status:  DNR Goals of care: Advanced Directive information    11/07/2022    9:26 AM  Advanced Directives  Does Patient Have a Medical Advance Directive? Yes  Type of Paramedic of [place];Living will;Out of facility DNR (pink MOST or yellow form)  Does patient want to make changes to medical advance directive? No - Patient declined  Copy of [place] in Chart? Yes - validated most recent copy scanned in chart (See row information)     Chief Complaint  Patient presents with   Acute Visit     Low Blood Pressure    HPI:  Pt is a 89 y.o. female seen today for an acute visit for low blood pressure.   On 2/12 she reported low bp in the [id number] systolic. She said for the past week she had felt dizzy and was requesting a dose reduce in her bp med. Altace was reduced to 5 mg. Now BP 126/86.  Did have one reading of 0000000 systolic. No longer feels dizzy. Had Covid in late January with mild symptoms. When I reviewed her records in matrix her HR had been running 69-112.  She does not have any sob or chest pain. No palpitations.   Reports that she had tachycardia years ago and was put on  toprol and followed by cardiology when she lived at a [place] altitude in [place]. Not sure if it was afib.   Last EKG in epic was from 2021 showing SR.  Ordered EKG 12 lead showing afib HR in 80 11/07/22  She has a hx of left SDH (not on anticoagulation and no fall) and complex partial seizure in 06/26/2020. Continues on Keppra and followed by neurology with no new issues.   Past Medical History:  Diagnosis Date   Focal seizures ([place])    Per [facility] new patient packet/ Right hand    Hearing loss    Hyperlipidemia    Hypertension    Kidney stone    Macular degeneration    Per [facility] new patient packet   Nephrolithiasis    SDH (subdural hematoma) ([place])    Past Surgical History:  Procedure Laterality Date   CATARACT EXTRACTION  1997   Per [facility] new patient packet   KIDNEY STONE SURGERY     Per [facility] new patient packet   [place], [place]  2010   SHOULDER SURGERY  2008   [employer] amnesia  2020    Allergies  Allergen Reactions   Scallops [Shellfish Allergy] Hives and Shortness Of Breath   Sulfa Antibiotics Rash    Outpatient Encounter Medications as of 11/07/2022  Medication Sig   acetaminophen (TYLENOL) 325 MG tablet Take 650 mg by mouth every 3 (  three) hours as needed for moderate pain.   Cyanocobalamin (VITAMIN B 12 PO) Take 1,000 mcg/day by mouth daily.   latanoprost (XALATAN) 0.005 % ophthalmic solution Place 1 drop into both eyes at bedtime.   levETIRAcetam (KEPPRA) 500 MG tablet Take 1 tablet (500 mg total) by mouth 2 (two) times daily.   metoprolol succinate (TOPROL-XL) 50 MG 24 hr tablet Take 1 tablet (50 mg total) by mouth daily. Take with or immediately following a meal.   Multiple Vitamins-Minerals (PRESERVISION AREDS) CAPS Take 1 capsule by mouth in the morning and at bedtime.   ramipril (ALTACE) 5 MG capsule Take 5 mg by mouth daily. Hold for systolic reading of blood pressure below 90.   sennosides-docusate sodium (SENOKOT-S)  8.6-50 MG tablet Take 1 tablet by mouth as needed for constipation.   [DISCONTINUED] ramipril (ALTACE) 10 MG capsule Take 1 capsule (10 mg total) by mouth daily.   [DISCONTINUED] ramipril (ALTACE) 2.5 MG capsule Take 1 capsule (2.5 mg total) by mouth daily. Take with ramipril 10 mg daily   No facility-administered encounter medications on file as of 11/07/2022.    Review of Systems  Constitutional:  Negative for activity change, appetite change, chills, diaphoresis, fatigue, fever and unexpected weight change.  HENT:  Negative for congestion.   Eyes:        Reduced vision  Respiratory:  Negative for cough, shortness of breath and wheezing.   Cardiovascular:  Negative for chest pain, palpitations and leg swelling.  Gastrointestinal:  Negative for abdominal distention, abdominal pain, constipation and diarrhea.  Genitourinary:  Negative for difficulty urinating and dysuria.  Musculoskeletal:  Negative for arthralgias, back pain, gait problem, joint swelling and myalgias.  Neurological:  Positive for dizziness (which resolved). Negative for tremors, seizures, syncope, facial asymmetry, speech difficulty, weakness, light-headedness, numbness and headaches.  Psychiatric/Behavioral:  Negative for agitation, behavioral problems and confusion.     Immunization History  Administered Date(s) Administered   Fluad Quad(high Dose 65+) 06/29/2020, 06/28/2022   Influenza-Unspecified 07/02/2021   Moderna Sars-Covid-2 Vaccination 10/01/2019, 10/29/2019, 08/08/2020, 07/24/2021   Pneumococcal Conjugate-13 09/24/2015   Pneumococcal Polysaccharide-23 09/23/2017   Tdap 02/01/2021   Tetanus 01/10/2021   Unspecified SARS-COV-2 Vaccination 02/07/2022   Pertinent  Health Maintenance Due  Topic Date Due   DEXA SCAN  09/03/2023 (Originally 11/07/1995)   INFLUENZA VACCINE  Completed      05/09/2021    9:50 AM 10/01/2021   11:59 AM 01/28/2022    2:02 PM 02/19/2022   11:12 AM 11/07/2022    9:24 AM  [place] in the past year? 0 0 0 0 0  Was there an injury with Fall?  0 0 0 0  Fall Risk Category Calculator  0 0 0 0  Fall Risk Category (Retired)  Low Low Low   (RETIRED) Patient Fall Risk Level Low fall risk Low fall risk Moderate fall risk Low fall risk   Patient at Risk for Falls Due to  No Fall Risks Impaired balance/gait No Fall Risks No Fall Risks  Fall risk Follow up Falls evaluation completed Falls evaluation completed Falls evaluation completed Falls evaluation completed Falls evaluation completed   Functional Status Survey:    Vitals:   11/07/22 0906  BP: 126/86  Pulse: 80  Resp: 18  Temp: (!) 96.4 F (35.8 C)  SpO2: 97%  Weight: 148 lb 8 oz (67.4 kg)  Height: 5' 4"$  (1.626 m)   Body mass index is 25.49 kg/m. Physical Exam Vitals and nursing note  reviewed.  Constitutional:      General: She is not in acute distress.    Appearance: She is not diaphoretic.  HENT:     Head: Normocephalic and atraumatic.  Neck:     Vascular: No JVD.  Cardiovascular:     Rate and Rhythm: Normal rate. Rhythm irregular.     Heart sounds: No murmur heard. Pulmonary:     Effort: Pulmonary effort is normal. No respiratory distress.     Breath sounds: Normal breath sounds. No wheezing.  Abdominal:     General: Bowel sounds are normal. There is no distension.     Palpations: Abdomen is soft.     Tenderness: There is no abdominal tenderness.  Musculoskeletal:     Right lower leg: No edema.     Left lower leg: No edema.  Skin:    General: Skin is warm and dry.  Neurological:     Mental Status: She is alert and oriented to person, place, and time.     Labs reviewed: Recent Labs    01/25/22 0000 09/03/22 0600  NA 131* 133*  K 4.6 4.6  CL 95* 99  CO2 25* 21  BUN 13 14  CREATININE 0.9 1.0  CALCIUM 9.9 9.5   Recent Labs    01/25/22 0000  AST 17  ALT 13  ALKPHOS 79  ALBUMIN 4.3   Recent Labs    01/25/22 0000 09/03/22 0600  WBC 3.9 4.4  HGB 13.1 12.7  HCT 39 37   PLT 247 240   Lab Results  Component Value Date   TSH 6.80 (A) 09/03/2022   Lab Results  Component Value Date   HGBA1C 5.6 06/26/2020   Lab Results  Component Value Date   CHOL 217 (A) 01/25/2022   HDL 61 01/25/2022   LDLCALC 141 01/25/2022   TRIG 80 01/25/2022   CHOLHDL 3.3 06/26/2020    Significant Diagnostic Results in last 30 days:  No results found.  Assessment/Plan  1. Paroxysmal A-fib (HCC) Rate is above goal at times up to 110 Will increase Toprol to 75 mg Refer to cardiology [facility] heart care.  Not starting DOAC at this time, will defer to cardiology due to hx of SDH Pt is not symptomatic and doing well. Recently recovered from covid.  Discussed with patient the risk associated with afib such as CVA. She expressed understanding.   2. Subclinical hypothyroidism Has been slightly elevated with no prior symptoms, now in afib Will recheck Will likely need to Synthroid   3. Primary hypertension Improved with reduced ramipril    Family/ staff Communication: discussed with resident   Labs/tests ordered:  CBC CMP TSH Free T4 in am

## 2022-11-08 LAB — COMPREHENSIVE METABOLIC PANEL
Albumin: 3.7 (ref 3.5–5.0)
Calcium: 9.2 (ref 8.7–10.7)
Globulin: 2.1
eGFR: 55

## 2022-11-08 LAB — BASIC METABOLIC PANEL
BUN: 15 (ref 4–21)
CO2: 22 (ref 13–22)
Chloride: 98 — AB (ref 99–108)
Creatinine: 1 (ref 0.5–1.1)
Glucose: 105
Potassium: 4.7 mEq/L (ref 3.5–5.1)
Sodium: 135 — AB (ref 137–147)

## 2022-11-08 LAB — HEPATIC FUNCTION PANEL
ALT: 22 U/L (ref 7–35)
AST: 20 (ref 13–35)
Alkaline Phosphatase: 3.7 — AB (ref 25–125)
Bilirubin, Total: 0.2

## 2022-11-08 LAB — TSH: TSH: 5.61 (ref 0.41–5.90)

## 2022-12-06 NOTE — Progress Notes (Unsigned)
Cardiology Office Note:    Date:  12/09/2022   ID:  Nichole Cox, DOB 02/19/1929, MRN [id number]  PCP:  Nichole Dad, MD   [facility] Providers Cardiologist:  None     Referring MD: Nichole Dad, MD   Chief Complaint  Patient presents with   Atrial Fibrillation    History of Present Illness:    Nichole Cox is a 89 y.o. female seen at the request of Dr Lyndel Safe for evaluation of Afib. She has a history of HLD, HTN, and prior subdural hematoma. She notes several years ago she was seen by a cardiologist in [place] for arrhythmia. States her heart was beating fast. Doesn't recall ever being told she had Afib.   She was admitted in October 2021 with right arm weakness. Found to have SDH, left cerebral hemorrhage without prior trauma. Had some seizures associated with this. Echo in 2022 showed normal EF with mild to mod MR.   She reports she had Covid for the second time in Feb. Following this she was fatigued and had a couple spells of lightheadedness. She was seen by PCP and found to be in Afib. Was in NSR in 2021. Toprol dose was increased slightly. She is not aware of any palpitations or dyspnea. No chest pain. No syncope.   Her husband is a retired Industrial/product designer- currently in Air traffic controller at [employer]   Past Medical History:  Diagnosis Date   Focal seizures ([place])    Per [facility] new patient packet/ Right hand    Hearing loss    Hyperlipidemia    Hypertension    Kidney stone    Macular degeneration    Per [facility] new patient packet   Nephrolithiasis    SDH (subdural hematoma) ([place])     Past Surgical History:  Procedure Laterality Date   CATARACT EXTRACTION  1997   Per [facility] new patient packet   KIDNEY STONE SURGERY     Per [facility] new patient packet   [place], [place]  2010   SHOULDER SURGERY  2008   [employer] amnesia  2020    Current Medications: No outpatient medications have been marked as taking for  the 12/09/22 encounter (Office Visit) with [place], Mersedes Alber M, MD.     Allergies:   Scallops [shellfish allergy] and Sulfa antibiotics   Social History   Socioeconomic History   Marital status: Married    Spouse name: cecil   Number of children: 2   Years of education: college   Highest education level: Bachelor's degree (e.g., BA, AB, BS)  Occupational History   Occupation: Pharmacist, hospital    Comment: Retired / Dance movement psychotherapist   Tobacco Use   Smoking status: Former    Packs/day: 1.00    Years: 13.00    Additional pack years: 0.00    Total pack years: 13.00    Types: Cigarettes   Smokeless tobacco: Never  Vaping Use   Vaping Use: Never used  Substance and Sexual Activity   Alcohol use: Yes    Comment: 1-2 drinks of scotch per night   Drug use: Never   Sexual activity: Not Currently  Other Topics Concern   Not on file  Social History Narrative   Diet Healthy      Do you drink/eat things with caffeine coffee  Morning only      Marital Status Married            What  year were you married? 1955      Do you live in a house, apartment, assisted living, condo, trailer, etc.? Townhome      Is it one or more stories? 2 story      How many persons live in your home? 3         Do you have any pets in your home?(please list) Dog      Highest level of education completed: College degree      Current or past profession: Teacher      Do you exercise?:Yes    Type and how often: Walk, daily      Do you have a Living Will? (Form that indicates scenarios where you would not want your life prolonged) Yes      Do you have a DNR form?         If not, would you like to discuss one? No      Do you have signed POA/HPOA forms? Yes      Do you have difficulty bathing or dressing yourself? No      Do you have difficulty preparing food or eating? No      Do you have difficulty managing medications? No      Do you have difficulty managing your finances? No      Do you have difficulty affording  your medications? No      Lives at home with her husband (retired MD - Industrial/product designer) and daughter (retired Therapist, sports).   Right-handed.   1.5 cups per day.                  Social Determinants of Health   Financial Resource Strain: Not on file  Food Insecurity: Not on file  Transportation Needs: Not on file  Physical Activity: Not on file  Stress: Not on file  Social Connections: Not on file     Family History: The patient's family history includes Heart failure in her mother; Lung cancer in her father.  ROS:   Please see the history of present illness.     All other systems reviewed and are negative.  EKGs/Labs/Other Studies Reviewed:    The following studies were reviewed today: Echo 04/03/21: IMPRESSIONS     1. Left ventricular ejection fraction, by estimation, is 60 to 65%. The  left ventricle has normal function. The left ventricle has no regional  wall motion abnormalities. Left ventricular diastolic parameters are  indeterminate. Elevated left ventricular  end-diastolic pressure.   2. Right ventricular systolic function is normal. The right ventricular  size is normal. There is mildly elevated pulmonary artery systolic  pressure. The estimated right ventricular systolic pressure is 123456 mmHg.   3. The mitral valve is normal in structure. Mild to moderate mitral valve  regurgitation. No evidence of mitral stenosis.   4. The aortic valve is normal in structure. Aortic valve regurgitation is  not visualized. No aortic stenosis is present.   5. The inferior vena cava is normal in size with <50% respiratory  variability, suggesting right atrial pressure of 8 mmHg.   EKG:  EKG is nordered today.  The ekg ordered today demonstrates Afib with rate 86. Nonspecific ST abnormality. I have personally reviewed and interpreted this study.   Recent Labs: 01/25/2022: ALT 13 09/03/2022: BUN 14; Creatinine 1.0; Hemoglobin 12.7; Platelets 240; Potassium 4.6; Sodium 133; TSH 6.80   Recent Lipid Panel    Component Value Date/Time   CHOL 217 (A) 01/25/2022 0000   TRIG  80 01/25/2022 0000   HDL 61 01/25/2022 0000   CHOLHDL 3.3 06/26/2020 0253   VLDL 15 06/26/2020 0253   LDLCALC 141 01/25/2022 0000     Risk Assessment/Calculations:    CHA2DS2-VASc Score = 4   This indicates a 4.8% annual risk of stroke. The patient's score is based upon: CHF History: 0 HTN History: 1 Diabetes History: 0 Stroke History: 0 Vascular Disease History: 0 Age Score: 2 Gender Score: 1       Physical Exam:    VS:  BP (!) 140/78   Pulse 86   Ht 5\' 4"  (1.626 m)   Wt 153 lb 9.6 oz (69.7 kg)   SpO2 98%   BMI 26.37 kg/m     Wt Readings from Last 3 Encounters:  12/09/22 153 lb 9.6 oz (69.7 kg)  11/07/22 148 lb 8 oz (67.4 kg)  09/02/22 153 lb 6.4 oz (69.6 kg)     GEN: elderly,  Well nourished, well developed in no acute distress HEENT: Normal NECK: No JVD; No carotid bruits LYMPHATICS: No lymphadenopathy CARDIAC: IRRR, no murmurs, rubs, gallops RESPIRATORY:  Clear to auscultation without rales, wheezing or rhonchi  ABDOMEN: Soft, non-tender, non-distended MUSCULOSKELETAL:  No edema; No deformity  SKIN: Warm and dry NEUROLOGIC:  Alert and oriented x 3 PSYCHIATRIC:  Normal affect   ASSESSMENT:    1. Persistent atrial fibrillation ([place])   2. SDH (subdural hematoma) (HCC)   3. Primary hypertension    PLAN:    In order of problems listed above:  Persistent Afib. Duration really unknown. Rate currently controlled on Toprol. Minimal to no symptoms. Patient is not a candidate for anticoagulation due to history of spontaneous SDH. Not a candidate for Watchman LA occlusive device given advanced age. Will continue current therapy.  HTN. Controlled on current meds.            Medication Adjustments/Labs and Tests Ordered: Current medicines are reviewed at length with the patient today.  Concerns regarding medicines are outlined above.  No orders of the defined  types were placed in this encounter.  No orders of the defined types were placed in this encounter.   Patient Instructions  Medication Instructions:  Continue same medications *If you need a refill on your cardiac medications before your next appointment, please call your pharmacy*   Lab Work: None ordered   Testing/Procedures: None ordered   Follow-Up: At [facility], you and your health needs are our priority.  As part of our continuing mission to provide you with exceptional heart care, we have created designated Provider Care Teams.  These Care Teams include your primary Cardiologist (physician) and Advanced Practice Providers (APPs -  Physician Assistants and Nurse Practitioners) who all work together to provide you with the care you need, when you need it.  We recommend signing up for the patient portal called "MyChart".  Sign up information is provided on this After Visit Summary.  MyChart is used to connect with patients for Virtual Visits (Telemedicine).  Patients are able to view lab/test results, encounter notes, upcoming appointments, etc.  Non-urgent messages can be sent to your provider as well.   To learn more about what you can do with MyChart, go to [email or web address].    Your next appointment:  6 months    Provider:  Dr.Naisha Wisdom     Signed, Cristan Hout [place], MD  12/09/2022 2:27 PM    [place]

## 2022-12-09 ENCOUNTER — Ambulatory Visit: Payer: Medicare Other | Attending: Cardiology | Admitting: Cardiology

## 2022-12-09 ENCOUNTER — Encounter: Payer: Self-pay | Admitting: Cardiology

## 2022-12-09 VITALS — BP 140/78 | HR 86 | Ht 64.0 in | Wt 153.6 lb

## 2022-12-09 DIAGNOSIS — Z87820 Personal history of traumatic brain injury: Secondary | ICD-10-CM | POA: Diagnosis not present

## 2022-12-09 DIAGNOSIS — S065XAA Traumatic subdural hemorrhage with loss of consciousness status unknown, initial encounter: Secondary | ICD-10-CM | POA: Insufficient documentation

## 2022-12-09 DIAGNOSIS — I10 Essential (primary) hypertension: Secondary | ICD-10-CM | POA: Insufficient documentation

## 2022-12-09 DIAGNOSIS — I4819 Other persistent atrial fibrillation: Secondary | ICD-10-CM | POA: Insufficient documentation

## 2022-12-09 NOTE — Patient Instructions (Signed)
Medication Instructions:  Continue same medications *If you need a refill on your cardiac medications before your next appointment, please call your pharmacy*   Lab Work: None ordered   Testing/Procedures: None ordered   Follow-Up: At [facility], you and your health needs are our priority.  As part of our continuing mission to provide you with exceptional heart care, we have created designated Provider Care Teams.  These Care Teams include your primary Cardiologist (physician) and Advanced Practice Providers (APPs -  Physician Assistants and Nurse Practitioners) who all work together to provide you with the care you need, when you need it.  We recommend signing up for the patient portal called "MyChart".  Sign up information is provided on this After Visit Summary.  MyChart is used to connect with patients for Virtual Visits (Telemedicine).  Patients are able to view lab/test results, encounter notes, upcoming appointments, etc.  Non-urgent messages can be sent to your provider as well.   To learn more about what you can do with MyChart, go to [email or web address].    Your next appointment:  6 months    Provider:  Dr.Jordan

## 2023-01-07 ENCOUNTER — Encounter: Payer: Self-pay | Admitting: Internal Medicine

## 2023-01-07 ENCOUNTER — Non-Acute Institutional Stay: Payer: Medicare Other | Admitting: Internal Medicine

## 2023-01-07 VITALS — BP 138/78 | HR 85 | Temp 97.8°F | Resp 17 | Ht 64.0 in | Wt 153.6 lb

## 2023-01-07 DIAGNOSIS — E038 Other specified hypothyroidism: Secondary | ICD-10-CM

## 2023-01-07 DIAGNOSIS — G40209 Localization-related (focal) (partial) symptomatic epilepsy and epileptic syndromes with complex partial seizures, not intractable, without status epilepticus: Secondary | ICD-10-CM | POA: Diagnosis not present

## 2023-01-07 DIAGNOSIS — I480 Paroxysmal atrial fibrillation: Secondary | ICD-10-CM | POA: Diagnosis not present

## 2023-01-07 DIAGNOSIS — E538 Deficiency of other specified B group vitamins: Secondary | ICD-10-CM

## 2023-01-07 DIAGNOSIS — S065XAA Traumatic subdural hemorrhage with loss of consciousness status unknown, initial encounter: Secondary | ICD-10-CM

## 2023-01-07 DIAGNOSIS — H3530 Unspecified macular degeneration: Secondary | ICD-10-CM

## 2023-01-07 DIAGNOSIS — E782 Mixed hyperlipidemia: Secondary | ICD-10-CM

## 2023-01-07 DIAGNOSIS — N1831 Chronic kidney disease, stage 3a: Secondary | ICD-10-CM

## 2023-01-07 DIAGNOSIS — I10 Essential (primary) hypertension: Secondary | ICD-10-CM

## 2023-01-07 NOTE — Progress Notes (Signed)
Location:  [facility] features editor of Service:  Clinic (12)  Provider:   Code Status: DNR Goals of Care:     01/07/2023    1:46 PM  Advanced Directives  Does Patient Have a Medical Advance Directive? Yes  Type of Estate agent of [place];Living will  Does patient want to make changes to medical advance directive? No - Patient declined  Copy of Healthcare Power of Attorney in Chart? No - copy requested     Chief Complaint  Patient presents with   Medical Management of Chronic Issues    4 month follow up   Immunizations    Discussed the need for shingles vaccine. Patient declined today    Quality Metric Gaps    Discussed the need for AWV after 02/20/2023    HPI: Patient is a 89 y.o. female seen today for medical management of chronic diseases.    Lives in [place] in [place]  H/o PAF Seen by Dr [place] No Anticoagulation due to he rh/o SDH Patient very aware of her risk of Stroke Left subdural hematoma in 10/21 when she was admitted in the hospital with right arm weakness and slurred speech.  Etiology was unclear as she did not have any falls anticoagulation. 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 Has been on Keppra and has not had any issues Caregiver stress with her husband who is in memory care for worsening neurodegenerative disorder. 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  No Other issues today No Falls Walks with no assist  Past Medical History:  Diagnosis Date   Focal seizures    Per [facility] new patient packet/ Right hand    Hearing loss    Hyperlipidemia    Hypertension    Kidney stone    Macular degeneration    Per [facility] new patient packet   Nephrolithiasis    SDH (subdural hematoma)     Past Surgical History:  Procedure Laterality Date   CATARACT  EXTRACTION  1997   Per [facility] new patient packet   KIDNEY STONE SURGERY     Per [facility] new patient packet   KIDNEY STONE SURGERY  1959, 44, 33   9 CUFF REPAIR  2010   SHOULDER SURGERY  2008   [employer] amnesia  2020    Allergies  Allergen Reactions   Scallops [Shellfish Allergy] Hives and Shortness Of Breath   Sulfa Antibiotics Rash    Outpatient Encounter Medications as of 01/07/2023  Medication Sig   acetaminophen (TYLENOL) 325 MG tablet Take 650 mg by mouth 3 (three) times daily as needed for moderate pain.   Cyanocobalamin (VITAMIN B 12 PO) Take 1,000 mcg/day by mouth daily.   latanoprost (XALATAN) 0.005 % ophthalmic solution Place 1 drop into both eyes at bedtime.   levETIRAcetam (KEPPRA) 500 MG tablet Take 1 tablet (500 mg total) by mouth 2 (two) times daily.   metoprolol succinate (TOPROL-XL) 50 MG 24 hr tablet Take 1.5 tablets (75 mg total) by mouth daily. Take with or immediately following a meal.   Multiple Vitamins-Minerals (PRESERVISION AREDS) CAPS Take 1 capsule by mouth in the morning and at bedtime.   ramipril (ALTACE) 5 MG capsule Take 5 mg by mouth daily. Hold for systolic reading of blood pressure below 90.   [DISCONTINUED] sennosides-docusate sodium (SENOKOT-S) 8.6-50 MG tablet  Take 1 tablet by mouth as needed for constipation. (Patient not taking: Reported on 01/07/2023)   No facility-administered encounter medications on file as of 01/07/2023.    Review of Systems:  Review of Systems  Constitutional:  Negative for activity change and appetite change.  HENT: Negative.    Respiratory:  Negative for cough and shortness of breath.   Cardiovascular:  Negative for leg swelling.  Gastrointestinal:  Negative for constipation.  Genitourinary: Negative.   Musculoskeletal:  Negative for arthralgias, gait problem and myalgias.  Skin: Negative.   Neurological:  Negative for dizziness and weakness.  Psychiatric/Behavioral:  Negative for confusion, dysphoric mood and  sleep disturbance.     Health Maintenance  Topic Date Due   Zoster Vaccines- Shingrix (1 of 2) Never done   Medicare Annual Wellness (AWV)  02/20/2023   DEXA SCAN  09/03/2023 (Originally 11/07/1995)   INFLUENZA VACCINE  04/24/2023   DTaP/Tdap/Td (2 - Td or Tdap) 02/02/2031   Pneumonia Vaccine 74+ Years old  Completed   COVID-19 Vaccine  Completed   HPV VACCINES  Aged Out    Physical Exam: Vitals:   01/07/23 1348  BP: 138/78  Pulse: 85  Resp: 17  Temp: 97.8 F (36.6 C)  TempSrc: Temporal  SpO2: 98%  Weight: 153 lb 9.6 oz (69.7 kg)  Height: 5\' 4"  (1.626 m)   Body mass index is 26.37 kg/m. Physical Exam Vitals reviewed.  Constitutional:      Appearance: Normal appearance.  HENT:     Head: Normocephalic.     Right Ear: Tympanic membrane normal.     Left Ear: Tympanic membrane normal.     Nose: Nose normal.     Mouth/Throat:     Mouth: Mucous membranes are moist.     Pharynx: Oropharynx is clear.  Eyes:     Pupils: Pupils are equal, round, and reactive to light.  Cardiovascular:     Rate and Rhythm: Normal rate. Rhythm irregular.     Pulses: Normal pulses.     Heart sounds: Normal heart sounds. No murmur heard. Pulmonary:     Effort: Pulmonary effort is normal.     Breath sounds: Normal breath sounds.  Abdominal:     General: Abdomen is flat. Bowel sounds are normal.     Palpations: Abdomen is soft.  Musculoskeletal:        General: No swelling.     Cervical back: Neck supple.  Skin:    General: Skin is warm.  Neurological:     General: No focal deficit present.     Mental Status: She is alert and oriented to person, place, and time.  Psychiatric:        Mood and Affect: Mood normal.        Thought Content: Thought content normal.     Labs reviewed: Basic Metabolic Panel: Recent Labs    01/25/22 0000 04/29/22 0000 04/29/22 0500 09/03/22 0600  NA 131*  --   --  133*  K 4.6  --   --  4.6  CL 95*  --   --  99  CO2 25*  --   --  21  BUN 13  --    --  14  CREATININE 0.9  --   --  1.0  CALCIUM 9.9  --   --  9.5  TSH 8.17* 6.07* 6.07* 6.80*   Liver Function Tests: Recent Labs    01/25/22 0000  AST 17  ALT 13  ALKPHOS 79  ALBUMIN  4.3   No results for input(s): "LIPASE", "AMYLASE" in the last 8760 hours. No results for input(s): "AMMONIA" in the last 8760 hours. CBC: Recent Labs    01/25/22 0000 09/03/22 0600  WBC 3.9 4.4  HGB 13.1 12.7  HCT 39 37  PLT 247 240   Lipid Panel: Recent Labs    01/25/22 0000  CHOL 217*  HDL 61  LDLCALC 141  TRIG 80   Lab Results  Component Value Date   HGBA1C 5.6 06/26/2020    Procedures since last visit: No results found.  Assessment/Plan 1. Paroxysmal A-fib Metoprolol No DOCA due to h/o Brain Bleed  2. Subclinical hypothyroidism TSH slightly high T 4 normal  3. Primary hypertension BP controlled oN Ramipril and Toprol  4. Partial symptomatic epilepsy with complex partial seizures, not intractable, without status epilepticus Keppra  5. SDH (subdural hematoma)   6. B12 deficiency Supplement 7. Macular degeneration of both eyes, unspecified type Stable Follows with Opthalmologist  8. Mixed hyperlipidemia Refused statin  9. Stage 3a chronic kidney disease Creat stable    Labs/tests ordered:   Next appt:  02/24/2023

## 2023-02-24 ENCOUNTER — Non-Acute Institutional Stay (INDEPENDENT_AMBULATORY_CARE_PROVIDER_SITE_OTHER): Payer: Medicare Other | Admitting: Adult Health

## 2023-02-24 ENCOUNTER — Encounter: Payer: Self-pay | Admitting: Adult Health

## 2023-02-24 VITALS — BP 138/84 | HR 88 | Temp 97.6°F | Resp 18 | Ht 64.0 in | Wt 155.0 lb

## 2023-02-24 DIAGNOSIS — Z0000 Encounter for general adult medical examination without abnormal findings: Secondary | ICD-10-CM | POA: Diagnosis not present

## 2023-02-24 NOTE — Patient Instructions (Signed)
Nichole Cox , Thank you for taking time to come for your Medicare Wellness Visit. I appreciate your ongoing commitment to your health goals. Please review the following plan we discussed and let me know if I can assist you in the future.   Screening recommendations/referrals: Colonoscopy aged out Mammogram aged out Bone Density declined Recommended yearly ophthalmology/optometry visit for glaucoma screening and checkup Recommended yearly dental visit for hygiene and checkup  Vaccinations: Influenza vaccine- due annually in September/October Pneumococcal vaccine up to date Tdap vaccine up to date Shingles vaccine up to date    Advanced directives: reviewed  Conditions/risks identified: cardiac  Next appointment: 1 year   Preventive Care 87 Years and Older, Female Preventive care refers to lifestyle choices and visits with your health care provider that can promote health and wellness. What does preventive care include? A yearly physical exam. This is also called an annual well check. Dental exams once or twice a year. Routine eye exams. Ask your health care provider how often you should have your eyes checked. Personal lifestyle choices, including: Daily care of your teeth and gums. Regular physical activity. Eating a healthy diet. Avoiding tobacco and drug use. Limiting alcohol use. Practicing safe sex. Taking low-dose aspirin every day. Taking vitamin and mineral supplements as recommended by your health care provider. What happens during an annual well check? The services and screenings done by your health care provider during your annual well check will depend on your age, overall health, lifestyle risk factors, and family history of disease. Counseling  Your health care provider may ask you questions about your: Alcohol use. Tobacco use. Drug use. Emotional well-being. Home and relationship well-being. Sexual activity. Eating habits. History of falls. Memory and  ability to understand (cognition). Work and work Astronomer. Reproductive health. Screening  You may have the following tests or measurements: Height, weight, and BMI. Blood pressure. Lipid and cholesterol levels. These may be checked every 5 years, or more frequently if you are over 64 years old. Skin check. Lung cancer screening. You may have this screening every year starting at age 44 if you have a 30-pack-year history of smoking and currently smoke or have quit within the past 15 years. Fecal occult blood test (FOBT) of the stool. You may have this test every year starting at age 33. Flexible sigmoidoscopy or colonoscopy. You may have a sigmoidoscopy every 5 years or a colonoscopy every 10 years starting at age 27. Hepatitis C blood test. Hepatitis B blood test. Sexually transmitted disease (STD) testing. Diabetes screening. This is done by checking your blood sugar (glucose) after you have not eaten for a while (fasting). You may have this done every 1-3 years. Bone density scan. This is done to screen for osteoporosis. You may have this done starting at age 17. Mammogram. This may be done every 1-2 years. Talk to your health care provider about how often you should have regular mammograms. Talk with your health care provider about your test results, treatment options, and if necessary, the need for more tests. Vaccines  Your health care provider may recommend certain vaccines, such as: Influenza vaccine. This is recommended every year. Tetanus, diphtheria, and acellular pertussis (Tdap, Td) vaccine. You may need a Td booster every 10 years. Zoster vaccine. You may need this after age 74. Pneumococcal 13-valent conjugate (PCV13) vaccine. One dose is recommended after age 28. Pneumococcal polysaccharide (PPSV23) vaccine. One dose is recommended after age 39. Talk to your health care provider about which screenings and vaccines  you need and how often you need them. This information is  not intended to replace advice given to you by your health care provider. Make sure you discuss any questions you have with your health care provider. Document Released: 10/06/2015 Document Revised: 05/29/2016 Document Reviewed: 07/11/2015 Elsevier Interactive Patient Education  2017 [employer].  Fall Prevention in the Home Falls can cause injuries. They can happen to people of all ages. There are many things you can do to make your home safe and to help prevent falls. What can I do on the outside of my home? Regularly fix the edges of walkways and driveways and fix any cracks. Remove anything that might make you trip as you walk through a door, such as a raised step or threshold. Trim any bushes or trees on the path to your home. Use bright outdoor lighting. Clear any walking paths of anything that might make someone trip, such as rocks or tools. Regularly check to see if handrails are loose or broken. Make sure that both sides of any steps have handrails. Any raised decks and porches should have guardrails on the edges. Have any leaves, snow, or ice cleared regularly. Use sand or salt on walking paths during winter. Clean up any spills in your garage right away. This includes oil or grease spills. What can I do in the bathroom? Use night lights. Install grab bars by the toilet and in the tub and shower. Do not use towel bars as grab bars. Use non-skid mats or decals in the tub or shower. If you need to sit down in the shower, use a plastic, non-slip stool. Keep the floor dry. Clean up any water that spills on the floor as soon as it happens. Remove soap buildup in the tub or shower regularly. Attach bath mats securely with double-sided non-slip rug tape. Do not have throw rugs and other things on the floor that can make you trip. What can I do in the bedroom? Use night lights. Make sure that you have a light by your bed that is easy to reach. Do not use any sheets or blankets that  are too big for your bed. They should not hang down onto the floor. Have a firm chair that has side arms. You can use this for support while you get dressed. Do not have throw rugs and other things on the floor that can make you trip. What can I do in the kitchen? Clean up any spills right away. Avoid walking on wet floors. Keep items that you use a lot in easy-to-reach places. If you need to reach something above you, use a strong step stool that has a grab bar. Keep electrical cords out of the way. Do not use floor polish or wax that makes floors slippery. If you must use wax, use non-skid floor wax. Do not have throw rugs and other things on the floor that can make you trip. What can I do with my stairs? Do not leave any items on the stairs. Make sure that there are handrails on both sides of the stairs and use them. Fix handrails that are broken or loose. Make sure that handrails are as long as the stairways. Check any carpeting to make sure that it is firmly attached to the stairs. Fix any carpet that is loose or worn. Avoid having throw rugs at the top or bottom of the stairs. If you do have throw rugs, attach them to the floor with carpet tape. Make sure  that you have a light switch at the top of the stairs and the bottom of the stairs. If you do not have them, ask someone to add them for you. What else can I do to help prevent falls? Wear shoes that: Do not have high heels. Have rubber bottoms. Are comfortable and fit you well. Are closed at the toe. Do not wear sandals. If you use a stepladder: Make sure that it is fully opened. Do not climb a closed stepladder. Make sure that both sides of the stepladder are locked into place. Ask someone to hold it for you, if possible. Clearly mark and make sure that you can see: Any grab bars or handrails. First and last steps. Where the edge of each step is. Use tools that help you move around (mobility aids) if they are needed. These  include: Canes. Walkers. Scooters. Crutches. Turn on the lights when you go into a dark area. Replace any light bulbs as soon as they burn out. Set up your furniture so you have a clear path. Avoid moving your furniture around. If any of your floors are uneven, fix them. If there are any pets around you, be aware of where they are. Review your medicines with your doctor. Some medicines can make you feel dizzy. This can increase your chance of falling. Ask your doctor what other things that you can do to help prevent falls. This information is not intended to replace advice given to you by your health care provider. Make sure you discuss any questions you have with your health care provider. Document Released: 07/06/2009 Document Revised: 02/15/2016 Document Reviewed: 10/14/2014 Elsevier Interactive Patient Education  2017 [employer].

## 2023-02-24 NOTE — Progress Notes (Signed)
Subjective:   Nichole Cox is a 89 y.o. female who presents for Medicare Annual (Subsequent) preventive examination at [facility].   Review of Systems     Cardiac Risk Factors include: advanced age (>2men, >31 women)     Objective:    Today's Vitals   02/24/23 1336 02/24/23 1406  BP: 138/84   Pulse: 88   Resp: 18   Temp: 97.6 F (36.4 C)   TempSrc: Temporal   SpO2: 97%   Weight: 155 lb (70.3 kg)   Height: 5\' 4"  (1.626 m)   PainSc: 0-No pain 0-No pain   Body mass index is 26.61 kg/m.     02/24/2023    1:27 PM 01/07/2023    1:46 PM 01/07/2023    1:34 PM 11/07/2022    9:26 AM 09/02/2022    2:48 PM 02/19/2022   11:14 AM 01/28/2022    2:03 PM  Advanced Directives  Does Patient Have a Medical Advance Directive? Yes Yes Yes Yes Yes Yes Yes  Type of Estate agent of [place];Living will Healthcare Power of [place];Living will Healthcare Power of [place];Living will Healthcare Power of [place];Living will;Out of facility DNR (pink MOST or yellow form) Healthcare Power of [place];Living will;Out of facility DNR (pink MOST or yellow form) Healthcare Power of [place];Living will Living will  Does patient want to make changes to medical advance directive? No - Patient declined No - Patient declined  No - Patient declined No - Patient declined No - Patient declined No - Patient declined  Copy of Healthcare Power of Attorney in Chart? No - copy requested No - copy requested  Yes - validated most recent copy scanned in chart (See row information)  Yes - validated most recent copy scanned in chart (See row information)     Current Medications (verified) Outpatient Encounter Medications as of 02/24/2023  Medication Sig   acetaminophen (TYLENOL) 325 MG tablet Take 650 mg by mouth 3 (three) times daily as needed for moderate pain.   Cyanocobalamin (VITAMIN B 12 PO) Take 1,000 mcg/day by mouth daily.   latanoprost (XALATAN) 0.005 % ophthalmic  solution Place 1 drop into both eyes at bedtime.   levETIRAcetam (KEPPRA) 500 MG tablet Take 1 tablet (500 mg total) by mouth 2 (two) times daily.   metoprolol succinate (TOPROL-XL) 50 MG 24 hr tablet Take 1.5 tablets (75 mg total) by mouth daily. Take with or immediately following a meal.   Multiple Vitamins-Minerals (PRESERVISION AREDS) CAPS Take 1 capsule by mouth in the morning and at bedtime.   ramipril (ALTACE) 5 MG capsule Take 5 mg by mouth daily. Hold for systolic reading of blood pressure below 90.   No facility-administered encounter medications on file as of 02/24/2023.    Allergies (verified) Scallops [shellfish allergy] and Sulfa antibiotics   History: Past Medical History:  Diagnosis Date   Focal seizures (HCC)    Per [facility] new patient packet/ Right hand    Hearing loss    Hyperlipidemia    Hypertension    Kidney stone    Macular degeneration    Per [facility] new patient packet   Nephrolithiasis    SDH (subdural hematoma) (HCC)    Past Surgical History:  Procedure Laterality Date   CATARACT EXTRACTION  1997   Per [facility] new patient packet   KIDNEY STONE SURGERY     Per [facility] new patient packet   KIDNEY STONE SURGERY  1959, 76, 85   ROTATOR CUFF REPAIR  2010  SHOULDER SURGERY  2008   transglobal amnesia  2020   Family History  Problem Relation Age of Onset   Heart failure Mother        age 36   Lung cancer Father        age 46   Social History   Socioeconomic History   Marital status: Married    Spouse name: cecil   Number of children: 2   Years of education: college   Highest education level: Bachelor's degree (e.g., BA, AB, BS)  Occupational History   Occupation: Runner, 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 1955      Do you live in a house, apartment, assisted living, condo, trailer, etc.? Townhome      Is it one or more stories? 2 story      How many persons live in your home? 3         Do you have any pets in your home?(please list) Dog      Highest level of education completed: College degree      Current or past profession: Teacher      Do you exercise?:Yes    Type and how often: Walk, daily      Do you have a Living Will? (Form that indicates scenarios where you would not want your life prolonged) Yes      Do you have a DNR form?         If not, would you like to discuss one? No      Do you have signed POA/HPOA forms? Yes      Do you have difficulty bathing or dressing yourself? No      Do you have difficulty preparing food or eating? No      Do you have difficulty managing medications? No      Do you have difficulty managing your finances? No      Do you have difficulty affording your medications? No      Lives at home with her husband (retired MD - Sports administrator) and daughter (retired Charity fundraiser).   Right-handed.   1.5 cups per day.                  Social Determinants of Health   Financial Resource Strain: Not on file  Food Insecurity: Not on file  Transportation Needs: Not on file  Physical Activity: Not on file  Stress: Not on file  Social Connections: Not on file    Tobacco Counseling Counseling given: Not Answered   Clinical Intake:  Pre-visit preparation completed: No  Pain : No/denies pain Pain Score: 0-No pain     BMI - recorded: 26.6 Nutritional Status:  BMI 25 -29 Overweight Nutritional Risks: None Diabetes:  No  How often do you need to have someone help you when you read instructions, pamphlets, or other written materials from your doctor or pharmacy?: 1 - Never What is the last grade level you completed in school?: college  Diabetic?no  Interpreter Needed?: No  Information entered by :: Fletcher Anon NP   Activities of Daily Living    02/24/2023    2:16 PM 02/24/2023    1:34 PM  In your present state of health, do you have any difficulty performing the following activities:  Hearing? 1 1  Vision? 1 0  Difficulty concentrating or making decisions? 0 0  Walking or climbing stairs? 1 0  Dressing or bathing? 0 0  Doing errands, shopping? 0 0  Preparing Food and eating ? N   Using the Toilet? N   In the past six months, have you accidently leaked urine? N   Do you have problems with loss of bowel control? N   Managing your Medications? N   Managing your Finances? N   Housekeeping or managing your Housekeeping? Y     Patient Care Team: Mahlon Gammon, MD as PCP - General (Internal Medicine) [place], [facility] as Consulting Physician (Neurosurgery) Levert Feinstein, MD as Consulting Physician (Neurology)  Indicate any recent Medical Services you may have received from other than [facility] providers in the past year (date may be approximate).     Assessment:   This is a routine wellness examination for [place].  Hearing/Vision screen No results found.  Dietary issues and exercise activities discussed: Current Exercise Habits: Home exercise routine, Exercise limited by: None identified   Goals Addressed             This Visit's Progress    ambulate         Depression Screen    02/24/2023    1:34 PM 02/24/2023    1:26 PM 01/07/2023    1:47 PM 11/07/2022    9:24 AM 02/19/2022   11:07 AM 07/26/2020    2:33 PM 07/05/2020    1:45 PM  PHQ 2/9 Scores  PHQ - 2 Score 0 0 0 0 0 2 0  PHQ- 9 Score       1    Fall Risk    02/24/2023    1:26 PM 01/07/2023     1:44 PM 11/07/2022    9:24 AM 02/19/2022   11:12 AM 01/28/2022    2:02 PM  Fall Risk   Falls in the past year? 0 0 0 0 0  Number falls in past yr: 0 0 0 0 0  Injury with Fall? 0 0 0 0 0  Risk for fall due to : No Fall Risks No Fall Risks No Fall Risks No Fall Risks Impaired balance/gait  Follow up Falls evaluation completed  Falls evaluation completed Falls evaluation completed Falls evaluation completed    FALL RISK PREVENTION PERTAINING TO THE HOME:  Any stairs in or around the home? No  If so, are there any without handrails? No  Home free of loose throw rugs in walkways, pet beds, electrical cords, etc? Yes  Adequate lighting in your home to reduce risk of falls? Yes   ASSISTIVE DEVICES UTILIZED TO PREVENT FALLS:  Life alert? Yes  Use of a cane, walker or w/c? No  Grab bars in the bathroom? Yes  Shower chair or bench in shower? No  Elevated toilet seat or a  handicapped toilet? Yes   TIMED UP AND GO:  Was the test performed? Yes .  Length of time to ambulate 10 feet: 11 sec.   Gait slow and steady without use of assistive device  Cognitive Function:    02/24/2023    1:35 PM  MMSE - Mini Mental State Exam  Orientation to time 5  Orientation to Place 5  Registration 3  Attention/ Calculation 5  Recall 3  Language- name 2 objects 2  Language- repeat 1  Language- follow 3 step command 3  Language- read & follow direction 1  Write a sentence 1  Copy design 1  Total score 30        02/19/2022   11:15 AM  6CIT Screen  What Year? 0 points  What month? 0 points  What time? 0 points  Count back from 20 0 points  Months in reverse 0 points  Repeat phrase 0 points  Total Score 0 points    Immunizations Immunization History  Administered Date(s) Administered   Covid-19, Mrna,Vaccine(Spikevax)47yrs and older 07/30/2022   Fluad Quad(high Dose 65+) 06/29/2020, 06/28/2022   Influenza-Unspecified 07/02/2021   Moderna Sars-Covid-2 Vaccination 10/01/2019, 10/29/2019,  08/08/2020, 07/24/2021   Pneumococcal Conjugate-13 09/24/2015   Pneumococcal Polysaccharide-23 09/23/2017   Tdap 02/01/2021   Tetanus 01/10/2021   Unspecified SARS-COV-2 Vaccination 02/07/2022    TDAP status: Up to date  Flu Vaccine status: Up to date  Pneumococcal vaccine status: Up to date  Covid-19 vaccine status: Declined, Education has been provided regarding the importance of this vaccine but patient still declined. Advised may receive this vaccine at local pharmacy or Health Dept.or vaccine clinic. Aware to provide a copy of the vaccination record if obtained from local pharmacy or Health Dept. Verbalized acceptance and understanding.  Qualifies for Shingles Vaccine? Yes   Zostavax completed No   Shingrix Completed?: No.    Education has been provided regarding the importance of this vaccine. Patient has been advised to call insurance company to determine out of pocket expense if they have not yet received this vaccine. Advised may also receive vaccine at local pharmacy or Health Dept. Verbalized acceptance and understanding.  Screening Tests Health Maintenance  Topic Date Due   Zoster Vaccines- Shingrix (1 of 2) 05/27/2023 (Originally 11/06/1980)   DEXA SCAN  09/03/2023 (Originally 11/07/1995)   INFLUENZA VACCINE  04/24/2023   Medicare Annual Wellness (AWV)  02/24/2024   DTaP/Tdap/Td (2 - Td or Tdap) 02/02/2031   Pneumonia Vaccine 84+ Years old  Completed   COVID-19 Vaccine  Completed   HPV VACCINES  Aged Out    Health Maintenance  There are no preventive care reminders to display for this patient.   Colorectal cancer screening: No longer required.   Mammogram status: No longer required due to age.  Bone Density status: Completed na. Results reflect: Bone density results: NORMAL. Repeat every refused years. Has hx of elevated calcium and kidney stones. Doesn't want to take anything that would increase or change calcium  Lung Cancer Screening: (Low Dose CT Chest  recommended if Age 4-80 years, 30 pack-year currently smoking OR have quit w/in 15years.) does not qualify.   Lung Cancer Screening Referral: na  Additional Screening:  Hepatitis C Screening: does not qualify; Completed NA  Vision Screening: Recommended annual ophthalmology exams for early detection of glaucoma and other disorders of the eye. Is the patient up to date with their annual eye exam?  Yes  Who is the provider or what is the name of  the office in which the patient attends annual eye exams? Goes to retina specialist every two months and gets injections.  If pt is not established with a provider, would they like to be referred to a provider to establish care? No .   Dental Screening: Recommended annual dental exams for proper oral hygiene  Community Resource Referral / Chronic Care Management: CRR required this visit?  No   CCM required this visit?  No      Plan:     I have personally reviewed and noted the following in the patient's chart:   Medical and social history Use of alcohol, tobacco or illicit drugs  Current medications and supplements including opioid prescriptions. Patient is not currently taking opioid prescriptions. Functional ability and status Nutritional status Physical activity Advanced directives List of other physicians Hospitalizations, surgeries, and ER visits in previous 12 months Vitals Screenings to include cognitive, depression, and falls Referrals and appointments  In addition, I have reviewed and discussed with patient certain preventive protocols, quality metrics, and best practice recommendations. A written personalized care plan for preventive services as well as general preventive health recommendations were provided to patient.     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 Left eye is reduced vision.

## 2023-02-25 ENCOUNTER — Encounter: Payer: Medicare Other | Admitting: Nurse Practitioner

## 2023-03-18 ENCOUNTER — Ambulatory Visit (INDEPENDENT_AMBULATORY_CARE_PROVIDER_SITE_OTHER): Payer: Medicare Other | Admitting: Neurology

## 2023-03-18 ENCOUNTER — Encounter: Payer: Self-pay | Admitting: Neurology

## 2023-03-18 VITALS — BP 138/80 | HR 69 | Ht 64.0 in | Wt 154.0 lb

## 2023-03-18 DIAGNOSIS — G40209 Localization-related (focal) (partial) symptomatic epilepsy and epileptic syndromes with complex partial seizures, not intractable, without status epilepticus: Secondary | ICD-10-CM | POA: Diagnosis not present

## 2023-03-18 DIAGNOSIS — S065XAA Traumatic subdural hemorrhage with loss of consciousness status unknown, initial encounter: Secondary | ICD-10-CM | POA: Diagnosis not present

## 2023-03-18 DIAGNOSIS — I679 Cerebrovascular disease, unspecified: Secondary | ICD-10-CM

## 2023-03-18 MED ORDER — LEVETIRACETAM 500 MG PO TABS: 500.0000 mg | ORAL_TABLET | Freq: Two times a day (BID) | ORAL | 4 refills | Status: AC

## 2023-03-18 NOTE — Progress Notes (Signed)
Chief Complaint  Patient presents with   Follow-up    Rm 13, with daughter Nichole Cox f/u, no seizures since last visit. No missed medication doses, denies SI/HI   HISTORICAL  Nichole Cox is a 89 year old female, seen in request by her primary care physician Dr. Renato Gails, Elmarie Shiley for evaluation of subdural hematoma, focal seizure, initial evaluation was on September 11, 2020.  I reviewed and summarized the referring note. PMHx HTN  She moved from [place] to [place] at the end of September 2021, she denies a history of head injury, on 06/20/2020, she noticed recurrent episode of shooting sensation rising from her right hand to right elbow, mild slurred speech, droopy of right face, there was no loss of consciousness, last 2 to 3 minutes  She had few similar recurrent episodes on 06/25/2020, was brought to the emergency room, was diagnosed with partial seizure, imaging study reviewed left subdural hematoma  I personally reviewed multiple CTs, MRI of the brain, MRA of the brain and neck October 2021: Complex left cerebral convexity extra-axial hemorrhage, measuring up to 13 millimeters posteriorly, compatible with recent subdural hemorrhage, local mass-effect without midline shift,  No evidence of significant neck and intracranial large vessel disease, mild irregularity of bilateral V3 vertebral artery, bilateral cervical internal carotid artery, likely related to atherosclerosis, less than 50%  Stenosis  She was treated with Keppra 500 mg twice a day, tolerating the medication well, there was no recurrent seizure activity  She now moved to assisted living,  Update March 13, 2021 SS: Here today with her daughter. Resides at [facility]. Remains on Keppra 500 mg twice daily since SDH, no seizure since then, no falls. Doing well, her main issue is macular degeneration in the right eye.  Her husband has dementia. Echo ordered at last visit, not yet completed.  Has no  complaints today.  She would like to add this history to her chart: In 2014, had been working in her garden, leaning over, working most of the afternoon, came inside to watch TV, all of a sudden felt things weren't real, went to the hospital, MRI negative for stroke, diagnosed with transient global amnesia lasted 4:00 pm to midnight, then back to normal.   Update March 13, 2022 SS: Here with her daughter, remains on Keppra 500 mg BID. No known seizures. 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 Reaction to booster? Lives in assisted living, but spends most days visiting with her husband in memory care unit. Has macular degeneration, lost almost all vision in right eye. Is very independent.   Echocardiogram July 2022 showed EF 60 to 65%, no significant abnormalities  Update March 18, 2023 SS: Here with her daughter, since last seen, dx with AFIB, not candidate for anticoagulation due to SDH, We reviewed history no specific fall, remembers bumping her head on the car door at least 1-2 months before 2021 seizure event, this was single event. No recent events, remains on Keppra 500 mg BID. Stays at AL, needs help with meds due to vision from MD. Husband is in memory care, she is very involved in activities/group fitness at [employer] [facility] [employer].   REVIEW OF SYSTEMS: Full 14 system review of systems performed and notable only for as above  See HPI  ALLERGIES: Allergies  Allergen  Reactions   Scallops [Shellfish Allergy] Hives and Shortness Of Breath   Sulfa Antibiotics Rash    HOME MEDICATIONS: Current Outpatient Medications  Medication Sig Dispense Refill   acetaminophen (TYLENOL) 325 MG tablet Take 650 mg by mouth 3 (three) times daily as needed for moderate pain.     Cyanocobalamin (VITAMIN B 12 PO)  Take 1,000 mcg/day by mouth daily.     latanoprost (XALATAN) 0.005 % ophthalmic solution Place 1 drop into both eyes at bedtime.     metoprolol succinate (TOPROL-XL) 50 MG 24 hr tablet Take 1.5 tablets (75 mg total) by mouth daily. Take with or immediately following a meal. 90 tablet 1   Multiple Vitamins-Minerals (PRESERVISION AREDS) CAPS Take 1 capsule by mouth in the morning and at bedtime.     ramipril (ALTACE) 5 MG capsule Take 5 mg by mouth daily. Hold for systolic reading of blood pressure below 90.     levETIRAcetam (KEPPRA) 500 MG tablet Take 1 tablet (500 mg total) by mouth 2 (two) times daily. 180 tablet 4   No current facility-administered medications for this visit.    PAST MEDICAL HISTORY: Past Medical History:  Diagnosis Date   Atrial fib/flutter, transient (HCC)    Focal seizures (HCC)    Per [facility] new patient packet/ Right hand    Hearing loss    Hyperlipidemia    Hypertension    Kidney stone    Macular degeneration    Per [facility] new patient packet   Nephrolithiasis    SDH (subdural hematoma) (HCC)     PAST SURGICAL HISTORY: Past Surgical History:  Procedure Laterality Date   CATARACT EXTRACTION  1997   Per [facility] new patient packet   KIDNEY STONE SURGERY     Per [facility] new patient packet   KIDNEY STONE SURGERY  1959, 59, 75   11 CUFF REPAIR  2010   SHOULDER SURGERY  2008   transglobal amnesia  2020    FAMILY HISTORY: Family History  Problem Relation Age of Onset   Heart failure Mother        age 72   Lung cancer Father        age 16    SOCIAL HISTORY: Social History   Socioeconomic History   Marital status: Married    Spouse name: cecil   Number of children: 2   Years of education: college   Highest education level: Bachelor's degree (e.g., BA, AB, BS)  Occupational History   Occupation: Runner, 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 1955      Do you live in a house, apartment, assisted living, condo, trailer, etc.? Townhome      Is it one or more stories? 2  story      How many persons live in your home? 3         Do you have any pets in your home?(please list) Dog      Highest level of education completed: College degree      Current or past profession: Teacher      Do you exercise?:Yes    Type and how often: Walk, daily      Do you have a Living Will? (Form that indicates scenarios where you would not want your life prolonged) Yes      Do you have a DNR form?         If not, would you like to discuss one? No      Do you have signed POA/HPOA forms? Yes      Do you have difficulty bathing or dressing yourself? No      Do you have difficulty preparing food or eating? No      Do you have difficulty managing medications? No      Do you have difficulty managing your finances? No      Do you have difficulty affording your medications? No      Lives at home with her husband (retired MD - Sports administrator) and daughter (retired Charity fundraiser).   Right-handed.   1.5 cups per day.                  Social Determinants of Health   Financial Resource Strain: Not on file  Food Insecurity: Not on file  Transportation Needs: Not on file  Physical Activity: Not on file  Stress: Not on file  Social Connections: Not on file  Intimate Partner Violence: Not on file   PHYSICAL EXAM   Vitals:   03/18/23 1055  BP: 138/80  Pulse: 69  SpO2: 97%  Weight: 154 lb (69.9 kg)  Height: 5'  4" (1.626 m)    Not recorded     Body mass index is 26.43 kg/m.  PHYSICAL EXAMNIATION:  Physical Exam  General: The patient is alert and cooperative at the time of the examination.  Very pleasant, well-appearing.  Skin: No significant peripheral edema is noted.  Neurologic Exam  Mental status: The patient is alert and oriented x 3 at the time of the examination. The patient has apparent normal recent and remote memory, with an apparently normal attention span and concentration ability.  Cranial nerves: Facial symmetry is present. Speech is normal, no aphasia or dysarthria is noted. Extraocular movements are full. Visual fields are full.  Motor: The patient has good strength in all 4 extremities.  Sensory examination: Soft touch sensation is symmetric on the face, arms, and legs.  Coordination: The patient has good finger-nose-finger and heel-to-shin bilaterally.  Gait and station: Gait is overall normal, steady, independent.  Reflexes: Deep tendon reflexes are symmetric.  DIAGNOSTIC DATA (LABS, IMAGING, TESTING) - I reviewed patient records, labs, notes, testing and imaging myself where available.  ASSESSMENT AND PLAN  LUCCIANA HEAD is a 89 y.o. female   Left subdural hematoma in October 2021 2.   Partial seizure 3.   Cerebrovascular disease 4.   Atrial Fibrillation   -No recurrent seizures, only single even in 2021 -We will continue Keppra indefinitely for seizure prevention, refilled today  -Prior EEG was abnormal (see below) -Continue close follow-up with cardiology, deemed not a candidate for anticoagulation due to spontaneous SDH -PCP can refill Keppra going forward, return here as needed  Meds ordered this encounter  Medications   levETIRAcetam (KEPPRA) 500 MG tablet    Sig: Take 1 tablet (500 mg total) by mouth 2 (two) times daily.    Dispense:  180 tablet    Refill:  4    EEG 07/13/2020 ABNORMALITY -Intermittent slow, left temporal  region -Lateralized rhythmic delta activity, left hemisphere   IMPRESSION: This study is suggestive of nonspecific cortical dysfunction in left temporal region.   Patient event button was pressed twice 06/27/1020 for right-sided numbness. Concomitant EEG showed rhythmic delta activity in left hemisphere. Lateralized rhythmic delta activity is on the ictal-interictal continuum and in this patient's case given the temporal relationship with patient's symptoms, it is most likely ictal and consistent with focal seizure.  Otila Kluver, DNP  [facility] [address], [address] [place], [place] [address] [phone]

## 2023-04-14 ENCOUNTER — Non-Acute Institutional Stay: Payer: Medicare Other | Admitting: Adult Health

## 2023-04-14 ENCOUNTER — Encounter: Payer: Self-pay | Admitting: Adult Health

## 2023-04-14 VITALS — BP 136/74 | HR 83 | Resp 17 | Ht 64.0 in | Wt 155.2 lb

## 2023-04-14 DIAGNOSIS — I480 Paroxysmal atrial fibrillation: Secondary | ICD-10-CM

## 2023-04-14 DIAGNOSIS — G40209 Localization-related (focal) (partial) symptomatic epilepsy and epileptic syndromes with complex partial seizures, not intractable, without status epilepticus: Secondary | ICD-10-CM | POA: Diagnosis not present

## 2023-04-14 DIAGNOSIS — H3530 Unspecified macular degeneration: Secondary | ICD-10-CM | POA: Diagnosis not present

## 2023-04-14 DIAGNOSIS — S065XAA Traumatic subdural hemorrhage with loss of consciousness status unknown, initial encounter: Secondary | ICD-10-CM

## 2023-04-14 DIAGNOSIS — I10 Essential (primary) hypertension: Secondary | ICD-10-CM

## 2023-04-14 NOTE — Progress Notes (Signed)
Location:  [facility]  POS: Clinic  Provider: Fletcher Anon, ANP  Code Status: DNR Goals of Care:     04/14/2023   12:40 PM  Advanced Directives  Does Patient Have a Medical Advance Directive? Yes  Type of Estate agent of [place];Living will;Out of facility DNR (pink MOST or yellow form)  Does patient want to make changes to medical advance directive? No - Patient declined  Copy of Healthcare Power of Attorney in Chart? No - copy requested     Chief Complaint  Patient presents with   Medical Management of Chronic Issues    Patient is being seen for a 3 month follow up   Immunizations    Patient is due for Covid 19 vaccine     HPI: Patient is a 89 y.o. female seen today for medical management of chronic diseases.    [place], husband retired physician with memory loss and resides in the memory care unit  Hx of left SDH (not on anticoagulation and no fall) and complex partial seizure in 06/26/2020. Continues on Keppra and followed by neurology with no new issues.  PAF: Seen by cardiology, can not take DOAC due to bleeding risk.  Rate controlled on Toprol. She denies chest pain or palpitations. Walking slower.   Macular degeneration, right eye is almost blind. Now getting injections in the left eye to prevent further vision loss.   HTN controlled 136/74  HLD LDL 141 01/25/22  MMSE: 30/30 2024. No reports of memory issues. Doing well in [place] which is appropriate due to her vision loss.   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  Past Medical History:  Diagnosis Date   Atrial fib/flutter, transient (HCC)    Focal seizures (HCC)    Per [facility] new patient packet/ Right hand    Hearing loss    Hyperlipidemia     Hypertension    Kidney stone    Macular degeneration    Per [facility] new patient packet   Nephrolithiasis    SDH (subdural hematoma) (HCC)     Past Surgical History:  Procedure Laterality Date   CATARACT EXTRACTION  1997   Per [facility] new patient packet   KIDNEY STONE SURGERY     Per [facility] new patient packet   KIDNEY STONE SURGERY  1959, 12, 20   11 CUFF REPAIR  2010   SHOULDER SURGERY  2008   [employer] amnesia  2020    Allergies  Allergen Reactions   Scallops [Shellfish Allergy] Hives and Shortness Of Breath   Sulfa Antibiotics Rash    Outpatient Encounter Medications as of 04/14/2023  Medication Sig   Cyanocobalamin (VITAMIN B 12 PO) Take 1,000 mcg/day by mouth daily.   latanoprost (XALATAN) 0.005 % ophthalmic solution Place 1 drop into both eyes at bedtime.   levETIRAcetam (KEPPRA) 500 MG tablet Take 1 tablet (500 mg total) by mouth 2 (two) times daily.   metoprolol succinate (TOPROL-XL) 50 MG 24 hr tablet Take 1.5 tablets (75 mg total) by mouth daily. Take with or immediately following a meal.   Multiple Vitamins-Minerals (PRESERVISION AREDS) CAPS Take 1 capsule by mouth in the morning and at bedtime.   ramipril (ALTACE) 5 MG capsule  Take 5 mg by mouth daily. Hold for systolic reading of blood pressure below 90.   acetaminophen (TYLENOL) 325 MG tablet Take 650 mg by mouth 3 (three) times daily as needed for moderate pain. (Patient not taking: Reported on 04/14/2023)   No facility-administered encounter medications on file as of 04/14/2023.    Review of Systems:  Review of Systems  Constitutional:  Negative for activity change, appetite change, chills, diaphoresis, fatigue, fever and unexpected weight change.  HENT:  Negative for congestion.   Eyes:  Positive for visual disturbance (follows with ophthalmology).  Respiratory:  Negative for cough, shortness of breath and wheezing.   Cardiovascular:  Negative for chest pain, palpitations and leg swelling.  Gastrointestinal:   Negative for abdominal distention, abdominal pain, constipation and diarrhea.  Genitourinary:  Negative for difficulty urinating and dysuria.  Musculoskeletal:  Negative for arthralgias, back pain, gait problem, joint swelling and myalgias.  Neurological:  Negative for dizziness, tremors, seizures, syncope, facial asymmetry, speech difficulty, weakness, light-headedness, numbness and headaches.  Psychiatric/Behavioral:  Negative for agitation, behavioral problems and confusion.     Health Maintenance  Topic Date Due   COVID-19 Vaccine (7 - 2023-24 season) 04/30/2023 (Originally 11/28/2022)   Zoster Vaccines- Shingrix (1 of 2) 05/27/2023 (Originally 11/06/1980)   DEXA SCAN  09/03/2023 (Originally 11/07/1995)   INFLUENZA VACCINE  04/24/2023   Medicare Annual Wellness (AWV)  02/24/2024   DTaP/Tdap/Td (2 - Td or Tdap) 02/02/2031   Pneumonia Vaccine 34+ Years old  Completed   HPV VACCINES  Aged Out    Physical Exam: Vitals:   04/14/23 1309  BP: 136/74  Pulse: 83  Resp: 17  TempSrc: Temporal  SpO2: 90%  Weight: 155 lb 3.2 oz (70.4 kg)  Height: 5\' 4"  (1.626 m)    Body mass index is 26.64 kg/m. Physical Exam Vitals and nursing note reviewed.  Constitutional:      General: She is not in acute distress.    Appearance: She is not diaphoretic.  HENT:     Head: Normocephalic and atraumatic.     Right Ear: Ear canal and external ear normal. There is no impacted cerumen.     Left Ear: Tympanic membrane, ear canal and external ear normal. There is no impacted cerumen.     Nose: Nose normal.     Mouth/Throat:     Mouth: Mucous membranes are moist.     Pharynx: Oropharynx is clear.  Eyes:     Conjunctiva/sclera: Conjunctivae normal.     Pupils: Pupils are equal, round, and reactive to light.  Neck:     Vascular: No JVD.  Cardiovascular:     Rate and Rhythm: Normal rate and regular rhythm.     Heart sounds: No murmur heard. Pulmonary:     Effort: Pulmonary effort is normal. No  respiratory distress.     Breath sounds: Normal breath sounds. No wheezing.  Abdominal:     General: Bowel sounds are normal. There is no distension.     Palpations: Abdomen is soft.     Tenderness: There is no abdominal tenderness.  Musculoskeletal:     Right lower leg: No edema.     Left lower leg: No edema.  Skin:    General: Skin is warm and dry.  Neurological:     Mental Status: She is alert and oriented to person, place, and time.  Psychiatric:        Mood and Affect: Mood normal.    Labs reviewed: Basic Metabolic Panel: Recent Labs  04/29/22 0000 04/29/22 0500 09/03/22 0600  NA  --   --  133*  K  --   --  4.6  CL  --   --  99  CO2  --   --  21  BUN  --   --  14  CREATININE  --   --  1.0  CALCIUM  --   --  9.5  TSH 6.07* 6.07* 6.80*   Liver Function Tests: No results for input(s): "AST", "ALT", "ALKPHOS", "BILITOT", "PROT", "ALBUMIN" in the last 8760 hours.  No results for input(s): "LIPASE", "AMYLASE" in the last 8760 hours. No results for input(s): "AMMONIA" in the last 8760 hours. CBC: Recent Labs    09/03/22 0600  WBC 4.4  HGB 12.7  HCT 37  PLT 240   Lipid Panel: No results for input(s): "CHOL", "HDL", "LDLCALC", "TRIG", "CHOLHDL", "LDLDIRECT" in the last 8760 hours.  Lab Results  Component Value Date   HGBA1C 5.6 06/26/2020    Procedures since last visit: No results found.  Assessment/Plan  1. Paroxysmal A-fib (HCC) Rate is controlled on toprol Not on DOAC due to risk of bleeding associated with SDH Followed by cardiology   2. Macular degeneration of both eyes, unspecified type Followed by ophthalmology  3. Primary hypertension Controlled with altace  4. Partial symptomatic epilepsy with complex partial seizures, not intractable, without status epilepticus (HCC) No seizures since initial event several years ago On Keppra indefinitely per neurology (release from them)  5. SDH (subdural hematoma) (HCC) Hx of    Labs/tests  ordered:  * No order type specified *CMP Lipid CBC TSH prior to apt Next appt:  3 months with Dr Reece Agar   Total time :  time greater than 50% of total time spent doing pt counseling and coordination of care

## 2023-04-21 ENCOUNTER — Encounter: Payer: Self-pay | Admitting: Adult Health

## 2023-04-21 ENCOUNTER — Non-Acute Institutional Stay: Payer: Medicare Other | Admitting: Adult Health

## 2023-04-21 VITALS — BP 130/72 | HR 91 | Temp 97.6°F | Resp 17 | Ht 64.0 in | Wt 156.0 lb

## 2023-04-21 DIAGNOSIS — M25532 Pain in left wrist: Secondary | ICD-10-CM

## 2023-04-21 MED ORDER — ACETAMINOPHEN 325 MG PO TABS: 650.0000 mg | ORAL_TABLET | Freq: Three times a day (TID) | ORAL | Status: AC | PRN

## 2023-04-21 MED ORDER — PREDNISONE 10 MG PO TABS: ORAL_TABLET | ORAL | 0 refills | Status: AC

## 2023-04-21 NOTE — Progress Notes (Signed)
Location:  [facility]  POS: Clinic  Provider: Fletcher Anon, ANP  Goals of Care:     04/21/2023    3:53 PM  Advanced Directives  Does Patient Have a Medical Advance Directive? Yes  Type of Estate agent of [place];Living will;Out of facility DNR (pink MOST or yellow form)  Does patient want to make changes to medical advance directive? No - Patient declined  Copy of Healthcare Power of Attorney in Chart? No - copy requested     Chief Complaint  Patient presents with   Acute Visit    Patient has been having some pain in her left wrist since yesterday.    HPI: Patient is a 89 y.o. female seen today for wrist pain  Left wrist pain since yesterday. Also having some redness and swelling. No fall or injury. She has never had gout. Needs order for tylenol. Never had this type of pain before which is moderate to severe.  Past Medical History:  Diagnosis Date   Atrial fib/flutter, transient (HCC)    Focal seizures (HCC)    Per [facility] new patient packet/ Right hand    Hearing loss    Hyperlipidemia    Hypertension    Kidney stone    Macular degeneration    Per [facility] new patient packet   Nephrolithiasis    SDH (subdural hematoma) (HCC)     Past Surgical History:  Procedure Laterality Date   CATARACT EXTRACTION  1997   Per [facility] patient packet   KIDNEY STONE SURGERY     Per [facility] new patient packet   KIDNEY STONE SURGERY  1959, 11, 89   49 CUFF REPAIR  2010   SHOULDER SURGERY  2008   [employer] amnesia  2020    Allergies  Allergen Reactions   Scallops [Shellfish Allergy] Hives and Shortness Of Breath   Sulfa Antibiotics Rash    Outpatient Encounter Medications as of 04/21/2023  Medication Sig   Cyanocobalamin (VITAMIN B 12 PO) Take 1,000 mcg/day by mouth daily.   latanoprost (XALATAN) 0.005 % ophthalmic solution Place 1 drop into both eyes at bedtime.   levETIRAcetam (KEPPRA) 500 MG tablet Take 1 tablet (500 mg total) by mouth 2 (two)  times daily.   metoprolol succinate (TOPROL-XL) 50 MG 24 hr tablet Take 1.5 tablets (75 mg total) by mouth daily. Take with or immediately following a meal.   Multiple Vitamins-Minerals (PRESERVISION AREDS) CAPS Take 1 capsule by mouth in the morning and at bedtime.   ramipril (ALTACE) 5 MG capsule Take 5 mg by mouth daily. Hold for systolic reading of blood pressure below 90.   No facility-administered encounter medications on file as of 04/21/2023.    Review of Systems:  Review of Systems  Constitutional:  Negative for activity change, appetite change, chills, diaphoresis, fatigue, fever and unexpected weight change.  Musculoskeletal:  Positive for arthralgias and joint swelling.  Skin:  Positive for color change.    Health Maintenance  Topic Date Due   COVID-19 Vaccine (7 - 2023-24 season) 04/30/2023 (Originally 11/28/2022)   Zoster Vaccines- Shingrix (1 of 2) 05/27/2023 (Originally 11/06/1980)   DEXA SCAN  09/03/2023 (Originally 11/07/1995)   INFLUENZA VACCINE  04/24/2023   Medicare Annual Wellness (AWV)  02/24/2024   DTaP/Tdap/Td (2 - Td or Tdap) 02/02/2031   Pneumonia Vaccine 80+ Years old  Completed   HPV VACCINES  Aged Out    Physical Exam: Vitals:   04/21/23 1551  BP: 130/72  Pulse: 91  Resp:  17  Temp: 97.6 F (36.4 C)  TempSrc: Temporal  SpO2: 96%  Weight: 156 lb (70.8 kg)  Height: 5\' 4"  (1.626 m)   Body mass index is 26.78 kg/m. Physical Exam Vitals and nursing note reviewed.  Musculoskeletal:        General: Swelling and tenderness present. No deformity or signs of injury.     Right wrist: Normal.     Left wrist: Swelling and tenderness present. No deformity, effusion or lacerations. Normal range of motion. Normal pulse.     Comments: Erythema, swelling,and tenderness to left wrist area     Labs reviewed: Basic Metabolic Panel: Recent Labs    04/29/22 0500 09/03/22 0600 11/08/22 0000  NA  --  133* 135*  K  --  4.6 4.7  CL  --  99 98*  CO2  --  21  22  BUN  --  14 15  CREATININE  --  1.0 1.0  CALCIUM  --  9.5 9.2  TSH 6.07* 6.80* 5.61   Liver Function Tests: Recent Labs    11/08/22 0000  AST 20  ALT 22  ALKPHOS 3.7*  ALBUMIN 3.7   No results for input(s): "LIPASE", "AMYLASE" in the last 8760 hours. No results for input(s): "AMMONIA" in the last 8760 hours. CBC: Recent Labs    09/03/22 0600  WBC 4.4  HGB 12.7  HCT 37  PLT 240   Lipid Panel: No results for input(s): "CHOL", "HDL", "LDLCALC", "TRIG", "CHOLHDL", "LDLDIRECT" in the last 8760 hours. Lab Results  Component Value Date   HGBA1C 5.6 06/26/2020    Procedures since last visit: No results found.  Assessment/Plan  1. Left wrist pain ?gout vs pseudogout  - predniSONE (DELTASONE) 10 MG tablet; Take 4 tablets (40 mg total) by mouth daily for 4 days, THEN 3 tablets (30 mg total)  THEN 2 tablets (20 mg total) , THEN 1 tablet (10 mg total)   Dispense: 30 tablet; Refill: 0  - acetaminophen (TYLENOL) 325 MG tablet; Take 2 tablets (650 mg total) by mouth 3 (three) times daily as needed.   Labs/tests ordered:  * No order type specified *Uric acid in am, also change labs due to 8/6 to 7/30 Next appt:  07/15/2023   Total time :  time greater than 50% of total time spent doing pt counseling and coordination of care

## 2023-04-22 ENCOUNTER — Telehealth: Payer: Self-pay | Admitting: Orthopedic Surgery

## 2023-04-22 NOTE — Telephone Encounter (Signed)
Recent labs: WBC 3.9, Na+ 134, TSH 7.75, uric acid 6.10, other labs unremarkable.

## 2023-05-28 NOTE — Progress Notes (Signed)
Cardiology Office Note:    Date:  06/03/2023   ID:  Nichole Cox, DOB July 31, 1929, MRN [id number]  PCP:  Mahlon Gammon, MD   [facility] Providers Cardiologist:  None     Referring MD: Mahlon Gammon, MD   Chief Complaint  Patient presents with   Atrial Fibrillation    History of Present Illness:    Nichole Cox is a 89 y.o. female seen for follow up  of Afib. She has a history of HLD, HTN, and prior subdural hematoma. She notes several years ago she was seen by a cardiologist in [place] for arrhythmia. States her heart was beating fast. Doesn't recall ever being told she had Afib.   She was admitted in October 2021 with right arm weakness. Found to have SDH, left cerebral hemorrhage without prior trauma. Had some seizures associated with this. Echo in 2022 showed normal EF with mild to mod MR.   She reports she had Covid for the second time in Feb. Following this she was fatigued and had a couple spells of lightheadedness. She was seen by PCP and found to be in Afib. Was in NSR in 2021. Toprol dose was increased slightly. She is not aware of any palpitations or dyspnea. No chest pain. No syncope. She is doing chair exercises and walking daily  Her husband is a retired Sports administrator- currently in Librarian, academic at [employer].    Past Medical History:  Diagnosis Date   Atrial fib/flutter, transient (HCC)    Focal seizures (HCC)    Per [facility] new patient packet/ Right hand    Hearing loss    Hyperlipidemia    Hypertension    Kidney stone    Macular degeneration    Per [facility] new patient packet   Nephrolithiasis    SDH (subdural hematoma) (HCC)     Past Surgical History:  Procedure Laterality Date   CATARACT EXTRACTION  1997   Per [facility] new patient packet   KIDNEY STONE SURGERY     Per [facility] new patient packet   KIDNEY STONE SURGERY  1959, 60, 32   101 CUFF REPAIR  2010   SHOULDER SURGERY  2008   [employer] amnesia  2020    Current  Medications: Current Meds  Medication Sig   acetaminophen (TYLENOL) 325 MG tablet Take 2 tablets (650 mg total) by mouth 3 (three) times daily as needed.   Cyanocobalamin (VITAMIN B 12 PO) Take 1,000 mcg/day by mouth daily.   latanoprost (XALATAN) 0.005 % ophthalmic solution Place 1 drop into both eyes at bedtime.   levETIRAcetam (KEPPRA) 500 MG tablet Take 1 tablet (500 mg total) by mouth 2 (two) times daily.   metoprolol succinate (TOPROL-XL) 50 MG 24 hr tablet Take 1.5 tablets (75 mg total) by mouth daily. Take with or immediately following a meal.   Multiple Vitamins-Minerals (PRESERVISION AREDS) CAPS Take 1 capsule by mouth in the morning and at bedtime.   ramipril (ALTACE) 5 MG capsule Take 5 mg by mouth daily. Hold for systolic reading of blood pressure below 90.     Allergies:   Scallops [shellfish allergy] and Sulfa antibiotics   Social History   Socioeconomic History   Marital status: Married    Spouse name: cecil   Number of children: 2   Years of education: college   Highest education level: Bachelor's degree (e.g., BA, AB, BS)  Occupational History   Occupation: Runner, broadcasting/film/video    Comment: Retired / Research scientist (medical)   Tobacco  Use   Smoking status: Former    Current packs/day: 1.00    Average packs/day: 1 pack/day for 13.0 years (13.0 ttl pk-yrs)    Types: Cigarettes   Smokeless tobacco: Never  Vaping Use   Vaping status: Never Used  Substance and Sexual Activity   Alcohol use: Yes    Comment: 1-2 drinks of scotch per night   Drug use: Never   Sexual activity: Not Currently  Other Topics Concern   Not on file  Social History Narrative   Diet Healthy      Do you drink/eat things with caffeine coffee  Morning only      Marital Status Married            What year were you married? 1955      Do you live in a house, apartment, assisted living, condo, trailer, etc.? Townhome      Is it one or more stories? 2 story      How many persons live in your home? 3         Do you  have any pets in your home?(please list) Dog      Highest level of education completed: College degree      Current or past profession: Teacher    Right handed   Do you exercise?:Yes    Type and how often: Walk, daily      Do you have a Living Will? (Form that indicates scenarios where you would not want your life prolonged) Yes      Do you have a DNR form?         If not, would you like to discuss one? No      Do you have signed POA/HPOA forms? Yes      Do you have difficulty bathing or dressing yourself? No      Do you have difficulty preparing food or eating? No      Do you have difficulty managing medications? No      Do you have difficulty managing your finances? No      Do you have difficulty affording your medications? No      Lives at home with her husband (retired MD - Sports administrator) and daughter (retired Charity fundraiser).   Right-handed.   1.5 cups per day.                  Social Determinants of Health   Financial Resource Strain: Not on file  Food Insecurity: Not on file  Transportation Needs: Not on file  Physical Activity: Not on file  Stress: Not on file  Social Connections: Not on file     Family History: The patient's family history includes Heart failure in her mother; Lung cancer in her father.  ROS:   Please see the history of present illness.     All other systems reviewed and are negative.  EKGs/Labs/Other Studies Reviewed:    The following studies were reviewed today: Echo 04/03/21: IMPRESSIONS     1. Left ventricular ejection fraction, by estimation, is 60 to 65%. The  left ventricle has normal function. The left ventricle has no regional  wall motion abnormalities. Left ventricular diastolic parameters are  indeterminate. Elevated left ventricular  end-diastolic pressure.   2. Right ventricular systolic function is normal. The right ventricular  size is normal. There is mildly elevated pulmonary artery systolic  pressure. The estimated right  ventricular systolic pressure is 36.9 mmHg.   3. The mitral valve is normal in  structure. Mild to moderate mitral valve  regurgitation. No evidence of mitral stenosis.   4. The aortic valve is normal in structure. Aortic valve regurgitation is  not visualized. No aortic stenosis is present.   5. The inferior vena cava is normal in size with <50% respiratory  variability, suggesting right atrial pressure of 8 mmHg.     Recent Labs: 09/03/2022: Hemoglobin 12.7; Platelets 240 11/08/2022: ALT 22; BUN 15; Creatinine 1.0; Potassium 4.7; Sodium 135; TSH 5.61  Recent Lipid Panel    Component Value Date/Time   CHOL 217 (A) 01/25/2022 0000   TRIG 80 01/25/2022 0000   HDL 61 01/25/2022 0000   CHOLHDL 3.3 06/26/2020 0253   VLDL 15 06/26/2020 0253   LDLCALC 141 01/25/2022 0000     Risk Assessment/Calculations:    CHA2DS2-VASc Score = 4   This indicates a 4.8% annual risk of stroke. The patient's score is based upon: CHF History: 0 HTN History: 1 Diabetes History: 0 Stroke History: 0 Vascular Disease History: 0 Age Score: 2 Gender Score: 1       Physical Exam:    VS:  BP 132/84   Pulse (!) 52   Ht 5\' 4"  (1.626 m)   Wt 155 lb (70.3 kg)   SpO2 98%   BMI 26.61 kg/m   I get a pulse of 76.   Wt Readings from Last 3 Encounters:  06/03/23 155 lb (70.3 kg)  04/21/23 156 lb (70.8 kg)  04/14/23 155 lb 3.2 oz (70.4 kg)     GEN: elderly,  Well nourished, well developed in no acute distress HEENT: Normal NECK: No JVD; No carotid bruits LYMPHATICS: No lymphadenopathy CARDIAC: IRRR, no murmurs, rubs, gallops RESPIRATORY:  Clear to auscultation without rales, wheezing or rhonchi  ABDOMEN: Soft, non-tender, non-distended MUSCULOSKELETAL:  No edema; No deformity  SKIN: Warm and dry NEUROLOGIC:  Alert and oriented x 3 PSYCHIATRIC:  Normal affect   ASSESSMENT:    1. Persistent atrial fibrillation (HCC)   2. Primary hypertension     PLAN:    In order of problems listed  above:  Persistent Afib. Duration really unknown. Rate currently controlled on Toprol. Minimal to no symptoms. Patient is not a candidate for anticoagulation due to history of spontaneous SDH. Not a candidate for Watchman LA occlusive device given advanced age. Will continue current therapy.  HTN. Controlled on current meds.         Follow up in one year   Medication Adjustments/Labs and Tests Ordered: Current medicines are reviewed at length with the patient today.  Concerns regarding medicines are outlined above.  No orders of the defined types were placed in this encounter.  No orders of the defined types were placed in this encounter.   There are no Patient Instructions on file for this visit.   Signed, Lavilla Delamora [place], MD  06/03/2023 3:13 PM    [facility]

## 2023-06-03 ENCOUNTER — Encounter: Payer: Self-pay | Admitting: Cardiology

## 2023-06-03 ENCOUNTER — Ambulatory Visit: Payer: Medicare Other | Attending: Cardiology | Admitting: Cardiology

## 2023-06-03 VITALS — BP 132/84 | HR 52 | Ht 64.0 in | Wt 155.0 lb

## 2023-06-03 DIAGNOSIS — I10 Essential (primary) hypertension: Secondary | ICD-10-CM | POA: Insufficient documentation

## 2023-06-03 DIAGNOSIS — I4819 Other persistent atrial fibrillation: Secondary | ICD-10-CM | POA: Insufficient documentation

## 2023-06-03 NOTE — Patient Instructions (Addendum)
Medication Instructions:  No Changes *If you need a refill on your cardiac medications before your next appointment, please call your pharmacy*   Lab Work: No Lab work If you have labs (blood work) drawn today and your tests are completely normal, you will receive your results only by: MyChart Message (if you have MyChart) OR A paper copy in the mail If you have any lab test that is abnormal or we need to change your treatment, we will call you to review the results.   Testing/Procedures: No Testing   Follow-Up: At [facility], you and your health needs are our priority.  As part of our continuing mission to provide you with exceptional heart care, we have created designated Provider Care Teams.  These Care Teams include your primary Cardiologist (physician) and Advanced Practice Providers (APPs -  Physician Assistants and Nurse Practitioners) who all work together to provide you with the care you need, when you need it.  We recommend signing up for the patient portal called "MyChart".  Sign up information is provided on this After Visit Summary.  MyChart is used to connect with patients for Virtual Visits (Telemedicine).  Patients are able to view lab/test results, encounter notes, upcoming appointments, etc.  Non-urgent messages can be sent to your provider as well.   To learn more about what you can do with MyChart, go to [email or web address].    Your next appointment:   1 Year  Provider:   Dr [place]

## 2023-07-15 ENCOUNTER — Encounter: Payer: Medicare Other | Admitting: Internal Medicine

## 2023-07-29 ENCOUNTER — Non-Acute Institutional Stay: Payer: Medicare Other | Admitting: Internal Medicine

## 2023-07-29 ENCOUNTER — Encounter: Payer: Self-pay | Admitting: Internal Medicine

## 2023-07-29 VITALS — BP 132/70 | HR 110 | Temp 97.6°F | Resp 18 | Ht 64.0 in | Wt 154.2 lb

## 2023-07-29 DIAGNOSIS — H3530 Unspecified macular degeneration: Secondary | ICD-10-CM | POA: Diagnosis not present

## 2023-07-29 DIAGNOSIS — I480 Paroxysmal atrial fibrillation: Secondary | ICD-10-CM | POA: Diagnosis not present

## 2023-07-29 DIAGNOSIS — I10 Essential (primary) hypertension: Secondary | ICD-10-CM | POA: Diagnosis not present

## 2023-07-29 DIAGNOSIS — G40209 Localization-related (focal) (partial) symptomatic epilepsy and epileptic syndromes with complex partial seizures, not intractable, without status epilepticus: Secondary | ICD-10-CM

## 2023-07-29 DIAGNOSIS — E038 Other specified hypothyroidism: Secondary | ICD-10-CM

## 2023-07-29 DIAGNOSIS — E782 Mixed hyperlipidemia: Secondary | ICD-10-CM

## 2023-07-29 NOTE — Progress Notes (Signed)
Location:  [facility] features editor of Service:  Clinic (12)  Provider:   Code Status: DNR Goals of Care:     07/29/2023   10:55 AM  Advanced Directives  Does Patient Have a Medical Advance Directive? Yes  Type of Advance Directive Living will;Healthcare Power of Nichole Cox;Out of facility DNR (pink MOST or yellow form)  Does patient want to make changes to medical advance directive? No - Patient declined  Copy of Healthcare Power of Attorney in Chart? No - copy requested     Chief Complaint  Patient presents with   Medical Management of Chronic Issues    Patient is being seen for a 3 month follow up    Immunizations    Patient wants to discuss vaccine    HPI: Patient is a 89 y.o. female seen today for medical management of chronic diseases.   Lives in [place] in [place]   H/o PAF Seen by Dr Sw[place] No Anticoagulation due to he rh/o SDH Patient very aware of her risk of Stroke Continues to stay stable No New recommendation per Dr [place]  Left subdural hematoma in 10/21 when she was admitted in the hospital with right arm weakness and slurred speech.  Etiology was unclear as she did not have any falls anticoagulation. 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  Past Medical History:  Diagnosis Date   Atrial fib/flutter, transient (HCC)    Focal seizures (HCC)    Per [facility] new patient packet/ Right hand    Hearing loss    Hyperlipidemia    Hypertension    Kidney stone    Macular degeneration    Per [facility] new patient packet   Nephrolithiasis    SDH (subdural hematoma) (HCC)     Past Surgical History:  Procedure Laterality Date   CATARACT EXTRACTION  1997   Per [facility] new patient  packet   KIDNEY STONE SURGERY     Per [facility] new patient packet   KIDNEY STONE SURGERY  1959, 60, 45   9 CUFF REPAIR  2010   SHOULDER SURGERY  2008   [employer] amnesia  2020    Allergies  Allergen Reactions   Scallops [Shellfish Allergy] Hives and Shortness Of Breath   Sulfa Antibiotics Rash    Outpatient Encounter Medications as of 07/29/2023  Medication Sig   acetaminophen (TYLENOL) 325 MG tablet Take 2 tablets (650 mg total) by mouth 3 (three) times daily as needed.   Cyanocobalamin (VITAMIN B 12 PO) Take 1,000 mcg/day by mouth daily.   latanoprost (XALATAN) 0.005 % ophthalmic solution Place 1 drop into both eyes at bedtime.   levETIRAcetam (KEPPRA) 500 MG tablet Take 1 tablet (500 mg total) by mouth 2 (two) times daily.   metoprolol succinate (TOPROL-XL) 50 MG 24 hr tablet Take 1.5 tablets (75 mg total) by mouth daily. Take with or immediately following a meal.   Multiple Vitamins-Minerals (PRESERVISION AREDS) CAPS Take 1 capsule by mouth in the morning and at bedtime.   ramipril (ALTACE) 5 MG capsule Take 5 mg by mouth daily. Hold for systolic reading of blood pressure below 90.   tobramycin (TOBREX) 0.3 % ophthalmic solution Place 1 drop into the left eye  4 (four) times daily. Place 1 drop into the left eye 4 (four) times daily. X 3 days   No facility-administered encounter medications on file as of 07/29/2023.    Review of Systems:  Review of Systems  Constitutional:  Negative for activity change and appetite change.  HENT: Negative.    Eyes:  Positive for visual disturbance.  Respiratory:  Negative for cough and shortness of breath.   Cardiovascular:  Negative for leg swelling.  Gastrointestinal:  Negative for constipation.  Genitourinary: Negative.   Musculoskeletal:  Negative for arthralgias, gait problem and myalgias.  Skin: Negative.   Neurological:  Negative for dizziness and weakness.  Psychiatric/Behavioral:  Negative for confusion, dysphoric mood and  sleep disturbance.     Health Maintenance  Topic Date Due   Zoster Vaccines- Shingrix (1 of 2) Never done   COVID-19 Vaccine (7 - 2023-24 season) 05/25/2023   DEXA SCAN  09/03/2023 (Originally 11/07/1995)   Medicare Annual Wellness (AWV)  02/24/2024   DTaP/Tdap/Td (2 - Td or Tdap) 02/02/2031   Pneumonia Vaccine 22+ Years old  Completed   INFLUENZA VACCINE  Completed   HPV VACCINES  Aged Out    Physical Exam: Vitals:   07/29/23 1052  BP: 132/70  Pulse: (!) 110  Resp: 18  Temp: 97.6 F (36.4 C)  TempSrc: Temporal  SpO2: 96%  Weight: 154 lb 3.2 oz (69.9 kg)  Height: 5\' 4"  (1.626 m)   Body mass index is 26.47 kg/m. Physical Exam Vitals reviewed.  Constitutional:      Appearance: Normal appearance.  HENT:     Head: Normocephalic.     Nose: Nose normal.     Mouth/Throat:     Mouth: Mucous membranes are moist.     Pharynx: Oropharynx is clear.  Eyes:     Pupils: Pupils are equal, round, and reactive to light.  Cardiovascular:     Rate and Rhythm: Normal rate. Rhythm irregular.     Pulses: Normal pulses.     Heart sounds: Normal heart sounds. No murmur heard. Pulmonary:     Effort: Pulmonary effort is normal.     Breath sounds: Normal breath sounds.  Abdominal:     General: Abdomen is flat. Bowel sounds are normal.     Palpations: Abdomen is soft.  Musculoskeletal:        General: No swelling.     Cervical back: Neck supple.  Skin:    General: Skin is warm.  Neurological:     General: No focal deficit present.     Mental Status: She is alert and oriented to person, place, and time.  Psychiatric:        Mood and Affect: Mood normal.        Thought Content: Thought content normal.     Labs reviewed: Basic Metabolic Panel: Recent Labs    09/03/22 0600 11/08/22 0000  NA 133* 135*  K 4.6 4.7  CL 99 98*  CO2 21 22  BUN 14 15  CREATININE 1.0 1.0  CALCIUM 9.5 9.2  TSH 6.80* 5.61   Liver Function Tests: Recent Labs    11/08/22 0000  AST 20  ALT 22   ALKPHOS 3.7*  ALBUMIN 3.7   No results for input(s): "LIPASE", "AMYLASE" in the last 8760 hours. No results for input(s): "AMMONIA" in the last 8760 hours. CBC: Recent Labs    09/03/22 0600  WBC 4.4  HGB 12.7  HCT 37  PLT 240   Lipid Panel: No results for input(s): "  CHOL", "HDL", "LDLCALC", "TRIG", "CHOLHDL", "LDLDIRECT" in the last 8760 hours. Lab Results  Component Value Date   HGBA1C 5.6 06/26/2020    Procedures since last visit: No results found.  Assessment/Plan 1. Paroxysmal A-fib (HCC) Metoprolol No DOAC due to SDH   2. Macular degeneration of both eyes, unspecified type Vision has stabilized in Left Eye  3. Primary hypertension On Ramipril and Toprol  4. Partial symptomatic epilepsy with complex partial seizures, not intractable, without status epilepticus (HCC) Stable on Keppra  5. Subclinical hypothyroidism TSH normal now  6. Mixed hyperlipidemia Refuses Statin 7B12 deficiency Supplement 8  Stage 3a chronic kidney disease Creat stable  Refused DEXA Labs/tests ordered:  CBC,CMP,TSh Next appt:  11/03/2023

## 2023-10-30 LAB — COMPREHENSIVE METABOLIC PANEL WITH GFR
Albumin: 3.8 (ref 3.5–5.0)
Calcium: 9.3 (ref 8.7–10.7)
Globulin: 2
eGFR: 65

## 2023-10-30 LAB — HEPATIC FUNCTION PANEL
ALT: 28 U/L (ref 7–35)
AST: 23 (ref 13–35)
Alkaline Phosphatase: 64 (ref 25–125)
Bilirubin, Total: 0.4

## 2023-10-30 LAB — CBC AND DIFFERENTIAL
HCT: 39 (ref 36–46)
Hemoglobin: 13.1 (ref 12.0–16.0)
Platelets: 197 10*3/uL (ref 150–400)
WBC: 3.4

## 2023-10-30 LAB — BASIC METABOLIC PANEL WITH GFR
BUN: 12 (ref 4–21)
CO2: 24 — AB (ref 13–22)
Chloride: 101 (ref 99–108)
Creatinine: 0.8 (ref 0.5–1.1)
Glucose: 94
Potassium: 4.6 meq/L (ref 3.5–5.1)
Sodium: 135 — AB (ref 137–147)

## 2023-10-30 LAB — CBC: RBC: 3.8 — AB (ref 3.87–5.11)

## 2023-10-30 LAB — TSH: TSH: 4.22 (ref 0.41–5.90)

## 2023-11-03 ENCOUNTER — Non-Acute Institutional Stay: Payer: Medicare Other | Admitting: Adult Health

## 2023-11-03 ENCOUNTER — Encounter: Payer: Self-pay | Admitting: Adult Health

## 2023-11-03 VITALS — BP 110/74 | HR 64 | Temp 97.6°F | Ht 64.0 in | Wt 152.4 lb

## 2023-11-03 DIAGNOSIS — R569 Unspecified convulsions: Secondary | ICD-10-CM | POA: Diagnosis not present

## 2023-11-03 DIAGNOSIS — H3530 Unspecified macular degeneration: Secondary | ICD-10-CM | POA: Diagnosis not present

## 2023-11-03 DIAGNOSIS — I10 Essential (primary) hypertension: Secondary | ICD-10-CM | POA: Diagnosis not present

## 2023-11-03 DIAGNOSIS — I480 Paroxysmal atrial fibrillation: Secondary | ICD-10-CM

## 2023-11-03 NOTE — Progress Notes (Signed)
 Location:  [facility]  POS: Clinic  Provider: Raylene Calamity, ANP  Code Status: DNR Goals of Care:     11/03/2023    1:45 PM  Advanced Directives  Does Patient Have a Medical Advance Directive? Yes  Type of Estate agent of [place];Living will;Out of facility DNR (pink MOST or yellow form)  Does patient want to make changes to medical advance directive? No - Patient declined  Copy of Healthcare Power of Attorney in Chart? No - copy requested     Chief Complaint  Patient presents with   Medical Management of Chronic Issues    Patient is being seen for 3 month follow up     HPI: Patient is a 89 y.o. female seen today for medical management of chronic diseases.    [place], husband retired physician with memory loss and resides in the memory care unit  Hx of left SDH (not on anticoagulation and no fall) and complex partial seizure in 06/26/2020. Continues on Keppra  and followed by neurology with no new issues.  PAF: Seen by cardiology, can not take DOAC due to bleeding risk.  Rate controlled on Toprol . She denies chest pain or palpitations.    Macular degeneration, right eye is almost blind. Now getting injections in the left eye to prevent further vision loss.   HTN controlled 110/74  HLD LDL 141 01/25/22  MMSE: 30/30 2024. No reports of memory issues. Doing well in AL which is appropriate due to her vision loss.   TSH 4.22   Wt Readings from Last 3 Encounters:  11/03/23 152 lb 6.4 oz (69.1 kg)  07/29/23 154 lb 3.2 oz (69.9 kg)  06/03/23 155 lb (70.3 kg)    Discussed the use of AI scribe software for clinical note transcription with the patient, who gave verbal consent to proceed.  History of Present Illness   Nichole Cox is a 89 year old female with atrial fibrillation and macular degeneration who presents for a follow-up visit.  She has a history of atrial fibrillation seen by cardiology. No palpitations or chest pain. Her pulse  was noted to be 60 today, and she mentions it was 80 on Sunday. She takes metoprolol  and Altace  for heart rate and blood pressure control.  She has macular degeneration and receives injections in her left eye every eight weeks to slow the progression of wet macular degeneration. She describes her vision as variable, with a small central blind spot today but good peripheral vision. Her left eye is considered her 'good eye.'  She is on Keppra  for seizure prevention and reports no new seizures. The condition appears stable with current medication.  She describes her medication routine, noting she takes six pills in the morning and two in the evening. Fredrik Jensen, a nurse, prepares her medications each morning.  No issues with bowel movements, describing them as 'like clockwork every day.' No bladder tenderness or swelling in her ankles, although she notes her ankles are not as 'skinny' as she used to be, attributing this to her heart medication.  She resides in assisted living and manages her own dressing and bathing. No concerns about her memory and recently played a game where she recalled all the state capitals.       Past Medical History:  Diagnosis Date   Atrial fib/flutter, transient (HCC)    Focal seizures (HCC)    Per [facility] new patient packet/ Right hand    Hearing loss    Hyperlipidemia  Hypertension    Kidney stone    Macular degeneration    Per [facility] new patient packet   Nephrolithiasis    SDH (subdural hematoma) (HCC)     Past Surgical History:  Procedure Laterality Date   CATARACT EXTRACTION  1997   Per [facility] new patient packet   KIDNEY STONE SURGERY     Per [facility] new patient packet   KIDNEY STONE SURGERY  1959, 68, 81   55 CUFF REPAIR  2010   SHOULDER SURGERY  2008   [employer] amnesia  2020    Allergies  Allergen Reactions   Scallops [Shellfish Allergy] Hives and Shortness Of Breath   Sulfa Antibiotics Rash    Outpatient Encounter Medications as of 11/03/2023   Medication Sig   acetaminophen  (TYLENOL ) 325 MG tablet Take 2 tablets (650 mg total) by mouth 3 (three) times daily as needed.   Cyanocobalamin (VITAMIN B 12 PO) Take 1,000 mcg/day by mouth daily.   latanoprost  (XALATAN ) 0.005 % ophthalmic solution Place 1 drop into both eyes at bedtime.   levETIRAcetam  (KEPPRA ) 500 MG tablet Take 1 tablet (500 mg total) by mouth 2 (two) times daily.   metoprolol  succinate (TOPROL -XL) 50 MG 24 hr tablet Take 1.5 tablets (75 mg total) by mouth daily. Take with or immediately following a meal.   Multiple Vitamins-Minerals (PRESERVISION AREDS) CAPS Take 1 capsule by mouth in the morning and at bedtime.   ramipril  (ALTACE ) 5 MG capsule Take 5 mg by mouth daily. Hold for systolic reading of blood pressure below 90.   tobramycin (TOBREX) 0.3 % ophthalmic solution Place 1 drop into the left eye 4 (four) times daily. Place 1 drop into the left eye 4 (four) times daily. X 3 days (Patient not taking: Reported on 11/03/2023)   No facility-administered encounter medications on file as of 11/03/2023.    Review of Systems:  Review of Systems  Constitutional:  Negative for activity change, appetite change, chills, diaphoresis, fatigue, fever and unexpected weight change.  HENT:  Negative for congestion.   Eyes:  Positive for visual disturbance (follows with ophthalmology).  Respiratory:  Negative for cough, shortness of breath and wheezing.   Cardiovascular:  Negative for chest pain, palpitations and leg swelling.  Gastrointestinal:  Negative for abdominal distention, abdominal pain, constipation and diarrhea.  Genitourinary:  Negative for difficulty urinating and dysuria.  Musculoskeletal:  Negative for arthralgias, back pain, gait problem, joint swelling and myalgias.  Neurological:  Negative for dizziness, tremors, seizures, syncope, facial asymmetry, speech difficulty, weakness, light-headedness, numbness and headaches.  Psychiatric/Behavioral:  Negative for agitation,  behavioral problems and confusion.     Health Maintenance  Topic Date Due   Zoster Vaccines- Shingrix (1 of 2) Never done   DEXA SCAN  Never done   COVID-19 Vaccine (7 - 2024-25 season) 05/25/2023   Medicare Annual Wellness (AWV)  02/24/2024   DTaP/Tdap/Td (2 - Td or Tdap) 02/02/2031   Pneumonia Vaccine 17+ Years old  Completed   INFLUENZA VACCINE  Completed   HPV VACCINES  Aged Out    Physical Exam: Vitals:   11/03/23 1342  BP: 110/74  Pulse: 64  Temp: 97.6 F (36.4 C)  TempSrc: Temporal  SpO2: 98%  Weight: 152 lb 6.4 oz (69.1 kg)  Height: 5\' 4"  (1.626 m)     Body mass index is 26.16 kg/m. Physical Exam Vitals and nursing note reviewed.  Constitutional:      General: She is not in acute distress.    Appearance: She is  not diaphoretic.  HENT:     Head: Normocephalic and atraumatic.     Right Ear: Ear canal and external ear normal. There is no impacted cerumen.     Left Ear: Tympanic membrane, ear canal and external ear normal. There is no impacted cerumen.     Nose: Nose normal.     Mouth/Throat:     Mouth: Mucous membranes are moist.     Pharynx: Oropharynx is clear.  Eyes:     Conjunctiva/sclera: Conjunctivae normal.     Pupils: Pupils are equal, round, and reactive to light.  Neck:     Vascular: No JVD.  Cardiovascular:     Rate and Rhythm: Normal rate and regular rhythm.     Heart sounds: No murmur heard. Pulmonary:     Effort: Pulmonary effort is normal. No respiratory distress.     Breath sounds: Normal breath sounds. No wheezing.  Abdominal:     General: Bowel sounds are normal. There is no distension.     Palpations: Abdomen is soft.     Tenderness: There is no abdominal tenderness.  Musculoskeletal:     Right lower leg: No edema.     Left lower leg: No edema.  Skin:    General: Skin is warm and dry.  Neurological:     Mental Status: She is alert and oriented to person, place, and time.  Psychiatric:        Mood and Affect: Mood normal.      Labs reviewed: Basic Metabolic Panel: Recent Labs    11/08/22 0000  NA 135*  K 4.7  CL 98*  CO2 22  BUN 15  CREATININE 1.0  CALCIUM 9.2  TSH 5.61   Liver Function Tests: Recent Labs    11/08/22 0000  AST 20  ALT 22  ALKPHOS 3.7*  ALBUMIN 3.7    No results for input(s): "LIPASE", "AMYLASE" in the last 8760 hours. No results for input(s): "AMMONIA" in the last 8760 hours. CBC: No results for input(s): "WBC", "NEUTROABS", "HGB", "HCT", "MCV", "PLT" in the last 8760 hours.  Lipid Panel: No results for input(s): "CHOL", "HDL", "LDLCALC", "TRIG", "CHOLHDL", "LDLDIRECT" in the last 8760 hours.  Lab Results  Component Value Date   HGBA1C 5.6 06/26/2020    Procedures since last visit: No results found.  Assessment/Plan     Atrial Fibrillation No new symptoms or concerns. Pulse rate controlled. -Continue current regimen of Metoprolol  and Altace .  Seizure Disorder No new seizures or concerns. -Continue Keppra  for seizure prophylaxis.  Age-Related Macular Degeneration Receiving regular injections for wet macular degeneration. No new vision concerns. -Continue current ophthalmologic treatment plan.  Hypertension Blood pressure well controlled with current regimen. -Continue Metoprolol  and Altace .  General Health Maintenance -Continue current vaccination schedule. Patient has received flu and COVID vaccines. Declines Shingles vaccine. -Continue current monitoring of thyroid function. TSH slightly elevated but stable. -Follow-up in 3 months or sooner if needed.        Labs/tests ordered:  * No order type specified * Next appt:  3 months with Dr Crissie Dome   Total time :  time greater than 50% of total time spent doing pt counseling and coordination of care     Declined shingrix vaccine

## 2023-12-31 ENCOUNTER — Non-Acute Institutional Stay: Payer: Self-pay | Admitting: Orthopedic Surgery

## 2023-12-31 ENCOUNTER — Encounter: Payer: Self-pay | Admitting: Orthopedic Surgery

## 2023-12-31 DIAGNOSIS — R197 Diarrhea, unspecified: Secondary | ICD-10-CM | POA: Diagnosis not present

## 2023-12-31 MED ORDER — SACCHAROMYCES BOULARDII 250 MG PO CAPS: 250.0000 mg | ORAL_CAPSULE | Freq: Two times a day (BID) | ORAL | Status: AC

## 2023-12-31 NOTE — Progress Notes (Signed)
 Location[facility] Room Number: 623/A Place of Service:  ALF [phone]) Provider:  Octavia Heir, NP   Mahlon Gammon, MD  Patient Care Team: Mahlon Gammon, MD as PCP - General (Internal Medicine) [place], [facility] as Consulting Physician (Neurosurgery) Levert Feinstein, MD as Consulting Physician (Neurology)  Extended Emergency Contact Information Primary Emergency Contact: Dickman,CECIL Address: [address]          [place], [place] [address] Darden Amber of [place] Home Phone: [phone] Relation: Spouse Secondary Emergency Contact: Renne Crigler Address: [address]          [place], [place] [address] Darden Amber of [place] Home Phone: [phone] Mobile Phone: [phone] Relation: Daughter  Code Status:  DNR Goals of care: Advanced Directive information    11/03/2023    1:45 PM  Advanced Directives  Does Patient Have a Medical Advance Directive? Yes  Type of Estate agent of [place];Living will;Out of facility DNR (pink MOST or yellow form)  Does patient want to make changes to medical advance directive? No - Patient declined  Copy of Healthcare Power of Attorney in Chart? No - copy requested     Chief Complaint  Patient presents with   Acute Visit    Vomiting, diarrhea    HPI:  Pt is a 89 y.o. female seen today for acute visit due vomiting and diarrhea.   She currently resides on the assisted living unit at [facility]. PMH: PAF, HTN, HLD, constipation, epilepsy, SDH, CKD, seizure and macular degeneration.  04/04 she visited her husband in memory care and he was not feeling well. His symptoms included fatigue and one episode loose stool. 04/06 she ate dinner and vomited a few hours later. She also had multiple episodes of diarrhea. Multiple episodes of diarrhea until 04/08. She is unsure if symptoms related to husbands. Denies food poisoning. Bowel movements were  normal until falling ill 04/06. Today, she reports only one episode of diarrhea today. She is able to keep down bland foods. She is also drinking Gatorade. Denies abdominal pain, nausea, fever, body aches or malaise. Afebrile. Vitals stable.     Past Medical History:  Diagnosis Date   Atrial fib/flutter, transient (HCC)    Focal seizures (HCC)    Per [facility] new patient packet/ Right hand    Hearing loss    Hyperlipidemia    Hypertension    Kidney stone    Macular degeneration    Per [facility] new patient packet   Nephrolithiasis    SDH (subdural hematoma) (HCC)    Past Surgical History:  Procedure Laterality Date   CATARACT EXTRACTION  1997   Per [facility] new patient packet   KIDNEY STONE SURGERY     Per [facility] new patient packet   KIDNEY STONE SURGERY  1959, 35, 89   53 CUFF REPAIR  2010   SHOULDER SURGERY  2008   [employer] amnesia  2020    Allergies  Allergen Reactions   Scallops [Shellfish Allergy] Hives and Shortness Of Breath   Sulfa Antibiotics Rash    Outpatient Encounter Medications as of 12/31/2023  Medication Sig   acetaminophen (TYLENOL) 325 MG tablet Take 2 tablets (650 mg total) by mouth 3 (three) times daily as needed.   Cyanocobalamin (VITAMIN B 12 PO) Take 1,000 mcg/day by mouth daily.   latanoprost (XALATAN) 0.005 % ophthalmic solution Place 1 drop into both eyes at bedtime.   levETIRAcetam (KEPPRA) 500 MG tablet Take 1  tablet (500 mg total) by mouth 2 (two) times daily.   metoprolol succinate (TOPROL-XL) 50 MG 24 hr tablet Take 1.5 tablets (75 mg total) by mouth daily. Take with or immediately following a meal.   Multiple Vitamins-Minerals (PRESERVISION AREDS) CAPS Take 1 capsule by mouth in the morning and at bedtime.   ramipril (ALTACE) 5 MG capsule Take 5 mg by mouth daily. Hold for systolic reading of blood pressure below 90.   No facility-administered encounter medications on file as of 12/31/2023.    Review of Systems  Constitutional:  Negative for  fatigue and fever.  HENT:  Negative for congestion, ear pain, sore throat and trouble swallowing.   Respiratory:  Negative for cough.   Gastrointestinal:  Positive for diarrhea, nausea and vomiting. Negative for constipation.  Musculoskeletal:  Negative for myalgias.  Neurological:  Negative for dizziness, weakness and light-headedness.  Psychiatric/Behavioral:  Negative for dysphoric mood. The patient is not nervous/anxious.     Immunization History  Administered Date(s) Administered   Fluad Quad(high Dose 65+) 06/29/2020, 06/28/2022, 07/15/2023   Influenza-Unspecified 07/02/2021   Moderna Covid-19 Fall Seasonal Vaccine 22yrs & older 07/30/2022   Moderna Sars-Covid-2 Vaccination 10/01/2019, 10/29/2019, 08/08/2020, 07/24/2021   Pneumococcal Conjugate-13 09/24/2015   Pneumococcal Polysaccharide-23 09/23/2017   Tdap 02/01/2021   Tetanus 01/10/2021   Unspecified SARS-COV-2 Vaccination 02/07/2022   Pertinent  Health Maintenance Due  Topic Date Due   INFLUENZA VACCINE  04/23/2024   DEXA SCAN  Discontinued      02/24/2023    1:26 PM 04/14/2023    1:11 PM 04/21/2023    3:52 PM 07/29/2023   10:55 AM 11/03/2023    1:45 PM  Fall Risk  Falls in the past year? 0 0 0 0 0  Was there an injury with Fall? 0 0 0 0 0  Fall Risk Category Calculator 0 0 0 0 0  Patient at Risk for Falls Due to No Fall Risks No Fall Risks No Fall Risks No Fall Risks No Fall Risks  Fall risk Follow up Falls evaluation completed Falls evaluation completed Falls evaluation completed Falls evaluation completed Falls evaluation completed   Functional Status Survey:    Vitals:   12/31/23 1609  BP: 124/79  Pulse: 76  Resp: 19  Temp: 97.8 F (36.6 C)  SpO2: 97%  Weight: 149 lb (67.6 kg)  Height: 5\' 4"  (1.626 m)   Body mass index is 25.58 kg/m. Physical Exam Vitals reviewed.  Constitutional:      General: She is not in acute distress.    Appearance: She is not ill-appearing.  HENT:     Head: Normocephalic.   Eyes:     General:        Right eye: No discharge.        Left eye: No discharge.  Cardiovascular:     Rate and Rhythm: Normal rate. Rhythm irregular.     Pulses: Normal pulses.     Heart sounds: Normal heart sounds.  Pulmonary:     Effort: Pulmonary effort is normal.     Breath sounds: Normal breath sounds.  Abdominal:     General: There is no distension.     Palpations: Abdomen is soft. There is no mass.     Tenderness: There is no abdominal tenderness. There is no guarding or rebound.     Hernia: No hernia is present.     Comments: Hyperactive bowel sounds  Musculoskeletal:     Cervical back: Neck supple.     Right  lower leg: No edema.     Left lower leg: No edema.  Skin:    General: Skin is warm.     Capillary Refill: Capillary refill takes less than 2 seconds.  Neurological:     General: No focal deficit present.     Mental Status: She is alert and oriented to person, place, and time.  Psychiatric:        Mood and Affect: Mood normal.     Labs reviewed: No results for input(s): "NA", "K", "CL", "CO2", "GLUCOSE", "BUN", "CREATININE", "CALCIUM", "MG", "PHOS" in the last 8760 hours. No results for input(s): "AST", "ALT", "ALKPHOS", "BILITOT", "PROT", "ALBUMIN" in the last 8760 hours. No results for input(s): "WBC", "NEUTROABS", "HGB", "HCT", "MCV", "PLT" in the last 8760 hours. Lab Results  Component Value Date   TSH 5.61 11/08/2022   Lab Results  Component Value Date   HGBA1C 5.6 06/26/2020   Lab Results  Component Value Date   CHOL 217 (A) 01/25/2022   HDL 61 01/25/2022   LDLCALC 141 01/25/2022   TRIG 80 01/25/2022   CHOLHDL 3.3 06/26/2020    Significant Diagnostic Results in last 30 days:  No results found.  Assessment/Plan 1. Diarrhea, unspecified type (Primary) - onset 04/06> one episode vomiting, multiple episodes diarrhea until 04/08 - diarrhea has improved  - hyperactive bowel sounds, no distension  - suspect viral in nature  - discussed BRAT  diet, bland foods - cont Gatorade to prevent dehydration - start Florastor x 14 days to promote GI health  - contact PCP if symptoms worsen    Family/ staff Communication: plan discussed with patient and nurse  Labs/tests ordered:  none

## 2024-01-06 LAB — HM DIABETES EYE EXAM

## 2024-02-03 ENCOUNTER — Non-Acute Institutional Stay: Payer: Medicare Other | Admitting: Internal Medicine

## 2024-02-03 ENCOUNTER — Encounter: Payer: Self-pay | Admitting: Internal Medicine

## 2024-02-03 VITALS — BP 126/80 | HR 86 | Temp 97.1°F | Resp 20 | Ht 64.0 in | Wt 152.2 lb

## 2024-02-03 DIAGNOSIS — I10 Essential (primary) hypertension: Secondary | ICD-10-CM | POA: Diagnosis not present

## 2024-02-03 DIAGNOSIS — H3530 Unspecified macular degeneration: Secondary | ICD-10-CM | POA: Diagnosis not present

## 2024-02-03 DIAGNOSIS — L989 Disorder of the skin and subcutaneous tissue, unspecified: Secondary | ICD-10-CM | POA: Diagnosis not present

## 2024-02-03 DIAGNOSIS — R7989 Other specified abnormal findings of blood chemistry: Secondary | ICD-10-CM

## 2024-02-03 DIAGNOSIS — I480 Paroxysmal atrial fibrillation: Secondary | ICD-10-CM

## 2024-02-03 DIAGNOSIS — R569 Unspecified convulsions: Secondary | ICD-10-CM

## 2024-02-03 NOTE — Patient Instructions (Signed)
 [facility] health Dermatology [phone]

## 2024-02-06 NOTE — Progress Notes (Signed)
 Location:  [facility] features editor of Service:  Clinic (12)  Provider:   Code Status: DNR Goals of Care:     11/03/2023    1:45 PM  Advanced Directives  Does Patient Have a Medical Advance Directive? Yes  Type of Estate agent of [place];Living will;Out of facility DNR (pink MOST or yellow form)  Does patient want to make changes to medical advance directive? No - Patient declined  Copy of Healthcare Power of Attorney in Chart? No - copy requested     Chief Complaint  Patient presents with   Follow-up    3 month follow up    HPI: Patient is a 89 y.o. female seen today for medical management of chronic diseases.   Lives in [place] in [place]   H/o PAF Seen by Dr Swaz[place]and No Anticoagulation due to her h/o SDH Patient very aware of her risk of Stroke Continues to stay stable No New recommendation per Dr Sw[place]and   Left subdural hematoma in 10/21 when she was admitted in the hospital with right arm weakness and slurred speech.  Etiology was unclear as she did not have any falls anticoagulation. 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  History of Present Illness   Nichole Cox is a 89 year old female who presents for a routine follow-up visit.  She experienced norovirus in April, which resolved with treatment. She maintains gut health with regular yogurt consumption.  She has a hearing deficit but manages well in most situations, though struggles in group settings.  Her vision is stable  with regular retina specialist visits for injections in her left eye.   She sleeps well and eats adequately, with occasional worry about her grandchildren. Two weeks ago, she injured her toe with her husband's wheelchair, resulting in a loose toenail that is not painful unless bumped.   She does not experience nocturia, drinks water regularly, and has regular bowel movements. No pain, leg swelling, or irregular heart rate sensations are reported.      Past Medical History:  Diagnosis Date   Atrial fib/flutter, transient (HCC)    Focal seizures (HCC)    Per [facility] new patient packet/ Right hand    Hearing loss    Hyperlipidemia    Hypertension    Kidney stone    Macular degeneration    Per [facility] new patient packet   Nephrolithiasis    SDH (subdural hematoma) (HCC)     Past Surgical History:  Procedure Laterality Date   CATARACT EXTRACTION  1997   Per [facility] new patient packet   KIDNEY STONE SURGERY     Per [facility] new patient packet   KIDNEY STONE SURGERY  1959, 3, 10   82 CUFF REPAIR  2010   SHOULDER SURGERY  2008   [employer] amnesia  2020    Allergies  Allergen Reactions   Scallops [Shellfish Allergy] Hives and Shortness Of Breath   Sulfa Antibiotics Rash    Outpatient Encounter Medications as of 02/03/2024  Medication Sig  acetaminophen  (TYLENOL ) 325 MG tablet Take 2 tablets (650 mg total) by mouth 3 (three) times daily as needed.   Cyanocobalamin (VITAMIN B 12 PO) Take 1,000 mcg/day by mouth daily.   latanoprost  (XALATAN ) 0.005 % ophthalmic solution Place 1 drop into both eyes at bedtime.   levETIRAcetam  (KEPPRA ) 500 MG tablet Take 1 tablet (500 mg total) by mouth 2 (two) times daily.   metoprolol  succinate (TOPROL -XL) 50 MG 24 hr tablet Take 1.5 tablets (75 mg total) by mouth daily. Take with or immediately following a meal.   Multiple Vitamins-Minerals (PRESERVISION AREDS) CAPS Take 1 capsule by mouth in the morning and at bedtime.   ramipril  (ALTACE ) 5 MG capsule  Take 5 mg by mouth daily. Hold for systolic reading of blood pressure below 90.   No facility-administered encounter medications on file as of 02/03/2024.    Review of Systems:  Review of Systems  Constitutional:  Negative for activity change and appetite change.  HENT: Negative.    Respiratory:  Negative for cough and shortness of breath.   Cardiovascular:  Negative for leg swelling.  Gastrointestinal:  Negative for constipation.  Genitourinary: Negative.   Musculoskeletal:  Negative for arthralgias, gait problem and myalgias.  Skin: Negative.   Neurological:  Negative for dizziness and weakness.  Psychiatric/Behavioral:  Negative for confusion, dysphoric mood and sleep disturbance.     Health Maintenance  Topic Date Due   Medicare Annual Wellness (AWV)  02/24/2024   Zoster Vaccines- Shingrix (1 of 2) 05/05/2024 (Originally 11/06/1978)   COVID-19 Vaccine (7 - 2024-25 season) 07/22/2024 (Originally 05/25/2023)   INFLUENZA VACCINE  04/23/2024   DTaP/Tdap/Td (2 - Td or Tdap) 02/02/2031   Pneumonia Vaccine 42+ Years old  Completed   HPV VACCINES  Aged Out   Meningococcal B Vaccine  Aged Out   DEXA SCAN  Discontinued    Physical Exam: Vitals:   02/03/24 1338  BP: 126/80  Pulse: 86  Resp: 20  Temp: (!) 97.1 F (36.2 C)  SpO2: 98%  Weight: 152 lb 3.2 oz (69 kg)  Height: 5\' 4"  (1.626 m)   Body mass index is 26.13 kg/m. Physical Exam Vitals reviewed.  Constitutional:      Appearance: Normal appearance.  HENT:     Head: Normocephalic.     Nose: Nose normal.     Mouth/Throat:     Mouth: Mucous membranes are moist.     Pharynx: Oropharynx is clear.  Eyes:     Pupils: Pupils are equal, round, and reactive to light.  Cardiovascular:     Rate and Rhythm: Normal rate and regular rhythm.     Pulses: Normal pulses.     Heart sounds: Normal heart sounds. No murmur heard. Pulmonary:     Effort: Pulmonary effort is normal.     Breath sounds: Normal breath sounds.  Abdominal:      General: Abdomen is flat. Bowel sounds are normal.     Palpations: Abdomen is soft.  Musculoskeletal:        General: No swelling.     Cervical back: Neck supple.  Skin:    General: Skin is warm.     Comments: Small lesion on her Nose  Neurological:     General: No focal deficit present.     Mental Status: She is alert and oriented to person, place, and time.  Psychiatric:        Mood and Affect: Mood normal.        Thought Content: Thought content normal.  Labs reviewed: Basic Metabolic Panel: Recent Labs    10/30/23 0000  NA 135*  K 4.6  CL 101  CO2 24*  BUN 12  CREATININE 0.8  CALCIUM 9.3  TSH 4.22   Liver Function Tests: Recent Labs    10/30/23 0000  AST 23  ALT 28  ALKPHOS 64  ALBUMIN 3.8   No results for input(s): "LIPASE", "AMYLASE" in the last 8760 hours. No results for input(s): "AMMONIA" in the last 8760 hours. CBC: Recent Labs    10/30/23 0000  WBC 3.4  HGB 13.1  HCT 39  PLT 197   Lipid Panel: No results for input(s): "CHOL", "HDL", "LDLCALC", "TRIG", "CHOLHDL", "LDLDIRECT" in the last 8760 hours. Lab Results  Component Value Date   HGBA1C 5.6 06/26/2020    Procedures since last visit: No results found.  Assessment/Plan 1. Skin lesion of face (Primary)  - Ambulatory referral to Dermatology  2. Paroxysmal A-fib (HCC) Metoprolol  No DOAC due to SDH  3. Primary hypertension Ramipril  and Metoprolol   4. Macular degeneration of both eyes, unspecified type   5. Seizure (HCC) Keppra   6. Elevated TSH Normal  7 Mixed hyperlipidemia Refuses Statin 8 B12 deficiency Supplement  9 Stage 3a chronic kidney disease Creat stable   Labs/tests ordered:   Next appt:  03/08/2024

## 2024-03-01 ENCOUNTER — Ambulatory Visit: Admitting: Physician Assistant

## 2024-03-04 LAB — BASIC METABOLIC PANEL WITH GFR
BUN: 12 (ref 4–21)
CO2: 23 — AB (ref 13–22)
Chloride: 101 (ref 99–108)
Creatinine: 0.9 (ref 0.5–1.1)
Glucose: 97
Potassium: 4.5 meq/L (ref 3.5–5.1)
Sodium: 136 — AB (ref 137–147)

## 2024-03-04 LAB — CBC AND DIFFERENTIAL
HCT: 38 (ref 36–46)
Hemoglobin: 12.8 (ref 12.0–16.0)
Platelets: 199 10*3/uL (ref 150–400)
WBC: 3.5

## 2024-03-04 LAB — HEPATIC FUNCTION PANEL
ALT: 28 U/L (ref 7–35)
AST: 23 (ref 13–35)
Alkaline Phosphatase: 63 (ref 25–125)
Bilirubin, Total: 0.4

## 2024-03-04 LAB — COMPREHENSIVE METABOLIC PANEL WITH GFR
Albumin: 3.9 (ref 3.5–5.0)
Calcium: 9.3 (ref 8.7–10.7)
Globulin: 2.1
eGFR: 63

## 2024-03-04 LAB — CBC: RBC: 3.71 — AB (ref 3.87–5.11)

## 2024-03-08 ENCOUNTER — Encounter: Payer: Self-pay | Admitting: Adult Health

## 2024-03-08 ENCOUNTER — Non-Acute Institutional Stay: Admitting: Adult Health

## 2024-03-08 VITALS — BP 124/80 | HR 70 | Temp 97.3°F | Ht 64.0 in | Wt 152.8 lb

## 2024-03-08 DIAGNOSIS — Z0000 Encounter for general adult medical examination without abnormal findings: Secondary | ICD-10-CM | POA: Diagnosis not present

## 2024-03-08 NOTE — Patient Instructions (Signed)
 [name] , Thank you for taking time to come for your Medicare Wellness Visit. I appreciate your ongoing commitment to your health goals. Please review the following plan we discussed and let me know if I can assist you in the future.   Screening recommendations/referrals: Colonoscopy aged out Mammogram aged out Bone Density Declined  Recommended yearly ophthalmology/optometry visit for glaucoma screening and checkup Recommended yearly dental visit for hygiene and checkup  Vaccinations: Influenza vaccine- due annually in September/October Pneumococcal vaccine Recommend Prevnar 20 Tdap vaccine up to date  Shingles vaccine declined    Advanced directives: reviewed   Conditions/risks identified: fall risk   Next appointment: 1 year    Preventive Care 80 Years and Older, Female Preventive care refers to lifestyle choices and visits with your health care provider that can promote health and wellness. What does preventive care include? A yearly physical exam. This is also called an annual well check. Dental exams once or twice a year. Routine eye exams. Ask your health care provider how often you should have your eyes checked. Personal lifestyle choices, including: Daily care of your teeth and gums. Regular physical activity. Eating a healthy diet. Avoiding tobacco and drug use. Limiting alcohol use. Practicing safe sex. Taking low-dose aspirin every day. Taking vitamin and mineral supplements as recommended by your health care provider. What happens during an annual well check? The services and screenings done by your health care provider during your annual well check will depend on your age, overall health, lifestyle risk factors, and family history of disease. Counseling  Your health care provider may ask you questions about your: Alcohol use. Tobacco use. Drug use. Emotional well-being. Home and relationship well-being. Sexual activity. Eating habits. History of  falls. Memory and ability to understand (cognition). Work and work Astronomer. Reproductive health. Screening  You may have the following tests or measurements: Height, weight, and BMI. Blood pressure. Lipid and cholesterol levels. These may be checked every 5 years, or more frequently if you are over 35 years old. Skin check. Lung cancer screening. You may have this screening every year starting at age 39 if you have a 30-pack-year history of smoking and currently smoke or have quit within the past 15 years. Fecal occult blood test (FOBT) of the stool. You may have this test every year starting at age 44. Flexible sigmoidoscopy or colonoscopy. You may have a sigmoidoscopy every 5 years or a colonoscopy every 10 years starting at age 24. Hepatitis C blood test. Hepatitis B blood test. Sexually transmitted disease (STD) testing. Diabetes screening. This is done by checking your blood sugar (glucose) after you have not eaten for a while (fasting). You may have this done every 1-3 years. Bone density scan. This is done to screen for osteoporosis. You may have this done starting at age 33. Mammogram. This may be done every 1-2 years. Talk to your health care provider about how often you should have regular mammograms. Talk with your health care provider about your test results, treatment options, and if necessary, the need for more tests. Vaccines  Your health care provider may recommend certain vaccines, such as: Influenza vaccine. This is recommended every year. Tetanus, diphtheria, and acellular pertussis (Tdap, Td) vaccine. You may need a Td booster every 10 years. Zoster vaccine. You may need this after age 31. Pneumococcal 13-valent conjugate (PCV13) vaccine. One dose is recommended after age 35. Pneumococcal polysaccharide (PPSV23) vaccine. One dose is recommended after age 75. Talk to your health care provider about  which screenings and vaccines you need and how often you need  them. This information is not intended to replace advice given to you by your health care provider. Make sure you discuss any questions you have with your health care provider. Document Released: 10/06/2015 Document Revised: 05/29/2016 Document Reviewed: 07/11/2015 Elsevier Interactive Patient Education  2017 [employer].  Fall Prevention in the Home Falls can cause injuries. They can happen to people of all ages. There are many things you can do to make your home safe and to help prevent falls. What can I do on the outside of my home? Regularly fix the edges of walkways and driveways and fix any cracks. Remove anything that might make you trip as you walk through a door, such as a raised step or threshold. Trim any bushes or trees on the path to your home. Use bright outdoor lighting. Clear any walking paths of anything that might make someone trip, such as rocks or tools. Regularly check to see if handrails are loose or broken. Make sure that both sides of any steps have handrails. Any raised decks and porches should have guardrails on the edges. Have any leaves, snow, or ice cleared regularly. Use sand or salt on walking paths during winter. Clean up any spills in your garage right away. This includes oil or grease spills. What can I do in the bathroom? Use night lights. Install grab bars by the toilet and in the tub and shower. Do not use towel bars as grab bars. Use non-skid mats or decals in the tub or shower. If you need to sit down in the shower, use a plastic, non-slip stool. Keep the floor dry. Clean up any water that spills on the floor as soon as it happens. Remove soap buildup in the tub or shower regularly. Attach bath mats securely with double-sided non-slip rug tape. Do not have throw rugs and other things on the floor that can make you trip. What can I do in the bedroom? Use night lights. Make sure that you have a light by your bed that is easy to reach. Do not use  any sheets or blankets that are too big for your bed. They should not hang down onto the floor. Have a firm chair that has side arms. You can use this for support while you get dressed. Do not have throw rugs and other things on the floor that can make you trip. What can I do in the kitchen? Clean up any spills right away. Avoid walking on wet floors. Keep items that you use a lot in easy-to-reach places. If you need to reach something above you, use a strong step stool that has a grab bar. Keep electrical cords out of the way. Do not use floor polish or wax that makes floors slippery. If you must use wax, use non-skid floor wax. Do not have throw rugs and other things on the floor that can make you trip. What can I do with my stairs? Do not leave any items on the stairs. Make sure that there are handrails on both sides of the stairs and use them. Fix handrails that are broken or loose. Make sure that handrails are as long as the stairways. Check any carpeting to make sure that it is firmly attached to the stairs. Fix any carpet that is loose or worn. Avoid having throw rugs at the top or bottom of the stairs. If you do have throw rugs, attach them to the floor with  carpet tape. Make sure that you have a light switch at the top of the stairs and the bottom of the stairs. If you do not have them, ask someone to add them for you. What else can I do to help prevent falls? Wear shoes that: Do not have high heels. Have rubber bottoms. Are comfortable and fit you well. Are closed at the toe. Do not wear sandals. If you use a stepladder: Make sure that it is fully opened. Do not climb a closed stepladder. Make sure that both sides of the stepladder are locked into place. Ask someone to hold it for you, if possible. Clearly mark and make sure that you can see: Any grab bars or handrails. First and last steps. Where the edge of each step is. Use tools that help you move around (mobility aids)  if they are needed. These include: Canes. Walkers. Scooters. Crutches. Turn on the lights when you go into a dark area. Replace any light bulbs as soon as they burn out. Set up your furniture so you have a clear path. Avoid moving your furniture around. If any of your floors are uneven, fix them. If there are any pets around you, be aware of where they are. Review your medicines with your doctor. Some medicines can make you feel dizzy. This can increase your chance of falling. Ask your doctor what other things that you can do to help prevent falls. This information is not intended to replace advice given to you by your health care provider. Make sure you discuss any questions you have with your health care provider. Document Released: 07/06/2009 Document Revised: 02/15/2016 Document Reviewed: 10/14/2014 Elsevier Interactive Patient Education  2017 [employer].

## 2024-03-08 NOTE — Progress Notes (Signed)
 Subjective:   Nichole Cox is a 89 y.o. female who presents for Medicare Annual (Subsequent) preventive examination at [facility]   Visit Complete: In person  Patient Medicare AWV questionnaire was completed by the patient on 6//16/25; I have confirmed that all information answered by patient is correct and no changes since this date.  Cardiac Risk Factors include: advanced age (>77men, >68 women);hypertension     Objective:    Today's Vitals   03/08/24 1354  BP: 124/80  Pulse: 70  Temp: (!) 97.3 F (36.3 C)  SpO2: 98%  Weight: 152 lb 12.8 oz (69.3 kg)  Height: 5' 4 (1.626 m)   Body mass index is 26.23 kg/m.     11/03/2023    1:45 PM 07/29/2023   10:55 AM 04/21/2023    3:53 PM 04/14/2023   12:40 PM 02/24/2023    1:27 PM 01/07/2023    1:46 PM 01/07/2023    1:34 PM  Advanced Directives  Does Patient Have a Medical Advance Directive? Yes Yes Yes Yes Yes Yes Yes  Type of Estate agent of [place];Living will;Out of facility DNR (pink MOST or yellow form) Living will;Healthcare Power of [place];Out of facility DNR (pink MOST or yellow form) Healthcare Power of [place];Living will;Out of facility DNR (pink MOST or yellow form) Healthcare Power of [place];Living will;Out of facility DNR (pink MOST or yellow form) Healthcare Power of [place];Living will Healthcare Power of [place];Living will Healthcare Power of [place];Living will  Does patient want to make changes to medical advance directive? No - Patient declined No - Patient declined No - Patient declined No - Patient declined No - Patient declined No - Patient declined   Copy of Healthcare Power of Attorney in Chart? No - copy requested No - copy requested No - copy requested No - copy requested No - copy requested No - copy requested     Current Medications (verified) Outpatient Encounter Medications as of 03/08/2024  Medication Sig   acetaminophen  (TYLENOL ) 325 MG tablet Take 2  tablets (650 mg total) by mouth 3 (three) times daily as needed.   cholecalciferol (VITAMIN D3) 25 MCG (1000 UNIT) tablet Take 2,000 Units by mouth daily.   Cyanocobalamin (VITAMIN B 12 PO) Take 1,000 mcg/day by mouth daily.   latanoprost  (XALATAN ) 0.005 % ophthalmic solution Place 1 drop into both eyes at bedtime.   levETIRAcetam  (KEPPRA ) 500 MG tablet Take 1 tablet (500 mg total) by mouth 2 (two) times daily.   metoprolol  succinate (TOPROL -XL) 50 MG 24 hr tablet Take 1.5 tablets (75 mg total) by mouth daily. Take with or immediately following a meal.   Multiple Vitamins-Minerals (PRESERVISION AREDS) CAPS Take 1 capsule by mouth in the morning and at bedtime.   ramipril  (ALTACE ) 5 MG capsule Take 5 mg by mouth daily. Hold for systolic reading of blood pressure below 90.   No facility-administered encounter medications on file as of 03/08/2024.    Allergies (verified) Scallops [shellfish allergy] and Sulfa antibiotics   History: Past Medical History:  Diagnosis Date   Atrial fib/flutter, transient (HCC)    Focal seizures (HCC)    Per [facility] new patient packet/ Right hand    Hearing loss    Hyperlipidemia    Hypertension    Kidney stone    Macular degeneration    Per [facility] new patient packet   Nephrolithiasis    SDH (subdural hematoma) (HCC)    Past Surgical History:  Procedure Laterality Date   CATARACT EXTRACTION  1997   Per [facility] new patient packet   KIDNEY STONE SURGERY     Per [facility] new patient packet   KIDNEY STONE SURGERY  1959, 42, 73   ROTATOR CUFF REPAIR  2010   SHOULDER SURGERY  2008   transglobal amnesia  2020   Family History  Problem Relation Age of Onset   Heart failure Mother        age 81   Lung cancer Father        age 22   Social History   Socioeconomic History   Marital status: Married    Spouse name: cecil   Number of children: 2   Years of education: college   Highest education level: Bachelor's degree (e.g., BA, AB, BS)  Occupational History    Occupation: Runner, broadcasting/film/video    Comment: Retired / [employer]   Tobacco Use   Smoking status: Former    Current packs/day: 1.00    Average packs/day: 1 pack/day for 13.0 years (13.0 ttl pk-yrs)    Types: Cigarettes   Smokeless tobacco: Never  Vaping Use   Vaping status: Never Used  Substance and Sexual Activity   Alcohol use: Yes    Comment: 1-2 drinks of scotch per night   Drug use: Never   Sexual activity: Not Currently  Other Topics Concern   Not on file  Social History Narrative   Diet Healthy      Do you drink/eat things with caffeine coffee  Morning only      Marital Status Married            What year were you married? 1955      Do you live in a house, apartment, assisted living, condo, trailer, etc.? Townhome      Is it one or more stories? 2 story      How many persons live in your home? 3         Do you have any pets in your home?(please list) Dog      Highest level of education completed: College degree      Current or past profession: Teacher    Right handed   Do you exercise?:Yes    Type and how often: Walk, daily      Do you have a Living Will? (Form that indicates scenarios where you would not want your life prolonged) Yes      Do you have a DNR form?         If not, would you like to discuss one? No      Do you have signed POA/HPOA forms? Yes      Do you have difficulty bathing or dressing yourself? No      Do you have difficulty preparing food or eating? No      Do you have difficulty managing medications? No      Do you have difficulty managing your finances? No      Do you have difficulty affording your medications? No      Lives at home with her husband (retired MD - Sports administrator) and daughter (retired Charity fundraiser).   Right-handed.   1.5 cups per day.                  Social Drivers of Corporate investment banker Strain: Not on file  Food Insecurity: Not on file  Transportation Needs: Not on file  Physical Activity: Not on file  Stress: Not on  file  Social Connections: Not on  file    Tobacco Counseling Counseling given: Not Answered   Clinical Intake:  Pre-visit preparation completed: No  Pain : No/denies pain     BMI - recorded: 26.23 Nutritional Status: BMI 25 -29 Overweight Nutritional Risks: None Diabetes: No  How often do you need to have someone help you when you read instructions, pamphlets, or other written materials from your doctor or pharmacy?: 1 - Never What is the last grade level you completed in school?: Bachelors  Interpreter Needed?: No  Information entered by :: Craige Dixon Nazaret Chea NP   Activities of Daily Living    03/08/2024    2:24 PM  In your present state of health, do you have any difficulty performing the following activities:  Hearing? 0  Vision? 1  Difficulty concentrating or making decisions? 0  Walking or climbing stairs? 0  Dressing or bathing? 0  Doing errands, shopping? 0  Preparing Food and eating ? N  Using the Toilet? N  In the past six months, have you accidently leaked urine? N  Do you have problems with loss of bowel control? N  Managing your Medications? N  Managing your Finances? N  Housekeeping or managing your Housekeeping? N    Patient Care Team: Marguerite Shiley, MD as PCP - General (Internal Medicine) [place], [facility] as Consulting Physician (Neurosurgery) Phebe Brasil, MD as Consulting Physician (Neurology)  Indicate any recent Medical Services you may have received from other than [facility] providers in the past year (date may be approximate).     Assessment:   This is a routine wellness examination for [place].  Hearing/Vision screen Hearing Screening - Comments:: No hearing issue Vision Screening - Comments:: Dr Marlane Silver   Goals Addressed   None    Depression Screen    03/08/2024    2:01 PM 11/03/2023    1:45 PM 07/29/2023   10:55 AM 04/21/2023    3:52 PM 04/14/2023    1:11 PM 02/24/2023    1:34 PM 02/24/2023    1:26 PM  PHQ  2/9 Scores  PHQ - 2 Score 0 0 0 0 0 0 0    Fall Risk    03/08/2024    2:01 PM 11/03/2023    1:45 PM 07/29/2023   10:55 AM 04/21/2023    3:52 PM 04/14/2023    1:11 PM  Fall Risk   Falls in the past year? 0 0 0 0 0  Number falls in past yr: 0 0 0 0 0  Injury with Fall? 0 0 0 0 0  Risk for fall due to : No Fall Risks No Fall Risks No Fall Risks No Fall Risks No Fall Risks  Follow up Falls evaluation completed Falls evaluation completed Falls evaluation completed Falls evaluation completed Falls evaluation completed    MEDICARE RISK AT HOME: Medicare Risk at Home Any stairs in or around the home?: No If so, are there any without handrails?: No Home free of loose throw rugs in walkways, pet beds, electrical cords, etc?: Yes Adequate lighting in your home to reduce risk of falls?: Yes Life alert?: No Use of a cane, walker or w/c?: No Grab bars in the bathroom?: Yes Shower chair or bench in shower?: No Elevated toilet seat or a handicapped toilet?: Yes  TIMED UP AND GO:  Was the test performed?  No    Cognitive Function:    02/24/2023    1:35 PM  MMSE - Mini Mental State Exam  Orientation to time 5  Orientation to Place 5  Registration 3  Attention/ Calculation 5  Recall 3  Language- name 2 objects 2  Language- repeat 1  Language- follow 3 step command 3  Language- read & follow direction 1  Write a sentence 1  Copy design 1  Total score 30        03/08/2024    2:01 PM 02/19/2022   11:15 AM  6CIT Screen  What Year? 0 points 0 points  What month? 0 points 0 points  What time? 0 points 0 points  Count back from 20 0 points 0 points  Months in reverse 0 points 0 points  Repeat phrase 0 points 0 points  Total Score 0 points 0 points    Immunizations Immunization History  Administered Date(s) Administered   Fluad Quad(high Dose 65+) 06/29/2020, 06/28/2022, 07/15/2023   Influenza-Unspecified 07/02/2021   Moderna Covid-19 Fall Seasonal Vaccine 36yrs & older  07/30/2022   Moderna Sars-Covid-2 Vaccination 10/01/2019, 10/29/2019, 08/08/2020, 07/24/2021   Pneumococcal Conjugate-13 09/24/2015   Pneumococcal Polysaccharide-23 09/23/2017   Tdap 02/01/2021   Tetanus 01/10/2021   Unspecified SARS-COV-2 Vaccination 02/07/2022    TDAP status: Up to date  Flu Vaccine status: Up to date  Pneumococcal vaccine status: Due, Education has been provided regarding the importance of this vaccine. Advised may receive this vaccine at local pharmacy or Health Dept. Aware to provide a copy of the vaccination record if obtained from local pharmacy or Health Dept. Verbalized acceptance and understanding.  Covid-19 vaccine status: Completed vaccines  Qualifies for Shingles Vaccine? Yes   Zostavax completed No   Shingrix Completed?: No.    Education has been provided regarding the importance of this vaccine. Patient has been advised to call insurance company to determine out of pocket expense if they have not yet received this vaccine. Advised may also receive vaccine at local pharmacy or Health Dept. Verbalized acceptance and understanding.  Screening Tests Health Maintenance  Topic Date Due   Zoster Vaccines- Shingrix (1 of 2) 05/05/2024 (Originally 11/06/1978)   COVID-19 Vaccine (7 - 2024-25 season) 07/22/2024 (Originally 05/25/2023)   INFLUENZA VACCINE  04/23/2024   Medicare Annual Wellness (AWV)  03/08/2025   DTaP/Tdap/Td (2 - Td or Tdap) 02/02/2031   Pneumococcal Vaccine: 50+ Years  Completed   HPV VACCINES  Aged Out   Meningococcal B Vaccine  Aged Out   DEXA SCAN  Discontinued    Health Maintenance  There are no preventive care reminders to display for this patient.   Colorectal cancer screening: No longer required.   Mammogram status: No longer required due to NA.  Bone Density status: Ordered declined. Pt provided with contact info and advised to call to schedule appt.  Lung Cancer Screening: (Low Dose CT Chest recommended if Age 37-80 years, 20  pack-year currently smoking OR have quit w/in 15years.) does not qualify.   Lung Cancer Screening Referral: NA  Additional Screening:  Hepatitis C Screening: does not qualify; Completed NA  Vision Screening: Recommended annual ophthalmology exams for early detection of glaucoma and other disorders of the eye. Is the patient up to date with their annual eye exam?  Yes  Who is the provider or what is the name of the office in which the patient attends annual eye exams? Retina specialist Torrie Frei If pt is not established with a provider, would they like to be referred to a provider to establish care? No .   Dental Screening: Recommended annual dental exams for proper oral hygiene  Diabetic Foot  Exam: Na  Community Resource Referral / Chronic Care Management: CRR required this visit?  No   CCM required this visit?  No     Plan:     I have personally reviewed and noted the following in the patient's chart:   Medical and social history Use of alcohol, tobacco or illicit drugs  Current medications and supplements including opioid prescriptions. Patient is not currently taking opioid prescriptions. Functional ability and status Nutritional status Physical activity Advanced directives List of other physicians Hospitalizations, surgeries, and ER visits in previous 12 months Vitals Screenings to include cognitive, depression, and falls Referrals and appointments  In addition, I have reviewed and discussed with patient certain preventive protocols, quality metrics, and best practice recommendations. A written personalized care plan for preventive services as well as general preventive health recommendations were provided to patient.     Raylene Calamity, NP   03/08/2024   After Visit Summary: (In Person-Printed) AVS printed and given to the patient  Nurse Notes: She is going to have an apt with dermatology tomorrow regarding some skin lesions removed which she believes are  basal cells.

## 2024-03-09 ENCOUNTER — Ambulatory Visit (INDEPENDENT_AMBULATORY_CARE_PROVIDER_SITE_OTHER): Admitting: Physician Assistant

## 2024-03-09 ENCOUNTER — Encounter: Payer: Self-pay | Admitting: Physician Assistant

## 2024-03-09 VITALS — BP 158/89

## 2024-03-09 DIAGNOSIS — C4491 Basal cell carcinoma of skin, unspecified: Secondary | ICD-10-CM

## 2024-03-09 DIAGNOSIS — L578 Other skin changes due to chronic exposure to nonionizing radiation: Secondary | ICD-10-CM | POA: Diagnosis not present

## 2024-03-09 DIAGNOSIS — C4442 Squamous cell carcinoma of skin of scalp and neck: Secondary | ICD-10-CM | POA: Diagnosis not present

## 2024-03-09 DIAGNOSIS — C44311 Basal cell carcinoma of skin of nose: Secondary | ICD-10-CM

## 2024-03-09 DIAGNOSIS — D485 Neoplasm of uncertain behavior of skin: Secondary | ICD-10-CM

## 2024-03-09 DIAGNOSIS — C4492 Squamous cell carcinoma of skin, unspecified: Secondary | ICD-10-CM

## 2024-03-09 DIAGNOSIS — Z85828 Personal history of other malignant neoplasm of skin: Secondary | ICD-10-CM

## 2024-03-09 DIAGNOSIS — D492 Neoplasm of unspecified behavior of bone, soft tissue, and skin: Secondary | ICD-10-CM | POA: Diagnosis not present

## 2024-03-09 DIAGNOSIS — Z8589 Personal history of malignant neoplasm of other organs and systems: Secondary | ICD-10-CM

## 2024-03-09 DIAGNOSIS — C44319 Basal cell carcinoma of skin of other parts of face: Secondary | ICD-10-CM | POA: Diagnosis not present

## 2024-03-09 HISTORY — DX: Basal cell carcinoma of skin, unspecified: C44.91

## 2024-03-09 HISTORY — DX: Squamous cell carcinoma of skin, unspecified: C44.92

## 2024-03-09 NOTE — Progress Notes (Signed)
 New Patient Visit   Subjective  Nichole Cox is a 89 y.o. female who presents for the following: She has several spots on her face that she would like checked. She has a history of BCC and SCC. She has had Mohs on her nose many years ago. Her husband is a retired Sports administrator. Accompanied by daughter today.   Verbal from patient and daughter - history of a biopsy proven BCC on her left forehead (B in photograph today) that was performed at [facility] facility here in [place]. There are no records with this information that was sent today.     The following portions of the chart were reviewed this encounter and updated as appropriate: medications, allergies, medical history  Review of Systems:  No other skin or systemic complaints except as noted in HPI or Assessment and Plan.  Objective  Well appearing patient in no apparent distress; mood and affect are within normal limits.  A focused examination was performed of the following areas: Face   Relevant exam findings are noted in the Assessment and Plan.  Frontal scalp 2.0 cm erythematous scaly plaque  Left Forehead 1.7 cm pearly telangiectatic plaque  Right Forehead 1.0 cm pearly telangiectatic plaque  Nasal tip 1.0 cm erythematous scaly patch   Assessment & Plan   History of  Basal Cell Carcinoma   History of Squamous Cell Carcinoma   Actinic skin damage     NEOPLASM OF UNCERTAIN BEHAVIOR OF SKIN (4) Frontal scalp Skin / nail biopsy Type of biopsy: tangential   Informed consent: discussed and consent obtained   Timeout: patient name, date of birth, surgical site, and procedure verified   Procedure prep:  Patient was prepped and draped in usual sterile fashion Prep type:  Isopropyl alcohol Anesthesia: the lesion was anesthetized in a standard fashion   Anesthetic:  1% lidocaine w/ epinephrine 1-100,000 buffered w/ 8.4% NaHCO3 Instrument used: flexible razor blade   Hemostasis achieved  with: pressure, aluminum chloride and electrodesiccation   Outcome: patient tolerated procedure well   Post-procedure details: sterile dressing applied and wound care instructions given   Dressing type: bandage and petrolatum   Specimen 1 - Surgical pathology Differential Diagnosis: BCC vs SCC vs other   Check Margins: No Left Forehead Skin / nail biopsy Type of biopsy: tangential   Informed consent: discussed and consent obtained   Timeout: patient name, date of birth, surgical site, and procedure verified   Procedure prep:  Patient was prepped and draped in usual sterile fashion Prep type:  Isopropyl alcohol Anesthesia: the lesion was anesthetized in a standard fashion   Anesthetic:  1% lidocaine w/ epinephrine 1-100,000 buffered w/ 8.4% NaHCO3 Instrument used: flexible razor blade   Hemostasis achieved with: pressure, aluminum chloride and electrodesiccation   Outcome: patient tolerated procedure well   Post-procedure details: sterile dressing applied and wound care instructions given   Dressing type: bandage and petrolatum   Specimen 2 - Surgical pathology Differential Diagnosis: BCC vs other   Check Margins: No Right Forehead Skin / nail biopsy Type of biopsy: tangential   Informed consent: discussed and consent obtained   Timeout: patient name, date of birth, surgical site, and procedure verified   Procedure prep:  Patient was prepped and draped in usual sterile fashion Prep type:  Isopropyl alcohol Anesthesia: the lesion was anesthetized in a standard fashion   Anesthetic:  1% lidocaine w/ epinephrine 1-100,000 buffered w/ 8.4% NaHCO3 Instrument used: flexible razor blade   Hemostasis achieved with: pressure,  aluminum chloride and electrodesiccation   Outcome: patient tolerated procedure well   Post-procedure details: sterile dressing applied and wound care instructions given   Dressing type: bandage and petrolatum   Specimen 3 - Surgical pathology Differential  Diagnosis: BCC vs other   Check Margins: No Nasal tip Skin / nail biopsy Type of biopsy: tangential   Informed consent: discussed and consent obtained   Timeout: patient name, date of birth, surgical site, and procedure verified   Procedure prep:  Patient was prepped and draped in usual sterile fashion Prep type:  Isopropyl alcohol Anesthesia: the lesion was anesthetized in a standard fashion   Anesthetic:  1% lidocaine w/ epinephrine 1-100,000 buffered w/ 8.4% NaHCO3 Instrument used: flexible razor blade   Hemostasis achieved with: pressure, aluminum chloride and electrodesiccation   Outcome: patient tolerated procedure well   Post-procedure details: sterile dressing applied and wound care instructions given   Dressing type: bandage and petrolatum   Specimen 4 - Surgical pathology Differential Diagnosis: BCC vs SCC vs other   Check Margins: No HISTORY OF BASAL CELL CANCER   HISTORY OF SQUAMOUS CELL CARCINOMA   ACTINIC SKIN DAMAGE    Return in about 3 months (around 06/09/2024) for TBSE.  I, Eliot Guernsey, CMA, am acting as scribe for Giannamarie Paulus K, PA-C .   Documentation: I have reviewed the above documentation for accuracy and completeness, and I agree with the above.  Tamula Morrical K, PA-C

## 2024-03-09 NOTE — Patient Instructions (Signed)

## 2024-03-11 LAB — SURGICAL PATHOLOGY

## 2024-03-15 ENCOUNTER — Ambulatory Visit: Payer: Self-pay | Admitting: Physician Assistant

## 2024-03-18 ENCOUNTER — Encounter: Payer: Self-pay | Admitting: Physician Assistant

## 2024-03-18 NOTE — Telephone Encounter (Signed)
-----   Message from [facility] K sent at 03/15/2024  2:35 PM EDT ----- 1, 2, 3, 4 -- all need Mohs    ----- Message ----- From: Interface, Lab In [facility] Sent: 03/11/2024   5:33 PM EDT To: Erminio MARLA Like, PA-C

## 2024-03-18 NOTE — Telephone Encounter (Signed)
 Advised patient's daughter of pathology results and sent message to schedulers to schedule for Mohs x 4/hd

## 2024-03-23 ENCOUNTER — Other Ambulatory Visit: Payer: Self-pay

## 2024-03-23 MED ORDER — RAMIPRIL 5 MG PO CAPS: 5.0000 mg | ORAL_CAPSULE | Freq: Every day | ORAL | 1 refills | Status: AC

## 2024-04-06 ENCOUNTER — Encounter: Payer: Self-pay | Admitting: Dermatology

## 2024-04-08 ENCOUNTER — Ambulatory Visit: Admitting: Dermatology

## 2024-04-08 ENCOUNTER — Encounter: Payer: Self-pay | Admitting: Dermatology

## 2024-04-08 VITALS — BP 132/86 | HR 86 | Temp 97.9°F

## 2024-04-08 DIAGNOSIS — C4442 Squamous cell carcinoma of skin of scalp and neck: Secondary | ICD-10-CM | POA: Diagnosis not present

## 2024-04-08 DIAGNOSIS — L579 Skin changes due to chronic exposure to nonionizing radiation, unspecified: Secondary | ICD-10-CM

## 2024-04-08 DIAGNOSIS — C4492 Squamous cell carcinoma of skin, unspecified: Secondary | ICD-10-CM

## 2024-04-08 DIAGNOSIS — C44319 Basal cell carcinoma of skin of other parts of face: Secondary | ICD-10-CM

## 2024-04-08 DIAGNOSIS — L814 Other melanin hyperpigmentation: Secondary | ICD-10-CM

## 2024-04-08 DIAGNOSIS — C44310 Basal cell carcinoma of skin of unspecified parts of face: Secondary | ICD-10-CM

## 2024-04-08 NOTE — Progress Notes (Signed)
 Follow-Up Visit   Subjective  Nichole Cox is a 89 y.o. 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 Patient is accompanied by daughter. Husband used to be a Sports administrator.   The following portions of the chart were reviewed this encounter and updated as appropriate: medications, allergies, medical history  Review of Systems:  No other skin or systemic complaints except as noted in HPI or Assessment and Plan.  Objective  Well appearing patient in no apparent distress; mood and affect are within normal limits.  A focused examination was performed of the following areas: Frontal scalp Relevant physical exam findings are noted in the Assessment and Plan.   Frontal Scalp Healing biopsy site   Assessment & Plan   SQUAMOUS CELL CARCINOMA OF SKIN Frontal Scalp Mohs surgery  Consent obtained: written  Anticoagulation: Was the anticoagulation regimen changed prior to Mohs? No    Anesthesia: Anesthesia method: local infiltration Local anesthetic: lidocaine 1% WITH epi  Procedure Details: Timeout: pre-procedure verification complete Procedure Prep: patient was prepped and draped in usual sterile fashion Prep type: chlorhexidine Pre-Op diagnosis: squamous cell carcinoma SCC subtype: well differentiated MohsAIQ Surgical site (if tumor spans multiple areas, please select predominant area): scalp Surgical site (from skin exam): Frontal Scalp Pre-operative length (cm): 1.6 Pre-operative width (cm): 1.1  Micrographic Surgery Details: Post-operative length (cm): 2.3 Post-operative width (cm): 1.5 Number of Mohs stages: 1  Skin repair Complexity:  Complex Final length (cm):  3.5 Informed consent: discussed and consent obtained   Timeout: patient name, date of birth, surgical site, and procedure verified   Procedure prep:  Patient was prepped and draped in usual  sterile fashion Prep type:  Chlorhexidine Anesthesia: the lesion was anesthetized in a standard fashion   Anesthetic:  1% lidocaine w/ epinephrine 1-100,000 buffered w/ 8.4% NaHCO3 Reason for type of repair: reduce tension to allow closure, preserve normal anatomy, preserve normal anatomical and functional relationships, avoid adjacent structures and allow side-to-side closure without requiring a flap or graft   Undermining: area extensively undermined   Subcutaneous layers (deep stitches):  Suture size:  3-0 Suture type: Vicryl (polyglactin 910)   Stitches:  Buried vertical mattress Fine/surface layer approximation (top stitches):  Suture size:  4-0 Suture type: Prolene (polypropylene)   Stitches: vertical mattress and simple running   Hemostasis achieved with: suture, pressure and electrodesiccation Outcome: patient tolerated procedure well with no complications   Post-procedure details: sterile dressing applied and wound care instructions given   Dressing type: bandage and pressure dressing     Basal Cell Carcinoma on the forehead and cheek (total of 3 lesions) At the patient's daughter's request, we discussed potential non-indicated, off-label treatment options for the patient's biopsy-proven basal cell carcinomas (BCCs) located on the head and neck. It was explained that two of the three lesions demonstrated infiltrative and sclerotic histologic subtypes, which are known to be more aggressive and are at higher risk for subclinical extension and recurrence. Given their histopathologic features and high-risk anatomical location, these tumors are most appropriately managed with Mohs micrographic surgery, which offers the highest cure rates while maximizing tissue preservation. We also reviewed the third lesion, which was determined to be a superficial BCC; in theory, this lesion could be considered for off-label topical therapy with imiquimod, although this is not FDA-approved for this  specific location and carries a higher risk of incomplete clearance and recurrence. Superficial radiation therapy (SRT) was  also discussed but not recommended due to significantly higher recurrence rates, especially with aggressive subtypes, and the potential for ultimately larger and more disfiguring surgical defects in the future. The Mohs procedure was reviewed in depth, including the stepwise nature of tissue excision with immediate histologic examination, the advantage in margin control, expected operative time, wound care, and potential for reconstruction in the same visit. Risks, benefits, and alternatives were reviewed at length. The patient and daughter were encouraged to consider the long-term implications of treatment decisions in the context of tumor behavior and anatomic location.  The patient was counseled thoroughly regarding the plan for Mohs micrographic surgery to treat their skin cancer. We discussed the procedure in detail, including the removal of thin layers of tissue for examination under a microscope to ensure complete excision of the cancer while preserving as much healthy tissue as possible. The patient was informed that the surgery may take several hours, with potential additional stages if further tissue removal is necessary to achieve clear margins. We reviewed the risks, which include infection, scarring, and the possibility of requiring reconstructive techniques depending on the location and size of the defect. Reconstruction will occur after the tumor is fully cleared, and depending on the wound size and location, this may involve a flap, graft, linear closure, or sometimes healing by secondary intention. The patient was advised on preoperative and postoperative care, including avoiding certain medications, managing pain, and caring for the surgical site to prevent complications. They were given an opportunity to ask questions, and all concerns were addressed. The patient expressed  understanding and agreed to proceed with the scheduled Mohs surgery.   Return in about 11 days (around 04/19/2024) for wound check/suture removal.  I, Darice Smock, CMA, am acting as scribe for RUFUS CHRISTELLA HOLY, MD.    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 They note that the area has been present for about 6 months increasing in size with time.  There is no history of previous treatment.  Reports no other new or changing lesions and has no other complaints today.  Medications and allergies: see patient chart.  Review of systems: Reviewed 8 systems and notable for the above skin cancer.  All other systems reviewed are unremarkable/negative, unless noted in the HPI. Past medical history, surgical history, family history, social history were also reviewed and are noted in the chart/questionnaire.    PHYSICAL EXAMINATION  General: Well-appearing, in no acute distress, alert and oriented x 4. Vitals reviewed in chart (if available).   Skin: Exam reveals a 1.6 x 1.1 cm erythematous papule and biopsy scar on the frontal scalp. There are rhytids, telangiectasias, and lentigines, consistent with photodamage.   Biopsy report(s) reviewed, confirming the diagnosis.   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 Due to location, size, histology, or recurrence and the likelihood of subclinical extension as well as the need to conserve normal surrounding tissue, the patient was deemed acceptable for Mohs micrographic surgery (MMS).  The nature and purpose of the procedure, associated benefits and risks including recurrence and scarring, possible complications such as pain, infection, and bleeding, and alternative methods of treatment if appropriate  were discussed with the patient during consent. The lesion location was verified by the patient, by reviewing previous notes, pathology  reports, and by photographs as well as angulation measurements if available.  Informed consent was reviewed and signed by the patient, and timeout was performed at 10:00 AM. See op note below.  2. For the photodamage and solar lentigines, sun protection discussed/information given on OTC sunscreens, and we recommend continued regular follow-up with primary dermatologist every 6 months or sooner for any growing, bleeding, or changing lesions. 3. Prognosis and future surveillance discussed. 4. Letter with treatment outcome sent to referring provider. 5. Pain acetaminophen /ibuprofen  MOHS MICROGRAPHIC SURGERY AND RECONSTRUCTION  Initial size:   1.6 x 1.1 cm Surgical defect/wound size: 2.3 x 1.5 cm Anesthesia:    0.33% lidocaine with 1:200,000 epinephrine EBL:    <5 mL Complications:  None Repair type:   Complex SQ suture:   3-0 Vicryl Cutaneous suture:  4-0 Polyprolene Final size of the repair: 3.5 cm  Stages: 1  STAGE I: Anesthesia achieved with 0.5% lidocaine with 1:200,000 epinephrine. ChloraPrep applied. 2 section(s) excised using Mohs technique (this includes total peripheral and deep tissue margin excision and evaluation with frozen sections, excised and interpreted by the same physician). The tumor was first debulked and then excised with an approx. 2mm margin.  Hemostasis was achieved with electrocautery as needed.  The specimen was then oriented, subdivided/relaxed, inked, and processed using Mohs technique.    Frozen section analysis revealed a clear deep and peripheral margin.   Reconstruction  The surgical wound was then cleaned, prepped, and re-anesthetized as above. Wound edges were undermined extensively along at least one entire edge and at a distance equal to or greater than the width of the defect (see wound defect size above) in order to  achieve closure and decrease wound tension and anatomic distortion. Redundant tissue repair including standing cone removal was performed. Hemostasis was achieved with electrocautery. Subcutaneous and epidermal tissues were approximated with the above sutures. The surgical site was then lightly scrubbed with sterile, saline-soaked gauze. The area was then bandaged using Vaseline ointment, non-adherent gauze, gauze pads, and tape to provide an adequate pressure dressing. The patient tolerated the procedure well, was given detailed written and verbal wound care instructions, and was discharged in good condition.   The patient will follow-up: 12 days.   Documentation: I have reviewed the above documentation for accuracy and completeness, and I agree with the above.  RUFUS CHRISTELLA HOLY, MD

## 2024-04-08 NOTE — Patient Instructions (Signed)

## 2024-04-15 ENCOUNTER — Encounter: Payer: Self-pay | Admitting: Dermatology

## 2024-04-19 ENCOUNTER — Encounter: Payer: Self-pay | Admitting: Dermatology

## 2024-04-19 ENCOUNTER — Ambulatory Visit: Admitting: Dermatology

## 2024-04-19 DIAGNOSIS — C44319 Basal cell carcinoma of skin of other parts of face: Secondary | ICD-10-CM

## 2024-04-19 DIAGNOSIS — Z85828 Personal history of other malignant neoplasm of skin: Secondary | ICD-10-CM

## 2024-04-19 DIAGNOSIS — C4492 Squamous cell carcinoma of skin, unspecified: Secondary | ICD-10-CM

## 2024-04-19 DIAGNOSIS — C44311 Basal cell carcinoma of skin of nose: Secondary | ICD-10-CM

## 2024-04-19 DIAGNOSIS — C4441 Basal cell carcinoma of skin of scalp and neck: Secondary | ICD-10-CM

## 2024-04-19 DIAGNOSIS — S0100XA Unspecified open wound of scalp, initial encounter: Secondary | ICD-10-CM

## 2024-04-19 DIAGNOSIS — C44310 Basal cell carcinoma of skin of unspecified parts of face: Secondary | ICD-10-CM

## 2024-04-19 DIAGNOSIS — T1490XD Injury, unspecified, subsequent encounter: Secondary | ICD-10-CM

## 2024-04-19 NOTE — Patient Instructions (Signed)

## 2024-04-19 NOTE — Progress Notes (Signed)
   Follow Up Visit   Subjective  Nichole Cox is a 89 y.o. female who presents for the following: follow up from Mohs surgery   The patient presents for follow up from Mohs surgery for a SCC on the frontal scalp, treated on 04/08/24, repaired with linear closure. The patient has been bandaging the wound as directed. The endorse the following concerns: none  The following portions of the chart were reviewed this encounter and updated as appropriate: medications, allergies, medical history  Review of Systems:  No other skin or systemic complaints except as noted in HPI or Assessment and Plan.  Objective  Well appearing patient in no apparent distress; mood and affect are within normal limits.  A focal examination was performed including scalp, head, face and frontal scalp All findings within normal limits unless otherwise noted below.  Healing wound with mild erythema  Relevant physical exam findings are noted in the Assessment and Plan.    Assessment & Plan    Healing Wound s/p Mohs for SCC, treated on frontal scalp, repaired with linear closure - Reassured that wound is healing well - No evidence of infection - No swelling, induration, purulence, dehiscence, or tenderness out of proportion to the clinical exam, see photo above - Discussed that scars take up to 12 months to mature from the date of surgery - Recommend SPF 30+ to scar daily to prevent purple color from UV exposure during scar maturation process - Discussed that erythema and raised appearance of scar will fade over the next 4-6 months - OK to start scar massage at 4-6 weeks post-op - Can consider silicone based products for scar healing starting at 6 weeks post-op - Ok to continue ointment daily to wound under a bandage for another week  HISTORY OF SQUAMOUS CELL CARCINOMA OF THE SKIN - No evidence of recurrence today - No lymphadenopathy - Recommend regular full body skin exams - Recommend daily broad  spectrum sunscreen SPF 30+ to sun-exposed areas, reapply every 2 hours as needed.  - Call if any new or changing lesions are noted between office visits  Basal Cell Carcinoma- Superficial- Right frontal scalp - Can consider imiquimod daily x 6 weeks for sBCC alternative to Mohs srugery  Remaining BCCs (Sclerotic on nose and left forehead, infiltrative) - Recommend Mohs surgery due to aggressive BCC subtypes.   Return in about 4 weeks (around 05/17/2024) for follow up and Mohs for other spots.  I, Darice Smock, CMA, am acting as scribe for RUFUS CHRISTELLA HOLY, MD.   Documentation: I have reviewed the above documentation for accuracy and completeness, and I agree with the above.  RUFUS CHRISTELLA HOLY, MD

## 2024-05-06 NOTE — Progress Notes (Unsigned)
 Location:  [facility]  POS: Clinic  Provider: Tawni America, ANP  Code Status: DNR Goals of Care:     03/08/2024    2:42 PM  Advanced Directives  Does Patient Have a Medical Advance Directive? Yes  Type of Estate agent of [place];Living will;Out of facility DNR (pink MOST or yellow form)  Does patient want to make changes to medical advance directive? Yes (Inpatient - patient requests chaplain consult to change a medical advance directive)  Copy of Healthcare Power of Attorney in Chart? Yes - validated most recent copy scanned in chart (See row information)  Pre-existing out of facility DNR order (yellow form or pink MOST form) Pink MOST/Yellow Form most recent copy in chart - Physician notified to receive inpatient order     Chief Complaint  Patient presents with   Follow-up    3 month follow up. Patient has concerns about her moles not healing properly     HPI:  [place], husband retired physician with memory loss and resides in the memory care unit  Hx of left SDH (not on anticoagulation and no fall) and complex partial seizure in 06/26/2020. Continues on Keppra  and followed by neurology with no new issues.  PAF: Seen by cardiology, can not take DOAC due to bleeding risk.  Rate controlled on Toprol . She denies chest pain or palpitations.    Did have a near syncope episode 1 day ago when standing quickly which resolved.   Macular degeneration, right eye is almost blind. Now getting injections in the left eye to prevent further vision loss.   HTN controlled   HLD LDL 141 01/25/22 previously refused statin   MMSE: 30/30 No reports of memory issues. Doing well in [place] which is appropriate due to her vision loss.   TSH 4.22    Had MOHS in July for [facility] and SCC noted to face Going for [facility] removal to nose next, wondering if she should proceed. Having issues with some slowed healing with the lesion to her forehead. Serous drainage from this area no  purulent matter   Reports reaction to flu vaccine with ocular symptoms   Past Medical History:  Diagnosis Date   Atrial fib/flutter, transient (HCC)    Basal cell carcinoma 03/09/2024   Left forehead - needs Mohs   Basal cell carcinoma 03/09/2024   Right forehead - needs Mohs   Basal cell carcinoma 03/09/2024   nasal tip - needs Mohs   Focal seizures (HCC)    Per [facility] new patient packet/ Right hand    Hearing loss    Hyperlipidemia    Hypertension    Kidney stone    Macular degeneration    Per [facility] new patient packet   Nephrolithiasis    SDH (subdural hematoma) (HCC)    Squamous cell carcinoma of skin 03/09/2024   Frontal scalp - Needs Mohs    Past Surgical History:  Procedure Laterality Date   CATARACT EXTRACTION  1997   Per [facility] new patient packet   KIDNEY STONE SURGERY     Per [facility] new patient packet   KIDNEY STONE SURGERY  1959, 36, 66   61 CUFF REPAIR  2010   SHOULDER SURGERY  2008   [employer] amnesia  2020    Allergies  Allergen Reactions   Scallops [Shellfish Allergy] Hives and Shortness Of Breath   Sulfa Antibiotics Rash    Outpatient Encounter Medications as of 05/10/2024  Medication Sig   cholecalciferol (VITAMIN D3) 25 MCG (  1000 UNIT) tablet Take 2,000 Units by mouth daily.   Cyanocobalamin (VITAMIN B 12 PO) Take 1,000 mcg/day by mouth daily.   latanoprost  (XALATAN ) 0.005 % ophthalmic solution Place 1 drop into both eyes at bedtime.   levETIRAcetam  (KEPPRA ) 500 MG tablet Take 1 tablet (500 mg total) by mouth 2 (two) times daily.   metoprolol  succinate (TOPROL -XL) 50 MG 24 hr tablet Take 1.5 tablets (75 mg total) by mouth daily. Take with or immediately following a meal.   Multiple Vitamins-Minerals (PRESERVISION AREDS) CAPS Take 1 capsule by mouth in the morning and at bedtime.   ramipril  (ALTACE ) 5 MG capsule Take 1 capsule (5 mg total) by mouth daily. Hold for systolic reading of blood pressure below 90.   acetaminophen  (TYLENOL ) 325 MG tablet  Take 2 tablets (650 mg total) by mouth 3 (three) times daily as needed.   No facility-administered encounter medications on file as of 05/10/2024.    Review of Systems:  Review of Systems  Constitutional:  Negative for activity change, appetite change, chills, diaphoresis, fatigue, fever and unexpected weight change.  HENT:  Positive for hearing loss. Negative for congestion.   Eyes:  Positive for visual disturbance (blind).  Respiratory:  Negative for cough, shortness of breath and wheezing.   Cardiovascular:  Positive for leg swelling (feet). Negative for chest pain and palpitations.  Gastrointestinal:  Negative for abdominal distention, abdominal pain, constipation and diarrhea.  Genitourinary:  Negative for difficulty urinating and dysuria.  Musculoskeletal:  Negative for arthralgias, back pain, gait problem, joint swelling and myalgias.  Skin:        Skin lesions   Neurological:  Negative for dizziness, tremors, seizures, syncope, facial asymmetry, speech difficulty, weakness, light-headedness, numbness and headaches.  Psychiatric/Behavioral:  Negative for agitation, behavioral problems and confusion.     Health Maintenance  Topic Date Due   INFLUENZA VACCINE  07/22/2024 (Originally 04/23/2024)   COVID-19 Vaccine (7 - 2024-25 season) 07/22/2024 (Originally 05/25/2023)   Zoster Vaccines- Shingrix (1 of 2) 08/10/2024 (Originally 11/06/1947)   Medicare Annual Wellness (AWV)  03/08/2025   DTaP/Tdap/Td (2 - Td or Tdap) 02/02/2031   Pneumococcal Vaccine: 50+ Years  Completed   HPV VACCINES  Aged Out   Meningococcal B Vaccine  Aged Out   DEXA SCAN  Discontinued    Physical Exam: Vitals:   05/10/24 1321  BP: 124/78  Pulse: 80  Temp: (!) 97.3 F (36.3 C)  SpO2: 99%  Weight: 152 lb (68.9 kg)  Height: 5' 4 (1.626 m)   Body mass index is 26.09 kg/m. Physical Exam Vitals and nursing note reviewed.  Constitutional:      Appearance: Normal appearance.  HENT:     Head:  Normocephalic and atraumatic.     Right Ear: Tympanic membrane normal.     Left Ear: Tympanic membrane normal.     Nose: Nose normal.     Mouth/Throat:     Mouth: Mucous membranes are moist.     Pharynx: Oropharynx is clear.  Eyes:     Conjunctiva/sclera: Conjunctivae normal.     Pupils: Pupils are equal, round, and reactive to light.  Cardiovascular:     Rate and Rhythm: Normal rate and regular rhythm.     Heart sounds: No murmur heard. Pulmonary:     Effort: Pulmonary effort is normal. No respiratory distress.     Breath sounds: Normal breath sounds. No wheezing.  Abdominal:     General: Bowel sounds are normal. There is no distension.  Palpations: Abdomen is soft.     Tenderness: There is no abdominal tenderness.  Musculoskeletal:     Cervical back: No rigidity.     Right lower leg: No edema.     Left lower leg: No edema.  Lymphadenopathy:     Cervical: No cervical adenopathy.  Skin:    General: Skin is warm and dry.     Comments: Healing incision site to forehead Basal cell noted to the tip of the nose.   Neurological:     General: No focal deficit present.     Mental Status: She is alert and oriented to person, place, and time. Mental status is at baseline.  Psychiatric:        Mood and Affect: Mood normal.     Labs reviewed: Basic Metabolic Panel: Recent Labs    10/30/23 0000 03/04/24 0000  NA 135* 136*  K 4.6 4.5  CL 101 101  CO2 24* 23*  BUN 12 12  CREATININE 0.8 0.9  CALCIUM 9.3 9.3  TSH 4.22  --    Liver Function Tests: Recent Labs    10/30/23 0000 03/04/24 0000  AST 23 23  ALT 28 28  ALKPHOS 64 63  ALBUMIN 3.8 3.9   No results for input(s): LIPASE, AMYLASE in the last 8760 hours. No results for input(s): AMMONIA in the last 8760 hours. CBC: Recent Labs    10/30/23 0000 03/04/24 0000  WBC 3.4 3.5  HGB 13.1 12.8  HCT 39 38  PLT 197 199   Lipid Panel: No results for input(s): CHOL, HDL, LDLCALC, TRIG, CHOLHDL,  LDLDIRECT in the last 8760 hours. Lab Results  Component Value Date   HGBA1C 5.6 06/26/2020    Procedures since last visit: No results found.  Assessment/Plan   Primary hypertension Controlled on metoprolol  and altace   Seizure (HCC) Hx of sdh Continue keppra   Stage 3a chronic kidney disease (HCC) Continue to periodically monitor BMP and avoid nephrotoxic agents   Vitamin D deficiency Started on Vit D after low level at 14.6 Recheck level  Macular degeneration of both eyes Visual deficits Received recent injection into the left eye per ophthalmology  BCC (basal cell carcinoma) Will f/u with dermatology for mohs Prior skin lesion removal to the forehead is slowly healing No s/s of infection     Labs/tests ordered:  * No order type specified *CBC BMP VIt D prior to apt  Next appt:  3 months   Total time :  time greater than 50% of total time spent doing pt counseling and coordination of care

## 2024-05-10 ENCOUNTER — Encounter: Payer: Self-pay | Admitting: Adult Health

## 2024-05-10 ENCOUNTER — Non-Acute Institutional Stay: Admitting: Adult Health

## 2024-05-10 VITALS — BP 124/78 | HR 80 | Temp 97.3°F | Ht 64.0 in | Wt 152.0 lb

## 2024-05-10 DIAGNOSIS — R569 Unspecified convulsions: Secondary | ICD-10-CM | POA: Diagnosis not present

## 2024-05-10 DIAGNOSIS — E559 Vitamin D deficiency, unspecified: Secondary | ICD-10-CM | POA: Insufficient documentation

## 2024-05-10 DIAGNOSIS — N1831 Chronic kidney disease, stage 3a: Secondary | ICD-10-CM | POA: Diagnosis not present

## 2024-05-10 DIAGNOSIS — I10 Essential (primary) hypertension: Secondary | ICD-10-CM | POA: Diagnosis not present

## 2024-05-10 DIAGNOSIS — H3530 Unspecified macular degeneration: Secondary | ICD-10-CM

## 2024-05-10 DIAGNOSIS — C44311 Basal cell carcinoma of skin of nose: Secondary | ICD-10-CM

## 2024-05-10 DIAGNOSIS — C4491 Basal cell carcinoma of skin, unspecified: Secondary | ICD-10-CM | POA: Insufficient documentation

## 2024-05-10 NOTE — Assessment & Plan Note (Signed)
 Hx of sdh Continue keppra 

## 2024-05-10 NOTE — Assessment & Plan Note (Signed)
Continue to periodically monitor BMP and avoid nephrotoxic agents  

## 2024-05-10 NOTE — Assessment & Plan Note (Signed)
 Visual deficits Received recent injection into the left eye per ophthalmology

## 2024-05-10 NOTE — Patient Instructions (Signed)
 Follow up with Dr Charlanne in 3 months with lab work prior to apt

## 2024-05-10 NOTE — Assessment & Plan Note (Signed)
 Will f/u with dermatology for mohs Prior skin lesion removal to the forehead is slowly healing No s/s of infection

## 2024-05-10 NOTE — Assessment & Plan Note (Addendum)
 Controlled on metoprolol  and altace 

## 2024-05-10 NOTE — Assessment & Plan Note (Signed)
 Started on Vit D after low level at 14.6 Recheck level

## 2024-05-18 ENCOUNTER — Encounter: Payer: Self-pay | Admitting: Dermatology

## 2024-05-25 ENCOUNTER — Ambulatory Visit (INDEPENDENT_AMBULATORY_CARE_PROVIDER_SITE_OTHER): Admitting: Dermatology

## 2024-05-25 ENCOUNTER — Encounter: Payer: Self-pay | Admitting: Dermatology

## 2024-05-25 VITALS — BP 160/87

## 2024-05-25 DIAGNOSIS — T8131XA Disruption of external operation (surgical) wound, not elsewhere classified, initial encounter: Secondary | ICD-10-CM | POA: Diagnosis not present

## 2024-05-25 DIAGNOSIS — L905 Scar conditions and fibrosis of skin: Secondary | ICD-10-CM | POA: Diagnosis not present

## 2024-05-25 DIAGNOSIS — C44311 Basal cell carcinoma of skin of nose: Secondary | ICD-10-CM

## 2024-05-25 DIAGNOSIS — L814 Other melanin hyperpigmentation: Secondary | ICD-10-CM | POA: Diagnosis not present

## 2024-05-25 DIAGNOSIS — C4491 Basal cell carcinoma of skin, unspecified: Secondary | ICD-10-CM

## 2024-05-25 DIAGNOSIS — L579 Skin changes due to chronic exposure to nonionizing radiation, unspecified: Secondary | ICD-10-CM

## 2024-05-25 DIAGNOSIS — Z85828 Personal history of other malignant neoplasm of skin: Secondary | ICD-10-CM

## 2024-05-25 NOTE — Patient Instructions (Signed)

## 2024-05-25 NOTE — Progress Notes (Signed)
 Follow-Up Visit   Subjective  Nichole Cox is a 89 y.o. 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 Lesion was biopsied by Erminio Like, PA-C on 03/09/2024. 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 Patient states that there are a few areas that will bleed when she washes the area. She applies Vaseline nightly.  The following portions of the chart were reviewed this encounter and updated as appropriate: medications, allergies, medical history  Review of Systems:  No other skin or systemic complaints except as noted in HPI or Assessment and Plan.  Objective  Well appearing patient in no apparent distress; mood and affect are within normal limits.  A focused examination was performed of the following areas: Nasal tip Relevant physical exam findings are noted in the Assessment and Plan.   nasal tip Healing biopsy site   Assessment & Plan   Scar s/p Mohs for a SCC on the frontal scalp, treated on 04/08/2024, repaired with a complex closure. - Reassured that wound has healed well - spitting suture removed - erupting internal sutures clipped.  - Discussed that scars take up to 12 months to mature from the date of surgery - Recommend SPF 30+ to scar daily to prevent purple color - OK to start scar massage at 4-6 weeks post-op - Can consider silicone based products for scar healing   BASAL CELL CARCINOMA (BCC), UNSPECIFIED SITE nasal tip Mohs surgery  Consent obtained: written  Anticoagulation: Is the patient taking prescription anticoagulant and/or aspirin prescribed/recommended by a physician? No   Was the anticoagulation regimen changed prior to Mohs? No    Anesthesia: Anesthesia method: local infiltration Local anesthetic: lidocaine 1% WITH epi  Procedure Details: Timeout: pre-procedure  verification complete Procedure Prep: patient was prepped and draped in usual sterile fashion Prep type: chlorhexidine Biopsy accession number: [id number] Biopsy lab: [facility] Date of biopsy: 03/09/2024 Pre-Op diagnosis: basal cell carcinoma BCC subtype: infiltrative and superficial MohsAIQ Surgical site (if tumor spans multiple areas, please select predominant area): nose Surgery side: midline Surgical site (from skin exam): nasal tip Pre-operative length (cm): 0.4 Pre-operative width (cm): 0.5 Indications for Mohs surgery: anatomic location where tissue conservation is critical Previously treated? No    Micrographic Surgery Details: Post-operative length (cm): 1.8 Post-operative width (cm): 1.5 Number of Mohs stages: 4 Cumulative additional sections past 5 per stage: 0 Post surgery depth of defect: dermis and subcutaneous fat  Stage 1    Tumor features identified on Mohs section: basal carcinoma  Stage 2    Tumor features identified on Mohs section: basal carcinoma  Stage 3    Tumor features identified on Mohs section: basal carcinoma  Stage 4    Tumor features identified on Mohs section: no tumor identified  Patient tolerance of procedure: tolerated well, no immediate complications  Reconstruction: Was the defect reconstructed?: No (Organagenesis Application scheduled.)    Antibiotics: Does patient meet AHA guidelines for endocarditis?: No   Does patient meet AHA guidelines for orthopedic prophylaxis?: No   Were antibiotics given on the day of surgery?: No   Did surgery breach mucosa, expose cartilage/bone, involve an area of lymphedema/inflamed/infected tissue? No      Return in about 2 days (around 05/27/2024) for Organogenesis application.  LILLETTE Rollene Gobble, RN, am acting as scribe for RUFUS CHRISTELLA HOLY, MD .  05/25/2024  HISTORY OF PRESENT ILLNESS  Nichole Cox is seen in consultation at the request of Erminio Like, PA-C for  biopsy-proven  Infiltrative Basal Cell Carcinoma on the nasal tip. They note that the area has been present for about 1 year e increasing in size with time.  There is no history of previous treatment.  Reports no other new or changing lesions and has no other complaints today.  Medications and allergies: see patient chart.  Review of systems: Reviewed 8 systems and notable for the above skin cancer.  All other systems reviewed are unremarkable/negative, unless noted in the HPI. Past medical history, surgical history, family history, social history were also reviewed and are noted in the chart/questionnaire.    PHYSICAL EXAMINATION  General: Well-appearing, in no acute distress, alert and oriented x 4. Vitals reviewed in chart (if available).   Skin: Exam reveals a 0.4 x 0.5 cm erythematous papule and biopsy scar on the left frontal scalp. There are rhytids, telangiectasias, and lentigines, consistent with photodamage.  Biopsy report(s) reviewed, confirming the diagnosis.   ASSESSMENT  1) Infiltrative Basal Cell Carcinoma on the nasal tip 2) photodamage 3) solar lentigines   PLAN   1. Due to location, size, histology, or recurrence and the likelihood of subclinical extension as well as the need to conserve normal surrounding tissue, the patient was deemed acceptable for Mohs micrographic surgery (MMS).  The nature and purpose of the procedure, associated benefits and risks including recurrence and scarring, possible complications such as pain, infection, and bleeding, and alternative methods of treatment if appropriate were discussed with the patient during consent. The lesion location was verified by the patient, by reviewing previous notes, pathology reports, and by photographs as well as angulation measurements if available.  Informed consent was reviewed and signed by the patient, and timeout was performed at 9:00 AM. See op note below.  2. For the photodamage and solar lentigines, sun  protection discussed/information given on OTC sunscreens, and we recommend continued regular follow-up with primary dermatologist every 6 months or sooner for any growing, bleeding, or changing lesions. 3. Prognosis and future surveillance discussed. 4. Letter with treatment outcome sent to referring provider. 5. Pain acetaminophen /ibuprofen   MOHS MICROGRAPHIC SURGERY AND RECONSTRUCTION  Initial size:   0.4 x 0.5 cm Surgical defect/wound size: 1.8 x 1.5 cm Anesthesia:    0.33% lidocaine with 1:200,000 epinephrine EBL:    <5 mL Complications:  None Repair type:   Second Intention  Stages: 4  STAGE I: Anesthesia achieved with 0.5% lidocaine with 1:200,000 epinephrine. ChloraPrep applied. 1 section(s) excised using Mohs technique (this includes total peripheral and deep tissue margin excision and evaluation with frozen sections, excised and interpreted by the same physician). The tumor was first debulked and then excised with an approx. 2 mm margin.  Hemostasis was achieved with electrocautery as needed.  The specimen was then oriented, subdivided/relaxed, inked, and processed using Mohs technique.    Frozen section analysis revealed a positive margin for thin cords or strands of basaloid tumor cells that deeply infiltrate the surrounding stroma, often with irregular, angulated edges, appearing as a permeating invasion pattern at the tumor margins; these strands are embedded within a dense, collagenous stroma, with less prominent peripheral palisading and retraction in the deep and peripheral margin.    STAGE II: An additional 2 mm margin was excised.  Hemostasis was achieved with electrocautery as needed.  The specimen was then oriented, subdivided/relaxed, inked, and processed using Mohs technique.  Frozen section analysis revealed a positive margin for thin cords or strands of basaloid tumor cells that deeply infiltrate the surrounding stroma, often with irregular, angulated edges, appearing  as a permeating invasion pattern at the tumor margins; these strands are embedded within a dense, collagenous stroma, with less prominent peripheral palisading and retraction in the deep and peripheal margin.  STAGE III: An additional 2 mm margin was excised.  Hemostasis was achieved with electrocautery as needed.  The specimen was then oriented, subdivided/relaxed, inked, and processed using Mohs technique.   Frozen section analysis revealed a positive margin for  multiple, small buds of basaloid cells descending from the epidermis with no dermal invasion in the peripheral margin.  STAGE IV: An additional 2 mm margin was excised.  Hemostasis was achieved with electrocautery as needed.  The specimen was then oriented, subdivided/relaxed, inked, and processed using Mohs technique. Evaluation of slides by the Mohs surgeon revealed clear tumor margins.  Reconstruction  Delayed Skin Graft/Allograft  The decision to delay the skin graft for the reconstruction of the Mohs wound until is based on the goal of allowing the wound to undergo secondary intention healing. This approach facilitates the gradual granulation of tissue and the reduction of wound depth, which can improve the overall quality and viability of the eventual graft site. By allowing the wound to heal partially through secondary intention, we anticipate enhanced wound bed preparation, optimizing conditions for graft take and minimizing the risk of graft failure. This waiting period also allows for a better assessment of the wound's healing potential and ensures that the tissue is sufficiently vascularized, reducing the likelihood of complications such as graft necrosis or infection. Therefore, the delayed grafting is a strategic measure to enhance the long-term outcome of the reconstructive procedure. Will apply Organogenesis Via Matrix Allograft at next visit.   The patient will follow up in: 2 days  Documentation: I have reviewed the above  documentation for accuracy and completeness, and I agree with the above.  RUFUS CHRISTELLA HOLY, MD

## 2024-05-27 ENCOUNTER — Encounter: Payer: Self-pay | Admitting: Dermatology

## 2024-05-27 ENCOUNTER — Ambulatory Visit (INDEPENDENT_AMBULATORY_CARE_PROVIDER_SITE_OTHER): Admitting: Dermatology

## 2024-05-27 DIAGNOSIS — T1490XD Injury, unspecified, subsequent encounter: Secondary | ICD-10-CM

## 2024-05-27 DIAGNOSIS — Z48817 Encounter for surgical aftercare following surgery on the skin and subcutaneous tissue: Secondary | ICD-10-CM

## 2024-05-27 DIAGNOSIS — Z85828 Personal history of other malignant neoplasm of skin: Secondary | ICD-10-CM

## 2024-05-27 DIAGNOSIS — C4491 Basal cell carcinoma of skin, unspecified: Secondary | ICD-10-CM

## 2024-05-27 DIAGNOSIS — S0120XA Unspecified open wound of nose, initial encounter: Secondary | ICD-10-CM | POA: Diagnosis not present

## 2024-05-27 NOTE — Progress Notes (Addendum)
 Follow Up Visit   Subjective  Nichole Cox is a 89 y.o. female who presents for the following: follow up from Mohs surgery   The patient presents for follow up from Mohs surgery for a BCC on the nasal tip, treated on 05/25/24, repaired with 2nd intention. The patient has been bandaging the wound as directed. The endorse the following concerns: none.   Patient is accompanied by her daughter.  The following portions of the chart were reviewed this encounter and updated as appropriate: medications, allergies, medical history  Review of Systems:  No other skin or systemic complaints except as noted in HPI or Assessment and Plan.  Objective  Well appearing patient in no apparent distress; mood and affect are within normal limits.  A focal examination was performed including scalp, head, face. All findings within normal limits unless otherwise noted below.  Healing wound with mild erythema  Relevant physical exam findings are noted in the Assessment and Plan.    Assessment & Plan   Healing Wound s/p Mohs for [facility] on the nasal tip, treated on 05/25/24, repaired with 2nd intention - Reassured that wound is healing well - No evidence of infection - No swelling, induration, purulence, dehiscence, or tenderness out of proportion to the clinical exam, see photo above - Discussed that scars take up to 12 months to mature from the date of surgery - Recommend SPF 30+ to scar daily to prevent purple color from UV exposure during scar maturation process - Discussed that erythema and raised appearance of scar will fade over the next 4-6 months - OK to start scar massage at 4-6 weeks post-op - Can consider silicone based products for scar healing starting at 6 weeks post-op - Ok to continue ointment daily to wound under a bandage for another week  Wound Bed Preparation  The patient is being prepared for allograft skin graft procedure . The wound bed is thoroughly assessed and prepared to  optimize graft take and healing. The necrotic tissue is debrided with a curette to the cartilage and to create a clean, viable base, and any infected tissue is removed to reduce the risk of graft failure. Hemostasis is achieved to prevent bleeding complications during the grafting process. The wound edges are carefully undermined, and the underlying tissues are inspected for adequate vascularity to support the graft. The wound is then thoroughly irrigated, and the area is prepped with antiseptic solutions to minimize the risk of infection. Once the wound bed is adequately prepared, it is covered with a moist dressing in preparation for the skin graft placement.  Wound size  1.8 x 1.5 cm  Allograft Application  Location of Defect: nasal tip Defect Measurements (L x W ): 1.8 x 1.5 Reason Defect Not Closed Primarily: Large wound in a cosmetically sensitive area and would require two stage repair Reason for Choosing Skin Substitute vs. Secondary Intention Healing: Increase healing time over cartilage  Treatment Plan: Skin Substitute Used: Via Matrix Amnion Allograft Frequency of Application: weekly Anticipated Number of Applications: 8-10  Rationale for Chosen Skin Substitute: Smoking Cessation Discussed (if applicable):NA  Risks and Complications: All procedural risks, benefits, and alternatives were discussed with the patient.  Patient verbalized understanding and consented to treatment.  Amount of Skin Substitute Used: 4 units used, 2 discarded  Total cm Applied: Wastage: []  Yes []  No If yes, amount wasted:  Product Information:  [id number] Product Code 585-005 Fixation Method: Bandaged  Manufacturer's Intended Use Statement (for VIA Matrix)  VIA Matrix  is intended for use as a tissue barrier or protective covering for wounds. It is applied at a site of injury to protect wounds or burns from the surrounding environment or where a barrier is desired. The intended use includes  the management of acute and chronic wounds such as partial and full thickness wounds, surgical wounds, trauma wounds, and wounds with exposed tendon, muscle, joint capsule, and bone.  HISTORY OF BASAL CELL CARCINOMA OF THE SKIN - No evidence of recurrence today - Recommend regular full body skin exams - Recommend daily broad spectrum sunscreen SPF 30+ to sun-exposed areas, reapply every 2 hours as needed.  - Call if any new or changing lesions are noted between office visits  Return in about 1 week (around 06/03/2024) for wound check appolgraft application.  I, Darice Smock, CMA, am acting as scribe for RUFUS CHRISTELLA HOLY, MD.   Documentation: I have reviewed the above documentation for accuracy and completeness, and I agree with the above.  RUFUS CHRISTELLA HOLY, MD

## 2024-05-27 NOTE — Patient Instructions (Signed)

## 2024-06-03 ENCOUNTER — Encounter: Payer: Self-pay | Admitting: Dermatology

## 2024-06-03 ENCOUNTER — Ambulatory Visit: Admitting: Dermatology

## 2024-06-03 DIAGNOSIS — Z5189 Encounter for other specified aftercare: Secondary | ICD-10-CM | POA: Diagnosis not present

## 2024-06-03 DIAGNOSIS — L539 Erythematous condition, unspecified: Secondary | ICD-10-CM | POA: Diagnosis not present

## 2024-06-03 DIAGNOSIS — S0120XA Unspecified open wound of nose, initial encounter: Secondary | ICD-10-CM

## 2024-06-03 DIAGNOSIS — Z85828 Personal history of other malignant neoplasm of skin: Secondary | ICD-10-CM

## 2024-06-03 DIAGNOSIS — C4491 Basal cell carcinoma of skin, unspecified: Secondary | ICD-10-CM

## 2024-06-03 DIAGNOSIS — L905 Scar conditions and fibrosis of skin: Secondary | ICD-10-CM

## 2024-06-03 DIAGNOSIS — T1490XD Injury, unspecified, subsequent encounter: Secondary | ICD-10-CM

## 2024-06-03 DIAGNOSIS — S0120XD Unspecified open wound of nose, subsequent encounter: Secondary | ICD-10-CM

## 2024-06-03 NOTE — Progress Notes (Addendum)
 Follow Up Visit   Subjective  Nichole Cox is a 89 y.o. female who presents for the following: follow up from Mohs surgery   The patient presents for follow up from Mohs surgery for a BCC on the nasal tip, treated on 05/25/24, repaired with 2nd intention. The patient has been bandaging the wound as directed. The endorse the following concerns: none  The following portions of the chart were reviewed this encounter and updated as appropriate: medications, allergies, medical history  Review of Systems:  No other skin or systemic complaints except as noted in HPI or Assessment and Plan.  Objective  Well appearing patient in no apparent distress; mood and affect are within normal limits.  A focal examination was performed including scalp, head, face. All findings within normal limits unless otherwise noted below.  Healing wound with mild erythema  Relevant physical exam findings are noted in the Assessment and Plan.    Assessment & Plan   Healing Wound s/p Mohs for [facility] on the nasal tip, treated on 05/25/24, repaired with 2nd intention - Reassured that wound is healing well - No evidence of infection - No swelling, induration, purulence, dehiscence, or tenderness out of proportion to the clinical exam, see photo above - Discussed that scars take up to 12 months to mature from the date of surgery - Recommend SPF 30+ to scar daily to prevent purple color from UV exposure during scar maturation process - Discussed that erythema and raised appearance of scar will fade over the next 4-6 months - OK to start scar massage at 4-6 weeks post-op - Can consider silicone based products for scar healing starting at 6 weeks post-op - Ok to continue ointment daily to wound under a bandage for another week  Wound Bed Preparation  The patient is being prepared for allograft skin graft procedure . The wound bed is thoroughly assessed and prepared to optimize graft take and healing. The necrotic  tissue is debrided with a curette to the cartilage and to create a clean, viable base, and any infected tissue is removed to reduce the risk of graft failure. Hemostasis is achieved to prevent bleeding complications during the grafting process. The wound edges are carefully undermined, and the underlying tissues are inspected for adequate vascularity to support the graft. The wound is then thoroughly irrigated, and the area is prepped with antiseptic solutions to minimize the risk of infection. Once the wound bed is adequately prepared, it is covered with a moist dressing in preparation for the skin graft placement.  Wound size  1.7 x 1.3 cm  Allograft Application  Location of Defect: nasal tip Defect Measurements (L x W ): 1.7 x 1.3 cm Reason Defect Not Closed Primarily: Large wound in a cosmetically sensitive area and would require two stage repair Reason for Choosing Skin Substitute vs. Secondary Intention Healing: Increase healing time over cartilage  Treatment Plan: Skin Substitute Used: Via Matrix Amnion Allograft Frequency of Application: weekly Anticipated Number of Applications: 8-10  Rationale for Chosen Skin Substitute: Smoking Cessation Discussed (if applicable):NA  Risks and Complications: All procedural risks, benefits, and alternatives were discussed with the patient.  Patient verbalized understanding and consented to treatment.  Amount of Skin Substitute Used: 3 units used, 3 discarded  Product Information:  [id number] 585-005 Fixation Method: Bandaged  Manufacturer's Intended Use Statement (for VIA Matrix)  VIA Matrix is intended for use as a tissue barrier or protective covering for wounds. It is applied at a site of injury to  protect wounds or burns from the surrounding environment or where a barrier is desired. The intended use includes the management of acute and chronic wounds such as partial and full thickness wounds, surgical wounds, trauma wounds, and  wounds with exposed tendon, muscle, joint capsule, and bone.  HISTORY OF BASAL CELL CARCINOMA OF THE SKIN - No evidence of recurrence today - Recommend regular full body skin exams - Recommend daily broad spectrum sunscreen SPF 30+ to sun-exposed areas, reapply every 2 hours as needed.  - Call if any new or changing lesions are noted between office visits   Return in about 1 week (around 06/10/2024) for wound check--apple jacks application.  I, Darice Smock, CMA, am acting as scribe for RUFUS CHRISTELLA HOLY, MD.   Documentation: I have reviewed the above documentation for accuracy and completeness, and I agree with the above.  RUFUS CHRISTELLA HOLY, MD

## 2024-06-03 NOTE — Patient Instructions (Signed)

## 2024-06-08 ENCOUNTER — Encounter: Admitting: Dermatology

## 2024-06-09 ENCOUNTER — Ambulatory Visit: Admitting: Physician Assistant

## 2024-06-10 ENCOUNTER — Ambulatory Visit (INDEPENDENT_AMBULATORY_CARE_PROVIDER_SITE_OTHER): Admitting: Dermatology

## 2024-06-10 ENCOUNTER — Encounter: Payer: Self-pay | Admitting: Dermatology

## 2024-06-10 DIAGNOSIS — S0120XA Unspecified open wound of nose, initial encounter: Secondary | ICD-10-CM

## 2024-06-10 DIAGNOSIS — S0120XD Unspecified open wound of nose, subsequent encounter: Secondary | ICD-10-CM

## 2024-06-10 DIAGNOSIS — T1490XD Injury, unspecified, subsequent encounter: Secondary | ICD-10-CM

## 2024-06-10 DIAGNOSIS — C4491 Basal cell carcinoma of skin, unspecified: Secondary | ICD-10-CM

## 2024-06-10 NOTE — Patient Instructions (Signed)

## 2024-06-10 NOTE — Progress Notes (Addendum)
 Follow Up Visit   Subjective  Nichole Cox is a 89 y.o. female who presents for the following: follow up from Mohs surgery   The patient presents for follow up from Mohs surgery for a BCC on the nasal tip, treated on 05/25/24, repaired with 2nd intention. The patient has been bandaging the wound as directed. The endorse the following concerns: none. She has been having allograft applications. Accompanied by her daughter.  The following portions of the chart were reviewed this encounter and updated as appropriate: medications, allergies, medical history  Review of Systems:  No other skin or systemic complaints except as noted in HPI or Assessment and Plan.  Objective  Well appearing patient in no apparent distress; mood and affect are within normal limits.  A focal examination was performed including scalp, head, face. All findings within normal limits unless otherwise noted below.  Healing wound with mild erythema  Relevant physical exam findings are noted in the Assessment and Plan.    Assessment & Plan   Healing Wound with exposed cartilage s/p Mohs for [facility] on the nasal tip, treated on 05/25/24, repaired with 2nd intention - Reassured that wound is healing well - No evidence of infection - No swelling, induration, purulence, dehiscence, or tenderness out of proportion to the clinical exam, see photo above - Discussed that scars take up to 12 months to mature from the date of surgery - will re-apply allograft today. - May consider full thickness skin graft if cartilage does not start having granulation tissue over it by next application  Wound Size 1.2 x 1.1 cm  Wound Bed Preparation  The patient is being prepared for allograft skin graft procedure . The wound bed is thoroughly assessed and prepared to optimize graft take and healing. The necrotic tissue is debrided with a curette to the cartilage and to create a clean, viable base, and any infected tissue is removed to  reduce the risk of graft failure. Hemostasis is achieved to prevent bleeding complications during the grafting process. The wound edges are carefully undermined, and the underlying tissues are inspected for adequate vascularity to support the graft. The wound is then thoroughly irrigated, and the area is prepped with antiseptic solutions to minimize the risk of infection. Once the wound bed is adequately prepared, it is covered with a moist dressing in preparation for the skin graft placement.  Allograft Application  Location of Defect: nasal tip Defect Measurements (L x W ): 1.2 x 1.1 cm Reason Defect Not Closed Primarily: Large wound in a cosmetically sensitive area and would require two stage repair Reason for Choosing Skin Substitute vs. Secondary Intention Healing: Increase healing time over cartilage  Treatment Plan: Skin Substitute Used: Via Matrix Amnion Allograft Frequency of Application: weekly Anticipated Number of Applications: 8-10  Rationale for Chosen Skin Substitute: Smoking Cessation Discussed (if applicable):NA  Risks and Complications: All procedural risks, benefits, and alternatives were discussed with the patient.  Patient verbalized understanding and consented to treatment.  Amount of Skin Substitute Used: 3 units used, 3 discarded  Product Information:  EPT [id number] Product code 585-005 Exp 05/02/2029 Fixation Method: Bandaged  Manufacturer's Intended Use Statement (for VIA Matrix)  VIA Matrix is intended for use as a tissue barrier or protective covering for wounds. It is applied at a site of injury to protect wounds or burns from the surrounding environment or where a barrier is desired. The intended use includes the management of acute and chronic wounds such as partial and full  thickness wounds, surgical wounds, trauma wounds, and wounds with exposed tendon, muscle, joint capsule, and bone.  HISTORY OF BASAL CELL CARCINOMA OF THE SKIN - No evidence of  recurrence today - Recommend regular full body skin exams - Recommend daily broad spectrum sunscreen SPF 30+ to sun-exposed areas, reapply every 2 hours as needed.  - Call if any new or changing lesions are noted between office visits    Return in about 1 week (around 06/17/2024) for wound check apple jack application.  I, Darice Smock, CMA, am acting as scribe for RUFUS CHRISTELLA HOLY, MD.   Documentation: I have reviewed the above documentation for accuracy and completeness, and I agree with the above.  RUFUS CHRISTELLA HOLY, MD

## 2024-06-17 ENCOUNTER — Encounter: Payer: Self-pay | Admitting: Dermatology

## 2024-06-17 ENCOUNTER — Ambulatory Visit (INDEPENDENT_AMBULATORY_CARE_PROVIDER_SITE_OTHER): Admitting: Dermatology

## 2024-06-17 VITALS — BP 114/91 | HR 92

## 2024-06-17 DIAGNOSIS — C4491 Basal cell carcinoma of skin, unspecified: Secondary | ICD-10-CM

## 2024-06-17 DIAGNOSIS — T1490XD Injury, unspecified, subsequent encounter: Secondary | ICD-10-CM

## 2024-06-17 DIAGNOSIS — S0120XA Unspecified open wound of nose, initial encounter: Secondary | ICD-10-CM | POA: Diagnosis not present

## 2024-06-17 DIAGNOSIS — S0120XD Unspecified open wound of nose, subsequent encounter: Secondary | ICD-10-CM

## 2024-06-17 NOTE — Progress Notes (Addendum)
 Follow-Up Visit   Subjective  Nichole Cox is a 89 y.o. female who presents for the following: Follow up for wound check and allograft application. She is accompanied by her daughter. She feels like things are going well.   The following portions of the chart were reviewed this encounter and updated as appropriate: medications, allergies, medical history  Review of Systems:  No other skin or systemic complaints except as noted in HPI or Assessment and Plan.  Objective  Well appearing patient in no apparent distress; mood and affect are within normal limits.  A focused examination was performed of the following areas: nose  Relevant exam findings are noted in the Assessment and Plan.       Assessment & Plan   Healing Wound with exposed cartilage s/p Mohs for [facility] on the nasal tip, treated on 05/25/24, healing by  2nd intention with allograft - Reassured that wound is healing well - No evidence of infection - No swelling, induration, purulence, dehiscence, or tenderness out of proportion to the clinical exam, see photo above - Discussed that scars take up to 12 months to mature from the date of surgery - will re-apply allograft today. - May consider full thickness skin graft if cartilage does not start having granulation tissue over it by next application  Wound Bed Preparation  The patient is being prepared for allograft skin graft procedure . The wound bed is thoroughly assessed and prepared to optimize graft take and healing. The necrotic tissue is debrided with a curette to the cartilage and to create a clean, viable base, and any infected tissue is removed to reduce the risk of graft failure. Hemostasis is achieved to prevent bleeding complications during the grafting process. The wound edges are carefully undermined, and the underlying tissues are inspected for adequate vascularity to support the graft. The wound is then thoroughly irrigated, and the area is prepped with  antiseptic solutions to minimize the risk of infection. Once the wound bed is adequately prepared, it is covered with a moist dressing in preparation for the skin graft placement.  Allograft Application  Location of Defect: nasal tip Defect Measurements (L x W ): 1.0 x 0.7 cm Reason Defect Not Closed Primarily: Large wound in a cosmetically sensitive area and would require two stage repair Reason for Choosing Skin Substitute vs. Secondary Intention Healing: Increase healing time over cartilage  Treatment Plan: Skin Substitute Used: Via Matrix Amnion Allograft Frequency of Application: weekly Anticipated Number of Applications: 8-10  Rationale for Chosen Skin Substitute: Smoking Cessation Discussed (if applicable):NA  Risks and Complications: All procedural risks, benefits, and alternatives were discussed with the patient.  Patient verbalized understanding and consented to treatment.  Amount of Skin Substitute Used: 3 units used, 3 discarded  Product Information:  RPT: [id number]  Product code: 585-005  Exp 05/07/2029 Fixation Method: Bandaged  Manufacturer's Intended Use Statement (for VIA Matrix)  VIA Matrix is intended for use as a tissue barrier or protective covering for wounds. It is applied at a site of injury to protect wounds or burns from the surrounding environment or where a barrier is desired. The intended use includes the management of acute and chronic wounds such as partial and full thickness wounds, surgical wounds, trauma wounds, and wounds with exposed tendon, muscle, joint capsule, and bone.   Wound Size 1.0 x 0.7 cm  Will consider doing a delayed full thickness skin graft on October 9 appointment, discussed this in depth with the patient.   HISTORY  OF BASAL CELL CARCINOMA OF THE SKIN - No evidence of recurrence today - Recommend regular full body skin exams - Recommend daily broad spectrum sunscreen SPF 30+ to sun-exposed areas, reapply every 2 hours as  needed.  - Call if any new or changing lesions are noted between office visits  Return in 5 days (on 06/22/2024) for follow up and Mohs appointment.  I, Berwyn Lesches, Surg Tech III, am acting as scribe for RUFUS CHRISTELLA HOLY, MD.   Documentation: I have reviewed the above documentation for accuracy and completeness, and I agree with the above.  RUFUS CHRISTELLA HOLY, MD

## 2024-06-22 ENCOUNTER — Ambulatory Visit: Admitting: Dermatology

## 2024-06-22 ENCOUNTER — Encounter: Payer: Self-pay | Admitting: Dermatology

## 2024-06-22 VITALS — BP 123/95 | HR 91 | Temp 97.4°F

## 2024-06-22 DIAGNOSIS — L578 Other skin changes due to chronic exposure to nonionizing radiation: Secondary | ICD-10-CM

## 2024-06-22 DIAGNOSIS — S0120XA Unspecified open wound of nose, initial encounter: Secondary | ICD-10-CM

## 2024-06-22 DIAGNOSIS — L905 Scar conditions and fibrosis of skin: Secondary | ICD-10-CM

## 2024-06-22 DIAGNOSIS — C44319 Basal cell carcinoma of skin of other parts of face: Secondary | ICD-10-CM | POA: Diagnosis not present

## 2024-06-22 DIAGNOSIS — C4491 Basal cell carcinoma of skin, unspecified: Secondary | ICD-10-CM

## 2024-06-22 DIAGNOSIS — T1490XD Injury, unspecified, subsequent encounter: Secondary | ICD-10-CM

## 2024-06-22 DIAGNOSIS — L814 Other melanin hyperpigmentation: Secondary | ICD-10-CM | POA: Diagnosis not present

## 2024-06-22 DIAGNOSIS — S0120XD Unspecified open wound of nose, subsequent encounter: Secondary | ICD-10-CM

## 2024-06-22 NOTE — Patient Instructions (Signed)

## 2024-06-22 NOTE — Progress Notes (Addendum)
 "  Follow-Up Visit   Subjective  Nichole Cox is a 89 y.o. female who presents for the following: Mohs Surgery for a Nodular and Infiltrative Basal Cell Carcinoma on the left forehead.  She is s/p Mohs surgery for a BCC on the nasal tip, treated on 05/25/24, healing by second intention with allograft application.   The following portions of the chart were reviewed this encounter and updated as appropriate: medications, allergies, medical history  Review of Systems:  No other skin or systemic complaints except as noted in HPI or Assessment and Plan.  Objective  Well appearing patient in no apparent distress; mood and affect are within normal limits.  A focused examination was performed of the following areas: Left forehead Relevant physical exam findings are noted in the Assessment and Plan.      Left Forehead Pink scaly papule with adjacent scar   Assessment & Plan   Healing Wound with exposed cartilage s/p Mohs for [facility] on the nasal tip, treated on 05/25/24, healing by  2nd intention with allograft - Reassured that wound is healing well - No evidence of infection - No swelling, induration, purulence, dehiscence, or tenderness out of proportion to the clinical exam, see photo above - Discussed that scars take up to 12 months to mature from the date of surgery - will re-apply allograft today. - s/p 4 allograft applications - May consider full thickness skin graft if cartilage does not start having granulation tissue over it by next application   Wound Size 1.0 x 0.7 cm   Wound Bed Preparation The patient is being prepared for a allograft skin graft procedure . The wound bed is thoroughly assessed and prepared to optimize graft take and healing. The necrotic tissue is debrided with a curette to create a clean, viable base, and any infected tissue is removed to reduce the risk of graft failure. Hemostasis is achieved to prevent bleeding complications during the grafting  process. The wound edges are carefully undermined, and the underlying tissues are inspected for adequate vascularity to support the graft. The wound is then thoroughly irrigated, and the area is prepped with antiseptic solutions to minimize the risk of infection. Once the wound bed is adequately prepared, it is covered with a moist dressing in preparation for the skin graft placement.   Allograft Application  Location of Defect: nasal tip Defect Measurements (L x W x D): 1.0 x 0.7 cm Reason Defect Not Closed Primarily: morbidity of flap needed for nasal tip repair Reason for Choosing Skin Substitute vs. Secondary Intention Healing: Due to length of time needed for 2nd intention healing and exposed cartilage  Treatment Plan: Skin Substitute Used: Via Matrix Amnion Organogenesis Frequency of Application: weekly Anticipated Number of Applications: 6-8  Rationale for Chosen Skin Substitute: Smoking Cessation Discussed (if applicable):NA Objective/Subjective Improvement in Tissue: marked improvement in granulation tissue and wound healing time  Risks and Complications: All procedural risks, benefits, and alternatives were discussed with the patient.  Patient verbalized understanding and consented to treatment.  Amount of Skin Substitute Used: 2 units used, 4 discarded  Product Information:  Lot #: Expiration Date: 03/24/2029 Fixation Method: Mastisol, Xeroform and Tegaderm  Manufacturers Intended Use Statement (for VIA Matrix)  VIA Matrix is intended for use as a tissue barrier or protective covering for wounds. It is applied at a site of injury to protect wounds or burns from the surrounding environment or where a barrier is desired. The intended use includes the management of acute and chronic  wounds such as partial and full thickness wounds, surgical wounds, trauma wounds, and wounds with exposed tendon, muscle, joint capsule, and bone.  Organogenesis Allograft Placed today 2 units  used, 4 units discarded    LPT: [id number]  Product code: 585-005  Via Matrix Amniotic Allograft Exp 03/24/2029 2 x 3 cm   HISTORY OF BASAL CELL CARCINOMA OF THE SKIN - No evidence of recurrence today - Recommend regular full body skin exams - Recommend daily broad spectrum sunscreen SPF 30+ to sun-exposed areas, reapply every 2 hours as needed.  - Call if any new or changing lesions are noted between office visits  BASAL CELL CARCINOMA (BCC), UNSPECIFIED SITE Left Forehead Mohs surgery  Consent obtained: written  Anticoagulation: Is the patient taking prescription anticoagulant and/or aspirin prescribed/recommended by a physician? No   Was the anticoagulation regimen changed prior to Mohs? No    Anesthesia: Anesthesia method: local infiltration Local anesthetic: lidocaine 1% WITH epi  Procedure Details: Timeout: pre-procedure verification complete Procedure Prep: patient was prepped and draped in usual sterile fashion Prep type: chlorhexidine Biopsy accession number: [id number] Biopsy lab: [facility] Date of biopsy: 03/09/2024 Frozen section biopsy performed: No   Specimen debulked: No   Pre-Op diagnosis: basal cell carcinoma BCC subtype: infiltrative and nodular MohsAIQ Surgical site (if tumor spans multiple areas, please select predominant area): forehead (non-eyebrow) Surgery side: left Surgical site (from skin exam): Left Forehead Pre-operative length (cm): 1.6 Pre-operative width (cm): 0.5 Indications for Mohs surgery: anatomic location where tissue conservation is critical Previously treated? No    Micrographic Surgery Details: Post-operative length (cm): 2.5 Post-operative width (cm): 2 Number of Mohs stages: 2 Cumulative additional sections past 5 per stage: 0 Post surgery depth of defect: skeletal muscle Is this a complex case (associate members only): No    Stage 1    Tumor features identified on Mohs section: basal carcinoma    Depth of  tumor invasion after stage: subcutaneous fat    Perineural invasion: no perineural invasion  Stage 2    Tumor features identified on Mohs section: no tumor identified    Depth of tumor invasion after stage: skeletal muscle    Perineural invasion: no perineural invasion  Patient tolerance of procedure: tolerated well, no immediate complications  Reconstruction: Was the defect reconstructed? Yes   Was reconstruction performed by the same Mohs surgeon? Yes   Setting of reconstruction: outpatient office When was reconstruction performed? same day Type of reconstruction: linear Linear reconstruction: complex  Opioids: Did the patient receive a prescription for opioid/narcotic related to Mohs surgery?: No    Antibiotics: Does patient meet AHA guidelines for endocarditis?: No   Does patient meet AHA guidelines for orthopedic prophylaxis?: No   Were antibiotics given on the day of surgery?: No   Did surgery breach mucosa, expose cartilage/bone, involve an area of lymphedema/inflamed/infected tissue? No    Skin repair Complexity:  Complex Final length (cm):  5 Informed consent: discussed and consent obtained   Timeout: patient name, date of birth, surgical site, and procedure verified   Procedure prep:  Patient was prepped and draped in usual sterile fashion Prep type:  Chlorhexidine Anesthesia: the lesion was anesthetized in a standard fashion   Anesthetic:  1% lidocaine w/ epinephrine 1-100,000 buffered w/ 8.4% NaHCO3 Reason for type of repair: reduce tension to allow closure and preserve normal anatomy   Undermining: edges undermined   Subcutaneous layers (deep stitches):  Suture size:  5-0 Suture type: Monocryl (poliglecaprone 25)   Stitches:  Buried vertical mattress Fine/surface layer approximation (top stitches):  Suture size:  6-0 Suture type: fast-absorbing plain gut   Stitches: simple running   Hemostasis achieved with: suture, pressure and electrodesiccation Outcome:  patient tolerated procedure well with no complications   Post-procedure details: sterile dressing applied and wound care instructions given   Dressing type: pressure dressing, petrolatum and bandage    OPEN WOUND OF NASAL CAVITY, SUBSEQUENT ENCOUNTER   SCAR   HEALING WOUND     Return in 6 days (on 06/28/2024) for 3 PM overbook per Dr. Austina Constantin for Bandage change/allograft; cancel the 9th appointment .  LILLETTE Berwyn Lesches, Surg Tech III, am acting as scribe for RUFUS CHRISTELLA HOLY, MD.    06/22/2024  HISTORY OF PRESENT ILLNESS  CRESSIE BETZLER is seen in consultation at the request of Erminio Like, PA-C for biopsy-proven Nodular and Infiltrative Basal Cell Carcinoma. They note that the area has been present for about 6 months increasing in size with time.  There is no history of previous treatment.  Reports no other new or changing lesions and has no other complaints today.  Medications and allergies: see patient chart.  Review of systems: Reviewed 8 systems and notable for the above skin cancer.  All other systems reviewed are unremarkable/negative, unless noted in the HPI. Past medical history, surgical history, family history, social history were also reviewed and are noted in the chart/questionnaire.    PHYSICAL EXAMINATION  General: Well-appearing, in no acute distress, alert and oriented x 4. Vitals reviewed in chart (if available).   Skin: Exam reveals a 1.6 x 0.5 cm erythematous papule and biopsy scar on the left forehead. There are rhytids, telangiectasias, and lentigines, consistent with photodamage.  Biopsy report(s) reviewed, confirming the diagnosis.   ASSESSMENT  1) Nodular and Infiltrative Basal Cell Carcinoma on the left forehead 2) photodamage 3) solar lentigines   PLAN   1. Due to location, size, histology, or recurrence and the likelihood of subclinical extension as well as the need to conserve normal surrounding tissue, the patient was deemed acceptable for  Mohs micrographic surgery (MMS).  The nature and purpose of the procedure, associated benefits and risks including recurrence and scarring, possible complications such as pain, infection, and bleeding, and alternative methods of treatment if appropriate were discussed with the patient during consent. The lesion location was verified by the patient, by reviewing previous notes, pathology reports, and by photographs as well as angulation measurements if available.  Informed consent was reviewed and signed by the patient, and timeout was performed at 10:00 AM. See op note below.  2. For the photodamage and solar lentigines, sun protection discussed/information given on OTC sunscreens, and we recommend continued regular follow-up with primary dermatologist every 6 months or sooner for any growing, bleeding, or changing lesions. 3. Prognosis and future surveillance discussed. 4. Letter with treatment outcome sent to referring provider. 5. Pain acetaminophen /ibuprofen  MOHS MICROGRAPHIC SURGERY AND RECONSTRUCTION  Initial size:   1.6 x 0.5 cm Surgical defect/wound size: 2.5 x 2.0 cm Anesthesia:    0.33% lidocaine with 1:200,000 epinephrine EBL:    <5 mL Complications:  None Repair type:   Complex SQ suture:   5-0 Monocryl Cutaneous suture:  6-0 Plain gut Final size of the repair: 5.0 cm  Stages: 2  STAGE I: Anesthesia achieved with 0.5% lidocaine with 1:200,000 epinephrine. ChloraPrep applied. 1 section(s) excised using Mohs technique (this includes total peripheral and deep tissue margin excision and evaluation with frozen sections, excised and interpreted  by the same physician). The tumor was first debulked and then excised with an approx. 2 mm margin.  Hemostasis was achieved with electrocautery as needed.  The specimen was then oriented, subdivided/relaxed, inked, and processed using Mohs technique.    Frozen section analysis revealed a positive margin for thin cords or strands of basaloid tumor  cells that deeply infiltrate the surrounding stroma, often with irregular, angulated edges, appearing as a permeating invasion pattern at the tumor margins; these strands are embedded within a dense, collagenous stroma, with less prominent peripheral palisading and retraction in the peripheral and deep margin.    STAGE II: An additional 2 mm margin was excised.  Hemostasis was achieved with electrocautery as needed.  The specimen was then oriented, subdivided/relaxed, inked, and processed using Mohs technique. Evaluation of slides by the Mohs surgeon revealed clear tumor margins.   Reconstruction  The surgical wound was then cleaned, prepped, and re-anesthetized as above. Wound edges were undermined extensively along at least one entire edge and at a distance equal to or greater than the width of the defect (see wound defect size above) in order to achieve closure and decrease wound tension and anatomic distortion. Redundant tissue repair including standing cone removal was performed. Hemostasis was achieved with electrocautery. Subcutaneous and epidermal tissues were approximated with the above sutures. The surgical site was then lightly scrubbed with sterile, saline-soaked gauze. The area was then bandaged using Vaseline ointment, non-adherent gauze, gauze pads, and tape to provide an adequate pressure dressing. The patient tolerated the procedure well, was given detailed written and verbal wound care instructions, and was discharged in good condition.   The patient will follow-up: 1 week.  During todays encounter, patient care and medical treatment coordination included a medically necessary evaluation and management service that was separate and distinct from the wound treated with allograft application. In addition to performing the procedure, the provider independently evaluated the patients overall clinical status, reviewed relevant history and interval changes, assessed and managed comorbid  conditions impacting healing and treatment decisions, reconciled medications, and coordinated ongoing medical care. Clinical decision-making extended beyond routine pre- and post-procedural assessment and required evaluation and management of other active diagnoses, thereby constituting a significant, separately identifiable E/M service that supports reporting an appropriate E/M code in addition to the procedural service.    Documentation: I have reviewed the above documentation for accuracy and completeness, and I agree with the above.  RUFUS CHRISTELLA HOLY, MD  "

## 2024-06-24 ENCOUNTER — Encounter: Payer: Self-pay | Admitting: Dermatology

## 2024-06-28 ENCOUNTER — Encounter: Payer: Self-pay | Admitting: Dermatology

## 2024-06-28 ENCOUNTER — Ambulatory Visit: Admitting: Dermatology

## 2024-06-28 DIAGNOSIS — L905 Scar conditions and fibrosis of skin: Secondary | ICD-10-CM

## 2024-06-28 DIAGNOSIS — S0120XA Unspecified open wound of nose, initial encounter: Secondary | ICD-10-CM

## 2024-06-28 DIAGNOSIS — C4491 Basal cell carcinoma of skin, unspecified: Secondary | ICD-10-CM

## 2024-06-28 DIAGNOSIS — T1490XD Injury, unspecified, subsequent encounter: Secondary | ICD-10-CM

## 2024-06-28 DIAGNOSIS — Z85828 Personal history of other malignant neoplasm of skin: Secondary | ICD-10-CM

## 2024-06-28 DIAGNOSIS — S0120XD Unspecified open wound of nose, subsequent encounter: Secondary | ICD-10-CM

## 2024-06-28 NOTE — Progress Notes (Addendum)
 "  Follow Up Visit   Subjective  Nichole Cox is a 89 y.o. female who presents for the following: follow up from Mohs surgery   The patient presents for follow up from Mohs surgery for a BCC on the nasal tip, treated on 05/25/24, repaired with 2nd intention. The patient has been bandaging the wound as directed. The endorse the following concerns: none  She had her allograft application placed last week and is doing well.   The following portions of the chart were reviewed this encounter and updated as appropriate: medications, allergies, medical history  Review of Systems:  No other skin or systemic complaints except as noted in HPI or Assessment and Plan.  Objective  Well appearing patient in no apparent distress; mood and affect are within normal limits.  A focal examination was performed including scalp, head, face. All findings within normal limits unless otherwise noted below.  Healing wound with mild erythema  Relevant physical exam findings are noted in the Assessment and Plan.         Assessment & Plan   Healing Wound s/p Mohs for [facility] on left forehead, treated on 06/22/24, repaired with linear closure - Reassured that wound is healing well - No evidence of infection - No swelling, induration, purulence, dehiscence, or tenderness out of proportion to the clinical exam, see photo above - Discussed that scars take up to 12 months to mature from the date of surgery - Recommend SPF 30+ to scar daily to prevent purple color from UV exposure during scar maturation process - Discussed that erythema and raised appearance of scar will fade over the next 4-6 months - OK to start scar massage at 4-6 weeks post-op - Can consider silicone based products for scar healing starting at 6 weeks post-op - Ok to continue ointment daily to wound under a bandage for another week  Healing Wound s/p Mohs for [facility] on nasal tip, treated on 05/25/24, healing by 2nd intention with allografts -  Reassured that wound is healing well - No evidence of infection - No swelling, induration, purulence, dehiscence, or tenderness out of proportion to the clinical exam, see photo above - Discussed that scars take up to 12 months to mature from the date of surgery - Recommend SPF 30+ to scar daily to prevent purple color from UV exposure during scar maturation process - Discussed that erythema and raised appearance of scar will fade over the next 4-6 months - s/p 5 allograft applications  Wound size  0.8 x 0.7 cm  Wound Bed Preparation The patient is being prepared for a allograft skin graft procedure . The wound bed is thoroughly assessed and prepared to optimize graft take and healing. The necrotic tissue is debrided to the cartilage and with a curette create a clean, viable base, and any infected tissue is removed to reduce the risk of graft failure. Hemostasis is achieved to prevent bleeding complications during the grafting process. The wound edges are carefully undermined, and the underlying tissues are inspected for adequate vascularity to support the graft. The wound is then thoroughly irrigated, and the area is prepped with antiseptic solutions to minimize the risk of infection. Once the wound bed is adequately prepared, it is covered with a moist dressing in preparation for the skin graft placement.   Allograft Application  Location of Defect: nasal tip Defect Measurements (L x W): 0.8 x 0.7 cm Reason Defect Not Closed Primarily: morbidity of flap required for closure of large nasal tip defect  Reason for Choosing Skin Substitute vs. Secondary Intention Healing: Cartilage exposed and need cushioning of additional tissue growth for defect filling  Treatment Plan: Skin Substitute Used: Via Matrix Amnion Organogenesis Frequency of Application: weekly Anticipated Number of Applications: 6-8  Rationale for Chosen Skin Substitute: Smoking Cessation Discussed (if  applicable):NA Objective/Subjective Improvement in Tissue: Marked improvement in tissue depth and cosmesis  Risks and Complications: All procedural risks, benefits, and alternatives were discussed with the patient.  Patient verbalized understanding and consented to treatment.  Amount of Skin Substitute Used: 2 units used, 4 discarded  Product Information:  Lot #: Expiration Date: 04/15/2029 Fixation Method: Bolstered with Xeroform and 5-0 polyprolene sutures Bolstered and Sutured  Manufacturers Intended Use Statement (for VIA Matrix)  VIA Matrix is intended for use as a tissue barrier or protective covering for wounds. It is applied at a site of injury to protect wounds or burns from the surrounding environment or where a barrier is desired. The intended use includes the management of acute and chronic wounds such as partial and full thickness wounds, surgical wounds, trauma wounds, and wounds with exposed tendon, muscle, joint capsule, and bone.  Organogenesis Allograft Placed today 2 units used, 4 units discarded    RPT: [id number]  Product code: 585-005  Via Matrix Amniotic Allograft Exp 04/15/2029 2 x 3 cm   HISTORY OF BASAL CELL CARCINOMA OF THE SKIN - No evidence of recurrence today - Recommend regular full body skin exams - Recommend daily broad spectrum sunscreen SPF 30+ to sun-exposed areas, reapply every 2 hours as needed.  - Call if any new or changing lesions are noted between office visits  OPEN WOUND OF NASAL CAVITY, SUBSEQUENT ENCOUNTER   HEALING WOUND   SCAR   BASAL CELL CARCINOMA (BCC), UNSPECIFIED SITE    Return in about 10 days (around 07/08/2024) for allograft application.  I, Darice Smock, CMA, am acting as scribe for RUFUS CHRISTELLA HOLY, MD.   During todays encounter, patient care and medical treatment coordination included a medically necessary evaluation and management service that was separate and distinct from the wound treated with allograft  application. In addition to performing the procedure, the provider independently evaluated the patients overall clinical status, reviewed relevant history and interval changes, assessed and managed comorbid conditions impacting healing and treatment decisions, reconciled medications, and coordinated ongoing medical care. Clinical decision-making extended beyond routine pre- and post-procedural assessment and required evaluation and management of other active diagnoses, thereby constituting a significant, separately identifiable E/M service that supports reporting an appropriate E/M code in addition to the procedural service.  We spent 45 min reviewing records, taking the patient history, providing face to face care with the patient, sending prescriptions.   Documentation: I have reviewed the above documentation for accuracy and completeness, and I agree with the above.  RUFUS CHRISTELLA HOLY, MD  "

## 2024-06-28 NOTE — Patient Instructions (Signed)

## 2024-07-01 ENCOUNTER — Ambulatory Visit: Admitting: Dermatology

## 2024-07-08 ENCOUNTER — Encounter: Payer: Self-pay | Admitting: Dermatology

## 2024-07-08 ENCOUNTER — Ambulatory Visit: Admitting: Dermatology

## 2024-07-08 DIAGNOSIS — L905 Scar conditions and fibrosis of skin: Secondary | ICD-10-CM | POA: Diagnosis not present

## 2024-07-08 DIAGNOSIS — S0120XA Unspecified open wound of nose, initial encounter: Secondary | ICD-10-CM

## 2024-07-08 DIAGNOSIS — T1490XD Injury, unspecified, subsequent encounter: Secondary | ICD-10-CM

## 2024-07-08 DIAGNOSIS — Z85828 Personal history of other malignant neoplasm of skin: Secondary | ICD-10-CM | POA: Diagnosis not present

## 2024-07-08 DIAGNOSIS — S0120XD Unspecified open wound of nose, subsequent encounter: Secondary | ICD-10-CM

## 2024-07-08 DIAGNOSIS — C4491 Basal cell carcinoma of skin, unspecified: Secondary | ICD-10-CM

## 2024-07-08 NOTE — Progress Notes (Addendum)
 "  Follow Up Visit   Subjective  Nichole Cox is a 89 y.o. female who presents for the following: follow up from Mohs surgery   The patient presents for follow up from Mohs surgery for a BCC on the nasal tip, treated on 05/25/24, repaired with 2nd intention. The patient has been bandaging the wound as directed. The endorse the following concerns: none.  The following portions of the chart were reviewed this encounter and updated as appropriate: medications, allergies, medical history  Review of Systems:  No other skin or systemic complaints except as noted in HPI or Assessment and Plan.  Objective  Well appearing patient in no apparent distress; mood and affect are within normal limits.  A focal examination was performed including scalp, head, face. All findings within normal limits unless otherwise noted below.  Healing wound with mild erythema     Relevant physical exam findings are noted in the Assessment and Plan.    Assessment & Plan   Healing Wound s/p Mohs for [facility] on the left forehead, treated on 06/22/24, repaired with linear closure - Reassured that wound has healed well - Discussed that scars take up to 12 months to mature from the date of surgery - Recommend SPF 30+ to scar daily to prevent purple color - OK to start scar massage at 4-6 weeks post-op - Can consider silicone based products for scar healing   Healing Wound s/p Mohs for [facility] on nasal tip, treated on 05/25/24, repaired with 2nd intention - Reassured that wound is healing well - No evidence of infection - No swelling, induration, purulence, dehiscence, or tenderness out of proportion to the clinical exam, see photo above - Discussed that scars take up to 12 months to mature from the date of surgery - Recommend SPF 30+ to scar daily to prevent purple color from UV exposure during scar maturation process - Discussed that erythema and raised appearance of scar will fade over the next 4-6 months - OK to  start scar massage at 4-6 weeks post-op - Can consider silicone based products for scar healing starting at 6 weeks post-op - s/p multiple allograft applications - will likely need FTSG in 1-2 weeks - s/p 6 allograft application  HISTORY OF BASAL CELL CARCINOMA OF THE SKIN - No evidence of recurrence today - Recommend regular full body skin exams - Recommend daily broad spectrum sunscreen SPF 30+ to sun-exposed areas, reapply every 2 hours as needed.  - Call if any new or changing lesions are noted between office visits   Wound size 1.1 x 1.0 cm  Wound Bed Preparation  The patient is being prepared for amnion allograft. The wound bed is thoroughly assessed and prepared to optimize graft take and healing. The necrotic tissue is debrided to curetted to create a clean, viable base, and any infected tissue is removed to reduce the risk of graft failure. Hemostasis is achieved to prevent bleeding complications during the grafting process. The wound edges are carefully undermined, and the underlying tissues are inspected for adequate vascularity to support the graft. The wound is then thoroughly irrigated, and the area is prepped with antiseptic solutions to minimize the risk of infection. Once the wound bed is adequately prepared, it is covered with a moist dressing in preparation for the skin graft placement.  Allograft Application  Location of Defect: nasal tip Defect Measurements (L x W x D): 0.8 x 0.7 cm Reason Defect Not Closed Primarily: morbidity of flap required for closure of large  nasal tip defect Reason for Choosing Skin Substitute vs. Secondary Intention Healing: Cartilage exposed and need cushioning of additional tissue growth for defect filling  Treatment Plan: Skin Substitute Used: Via Matrix Amnion Organogenesis Frequency of Application: weekly Anticipated Number of Applications: 6-8  Rationale for Chosen Skin Substitute: Smoking Cessation Discussed (if  applicable):NA Objective/Subjective Improvement in Tissue: Marked improvement in tissue depth and cosmesis  Risks and Complications: All procedural risks, benefits, and alternatives were discussed with the patient.  Patient verbalized understanding and consented to treatment.  Amount of Skin Substitute Used: 2 units used, 4 discarded  Product Information:  Lot #: Expiration Date: 04/15/2029 Fixation Method: Bolstered with Xeroform and 5-0 polyprolene sutures Bolstered and Sutured  Manufacturers Intended Use Statement (for VIA Matrix)  VIA Matrix is intended for use as a tissue barrier or protective covering for wounds. It is applied at a site of injury to protect wounds or burns from the surrounding environment or where a barrier is desired. The intended use includes the management of acute and chronic wounds such as partial and full thickness wounds, surgical wounds, trauma wounds, and wounds with exposed tendon, muscle, joint capsule, and bone.  Organogenesis Allograft Placed today 2 units used, 4 units discarded    RPT: [id number]  Product code: [id number]  Via Matrix Amniotic Allograft Exp 04/15/2029 2 x 3 cm    Return in about 10 days (around 07/18/2024) for procedure--wound check/graft placement.  I, Darice Smock, CMA, am acting as scribe for RUFUS CHRISTELLA HOLY, MD.   During todays encounter, patient care and medical treatment coordination included a medically necessary evaluation and management service that was separate and distinct from the wound treated with allograft application. In addition to performing the procedure, the provider independently evaluated the patients overall clinical status, reviewed relevant history and interval changes, assessed and managed comorbid conditions impacting healing and treatment decisions, reconciled medications, and coordinated ongoing medical care. Clinical decision-making extended beyond routine pre- and post-procedural assessment and  required evaluation and management of other active diagnoses, thereby constituting a significant, separately identifiable E/M service that supports reporting an appropriate E/M code in addition to the procedural service.  We spent 45 min reviewing records, taking the patient history, providing face to face care with the patient, sending prescriptions.  Documentation: I have reviewed the above documentation for accuracy and completeness, and I agree with the above.  RUFUS CHRISTELLA HOLY, MD  "

## 2024-07-08 NOTE — Patient Instructions (Signed)

## 2024-07-19 ENCOUNTER — Ambulatory Visit: Admitting: Dermatology

## 2024-07-19 ENCOUNTER — Encounter: Payer: Self-pay | Admitting: Dermatology

## 2024-07-19 DIAGNOSIS — S0120XD Unspecified open wound of nose, subsequent encounter: Secondary | ICD-10-CM

## 2024-07-19 DIAGNOSIS — T1490XD Injury, unspecified, subsequent encounter: Secondary | ICD-10-CM

## 2024-07-19 DIAGNOSIS — L905 Scar conditions and fibrosis of skin: Secondary | ICD-10-CM

## 2024-07-19 DIAGNOSIS — W908XXA Exposure to other nonionizing radiation, initial encounter: Secondary | ICD-10-CM

## 2024-07-19 DIAGNOSIS — S0120XA Unspecified open wound of nose, initial encounter: Secondary | ICD-10-CM | POA: Diagnosis not present

## 2024-07-19 DIAGNOSIS — L578 Other skin changes due to chronic exposure to nonionizing radiation: Secondary | ICD-10-CM

## 2024-07-19 DIAGNOSIS — C4491 Basal cell carcinoma of skin, unspecified: Secondary | ICD-10-CM

## 2024-07-19 NOTE — Progress Notes (Addendum)
 "  Follow Up Visit   Subjective  Nichole Cox is a 89 y.o. female who presents for the following: follow up from Mohs surgery   The patient presents for follow up from Mohs surgery for a BCC on the nasal tip, treated on 05/25/24, repaired with 2nd intention. The patient has been bandaging the wound as directed. The endorse the following concerns: none; accompanied by her daughter   The following portions of the chart were reviewed this encounter and updated as appropriate: medications, allergies, medical history  Review of Systems:  No other skin or systemic complaints except as noted in HPI or Assessment and Plan.  Objective  Well appearing patient in no apparent distress; mood and affect are within normal limits.  A focal examination was performed including scalp, head, face. All findings within normal limits unless otherwise noted below.  Healing wound with mild erythema  Relevant physical exam findings are noted in the Assessment and Plan.    Assessment & Plan   Scar s/p Mohs for [facility] on the left forehead, treated on 06/22/24, repaired with linear closure - Reassured that wound has healed well - Discussed that scars take up to 12 months to mature from the date of surgery - Recommend SPF 30+ to scar daily to prevent purple color - OK to start scar massage at 4-6 weeks post-op - Can consider silicone based products for scar healing  Healing Wound s/p Mohs for [facility] on the nasal tip, treated on 05/25/24, repaired with 2nd intention - Reassured that wound is healing well - No evidence of infection - No swelling, induration, purulence, dehiscence, or tenderness out of proportion to the clinical exam, see photo above - Discussed that scars take up to 12 months to mature from the date of surgery - Recommend SPF 30+ to scar daily to prevent purple color from UV exposure during scar maturation process - Discussed that erythema and raised appearance of scar will fade over the next 4-6  months - OK to start scar massage at 4-6 weeks post-op - Can consider silicone based products for scar healing starting at 6 weeks post-op - Ok to continue ointment daily to wound under a bandage for another week  S/p several allografts - Will prep wound but delay allograft or skin graft placement - No allograft application today, but may place delayed graft   Wound Size 0.6 x 0.5 cm  Wound Bed Preparation  The patient is being prepared for a delayed allograft application. The wound bed is thoroughly assessed and prepared to optimize graft take and healing. The necrotic tissue is debrided with a curette to create a clean, viable base, and any infected tissue is removed to reduce the risk of graft failure. Hemostasis is achieved to prevent bleeding complications during the grafting process. The wound edges are carefully undermined, and the underlying tissues are inspected for adequate vascularity to support the graft. The wound is then thoroughly irrigated, and the area is prepped with antiseptic solutions to minimize the risk of infection. Once the wound bed is adequately prepared, it is covered with a moist dressing in preparation for the allograft placement.  ACTINIC DAMAGE - chronic, secondary to cumulative UV radiation exposure/sun exposure over time - diffuse scaly erythematous macules with underlying dyspigmentation - Recommend daily broad spectrum sunscreen SPF 30+ to sun-exposed areas, reapply every 2 hours as needed.  - Recommend staying in the shade or wearing long sleeves, sun glasses (UVA+UVB protection) and wide brim hats (4-inch brim around the  entire circumference of the hat). - Call for new or changing lesions.   Return in about 2 weeks (around 08/02/2024) for wound check.  I, Darice Smock, CMA, am acting as scribe for RUFUS CHRISTELLA HOLY, MD.   On the date of service, I personally spent 30 minutes performing evaluation and management services, exclusive of time spent on any  separately reported procedure. This time included reviewing pertinent medical records and results, obtaining and reviewing the patients history, performing a medically appropriate evaluation, counseling and educating the patient regarding their medical conditions and treatment options, discussing risks and benefits, coordinating care as appropriate, and documenting the encounter. This time supports the reported E&M level.  Documentation: I have reviewed the above documentation for accuracy and completeness, and I agree with the above.  RUFUS CHRISTELLA HOLY, MD  "

## 2024-07-19 NOTE — Patient Instructions (Signed)

## 2024-07-29 LAB — COMPREHENSIVE METABOLIC PANEL WITH GFR
Calcium: 9.5 (ref 8.7–10.7)
eGFR: 57

## 2024-07-29 LAB — BASIC METABOLIC PANEL WITH GFR
BUN: 14 (ref 4–21)
CO2: 23 — AB (ref 13–22)
Chloride: 95 — AB (ref 99–108)
Creatinine: 0.9 (ref 0.5–1.1)
Glucose: 111
Potassium: 4.7 meq/L (ref 3.5–5.1)
Sodium: 131 — AB (ref 137–147)

## 2024-07-29 LAB — CBC AND DIFFERENTIAL
HCT: 41 (ref 36–46)
Hemoglobin: 13.7 (ref 12.0–16.0)
Platelets: 227 K/uL (ref 150–400)
WBC: 4.4

## 2024-07-29 LAB — VITAMIN D 25 HYDROXY (VIT D DEFICIENCY, FRACTURES): Vit D, 25-Hydroxy: 40.4

## 2024-07-29 LAB — CBC: RBC: 4 (ref 3.87–5.11)

## 2024-08-02 ENCOUNTER — Ambulatory Visit: Admitting: Dermatology

## 2024-08-10 ENCOUNTER — Encounter: Payer: Self-pay | Admitting: Internal Medicine

## 2024-08-11 ENCOUNTER — Ambulatory Visit: Admitting: Physician Assistant

## 2024-08-11 ENCOUNTER — Encounter: Payer: Self-pay | Admitting: Physician Assistant

## 2024-08-11 VITALS — BP 156/100

## 2024-08-11 DIAGNOSIS — L821 Other seborrheic keratosis: Secondary | ICD-10-CM | POA: Diagnosis not present

## 2024-08-11 DIAGNOSIS — L578 Other skin changes due to chronic exposure to nonionizing radiation: Secondary | ICD-10-CM | POA: Diagnosis not present

## 2024-08-11 DIAGNOSIS — Z85828 Personal history of other malignant neoplasm of skin: Secondary | ICD-10-CM | POA: Diagnosis not present

## 2024-08-11 DIAGNOSIS — L814 Other melanin hyperpigmentation: Secondary | ICD-10-CM | POA: Diagnosis not present

## 2024-08-11 DIAGNOSIS — D1801 Hemangioma of skin and subcutaneous tissue: Secondary | ICD-10-CM

## 2024-08-11 DIAGNOSIS — Z1283 Encounter for screening for malignant neoplasm of skin: Secondary | ICD-10-CM | POA: Diagnosis not present

## 2024-08-11 DIAGNOSIS — W908XXA Exposure to other nonionizing radiation, initial encounter: Secondary | ICD-10-CM

## 2024-08-11 DIAGNOSIS — D229 Melanocytic nevi, unspecified: Secondary | ICD-10-CM

## 2024-08-11 DIAGNOSIS — Z8589 Personal history of malignant neoplasm of other organs and systems: Secondary | ICD-10-CM

## 2024-08-11 NOTE — Progress Notes (Signed)
   Follow-Up Visit   Subjective  Nichole Cox is a 89 y.o. female ESTABLISHED PATIENT who presents for the following: Skin Cancer Screening and Full Body Skin Exam  The patient presents for Total-Body Skin Exam (TBSE) for skin cancer screening and mole check. The patient has spots, moles and lesions to be evaluated, some may be new or changing and the patient may have concern these could be cancer.  Has history of BCC and SCC.   Has undergone several Mohs procedures here with Dr. Paci and still has one sBCC on her right forehead that needs to be treated.     The following portions of the chart were reviewed this encounter and updated as appropriate: medications, allergies, medical history  Review of Systems:  No other skin or systemic complaints except as noted in HPI or Assessment and Plan.  Objective  Well appearing patient in no apparent distress; mood and affect are within normal limits.  A full examination was performed including scalp, head, eyes, ears, nose, lips, neck, chest, axillae, abdomen, back, buttocks, bilateral upper extremities, bilateral lower extremities, hands, feet, fingers, toes, fingernails, and toenails. All findings within normal limits unless otherwise noted below.   Relevant physical exam findings are noted in the Assessment and Plan.    Assessment & Plan   SKIN CANCER SCREENING PERFORMED TODAY.  ACTINIC DAMAGE - Chronic condition, secondary to cumulative UV/sun exposure - diffuse scaly erythematous macules with underlying dyspigmentation - Recommend daily broad spectrum sunscreen SPF 30+ to sun-exposed areas, reapply every 2 hours as needed.  - Staying in the shade or wearing long sleeves, sun glasses (UVA+UVB protection) and wide brim hats (4-inch brim around the entire circumference of the hat) are also recommended for sun protection.  - Call for new or changing lesions.  LENTIGINES, SEBORRHEIC KERATOSES, HEMANGIOMAS - Benign normal skin  lesions - Benign-appearing - Call for any changes  MELANOCYTIC NEVI - Tan-brown and/or pink-flesh-colored symmetric macules and papules - Benign appearing on exam today - Observation - Call clinic for new or changing moles - Recommend daily use of broad spectrum spf 30+ sunscreen to sun-exposed areas.   HISTORY OF BASAL CELL CARCINOMA OF THE SKIN - No evidence of recurrence today - one lesion remains to be treated on right forehead  - Recommend regular full body skin exams - Recommend daily broad spectrum sunscreen SPF 30+ to sun-exposed areas, reapply every 2 hours as needed.  - Call if any new or changing lesions are noted between office visits    HISTORY OF SQUAMOUS CELL CARCINOMA OF THE SKIN - No evidence of recurrence today - No lymphadenopathy - Recommend regular full body skin exams - Recommend daily broad spectrum sunscreen SPF 30+ to sun-exposed areas, reapply every 2 hours as needed.  - Call if any new or changing lesions are noted between office visits      SCREENING EXAM FOR SKIN CANCER   ACTINIC SKIN DAMAGE   HISTORY OF BASAL CELL CANCER   LENTIGINES   SEBORRHEIC KERATOSIS   CHERRY ANGIOMA   MULTIPLE BENIGN NEVI   HISTORY OF SQUAMOUS CELL CARCINOMA   Return in about 6 months (around 02/08/2025) for TBSE -- has f/u with Dr. Corey 11/25 already scheduled .    Documentation: I have reviewed the above documentation for accuracy and completeness, and I agree with the above.  Brynnlie Unterreiner K, PA-C

## 2024-08-11 NOTE — Patient Instructions (Signed)

## 2024-08-17 ENCOUNTER — Ambulatory Visit: Admitting: Dermatology

## 2024-08-17 ENCOUNTER — Encounter: Payer: Self-pay | Admitting: Dermatology

## 2024-08-17 DIAGNOSIS — C44319 Basal cell carcinoma of skin of other parts of face: Secondary | ICD-10-CM | POA: Diagnosis not present

## 2024-08-17 DIAGNOSIS — C4491 Basal cell carcinoma of skin, unspecified: Secondary | ICD-10-CM

## 2024-08-17 DIAGNOSIS — L539 Erythematous condition, unspecified: Secondary | ICD-10-CM

## 2024-08-17 DIAGNOSIS — L905 Scar conditions and fibrosis of skin: Secondary | ICD-10-CM | POA: Diagnosis not present

## 2024-08-17 NOTE — Patient Instructions (Signed)

## 2024-08-17 NOTE — Progress Notes (Unsigned)
   Follow Up Visit   Subjective  Nichole Cox is a 89 y.o. female who presents for the following: follow up from Mohs surgery   The patient presents for follow up from Mohs surgery for a BCC on the nasal tip, treated on 05/25/24, repaired with 2nd intention. The patient has been bandaging the wound as directed. The endorse the following concerns: none   The following portions of the chart were reviewed this encounter and updated as appropriate: medications, allergies, medical history  Review of Systems:  No other skin or systemic complaints except as noted in HPI or Assessment and Plan.  Objective  Well appearing patient in no apparent distress; mood and affect are within normal limits.  A focal examination was performed including scalp, head, face. All findings within normal limits unless otherwise noted below.  Healing wound with mild erythema  Relevant physical exam findings are noted in the Assessment and Plan.    Assessment & Plan    Healing Wound s/p Mohs for [facility], treated on 05/25/24, repaired with 2nd intention - Reassured that wound is healing well - No evidence of infection - No swelling, induration, purulence, dehiscence, or tenderness out of proportion to the clinical exam, see photo above - Discussed that scars take up to 12 months to mature from the date of surgery - Recommend SPF 30+ to scar daily to prevent purple color from UV exposure during scar maturation process - Discussed that erythema and raised appearance of scar will fade over the next 4-6 months - OK to start scar massage at 4-6 weeks post-op - Can consider silicone based products for scar healing starting at 6 weeks post-op - Ok to continue ointment daily to wound under a bandage for another week     No follow-ups on file.  I, Nichole Cox, Nichole Cox, am acting as scribe for Nichole CHRISTELLA HOLY, Nichole Cox.   Documentation: I have reviewed the above documentation for accuracy and completeness, and I agree with  the above.  Nichole CHRISTELLA HOLY, Nichole Cox

## 2024-08-17 NOTE — Progress Notes (Unsigned)
   Follow Up Visit   Subjective  Nichole Cox is a 89 y.o. female who presents for the following: follow up from Mohs surgery   The patient presents for follow up from Mohs surgery for a BCC on the nasal tip, treated on 05/25/24, repaired with an Allograft. The patient has been bandaging the wound as directed. The endorse the following concerns: none; accompanied by her daughter   The following portions of the chart were reviewed this encounter and updated as appropriate: medications, allergies, medical history  Review of Systems:  No other skin or systemic complaints except as noted in HPI or Assessment and Plan.  Objective  Well appearing patient in no apparent distress; mood and affect are within normal limits.  A focal examination was performed including scalp, head, face. All findings within normal limits unless otherwise noted below.  Healing wound with mild erythema  Relevant physical exam findings are noted in the Assessment and Plan.    Assessment & Plan   Healing Wound s/p Mohs for [facility] on the nasal tip, treated on 05/25/24, repaired with 2nd intention - Reassured that wound is healing well - No evidence of infection - No swelling, induration, purulence, dehiscence, or tenderness out of proportion to the clinical exam, see photo above - Discussed that scars take up to 12 months to mature from the date of surgery - Recommend SPF 30+ to scar daily to prevent purple color from UV exposure during scar maturation process - Discussed that erythema and raised appearance of scar will fade over the next 4-6 months - OK to start scar massage at 4-6 weeks post-op - Can consider silicone based products for scar healing starting at 6 weeks post-op - Ok to continue ointment daily to wound under a bandage for another week  S/p several allografts - Will prep wound but delay allograft or skin graft placement   Wound Size 0.6 x 0.5 cm   No follow-ups on file.  LILLETTE Rollene Gobble, RN, am acting as scribe for RUFUS CHRISTELLA HOLY, MD .   Documentation: I have reviewed the above documentation for accuracy and completeness, and I agree with the above.  RUFUS CHRISTELLA HOLY, MD

## 2024-08-31 ENCOUNTER — Encounter: Payer: Self-pay | Admitting: Internal Medicine

## 2024-08-31 ENCOUNTER — Non-Acute Institutional Stay: Admitting: Internal Medicine

## 2024-08-31 VITALS — BP 118/80 | HR 55 | Temp 97.3°F | Ht 64.0 in | Wt 150.8 lb

## 2024-08-31 DIAGNOSIS — H3530 Unspecified macular degeneration: Secondary | ICD-10-CM

## 2024-08-31 DIAGNOSIS — I10 Essential (primary) hypertension: Secondary | ICD-10-CM | POA: Diagnosis not present

## 2024-08-31 DIAGNOSIS — R569 Unspecified convulsions: Secondary | ICD-10-CM

## 2024-08-31 DIAGNOSIS — E559 Vitamin D deficiency, unspecified: Secondary | ICD-10-CM

## 2024-08-31 DIAGNOSIS — E871 Hypo-osmolality and hyponatremia: Secondary | ICD-10-CM | POA: Diagnosis not present

## 2024-08-31 NOTE — Progress Notes (Unsigned)
 Location:  [facility] Editor of Service:  Clinic (12)  Provider:   Code Status: DNR Goals of Care:     03/08/2024    2:42 PM  Advanced Directives  Does Patient Have a Medical Advance Directive? Yes  Type of Estate Agent of [place];Living will;Out of facility DNR (pink MOST or yellow form)  Does patient want to make changes to medical advance directive? Yes (Inpatient - patient requests chaplain consult to change a medical advance directive)  Copy of Healthcare Power of Attorney in Chart? Yes - validated most recent copy scanned in chart (See row information)  Pre-existing out of facility DNR order (yellow form or pink MOST form) Pink MOST/Yellow Form most recent copy in chart - Physician notified to receive inpatient order     Chief Complaint  Patient presents with   Follow-up    3 month follow up. Patient has concerns about lower back pain on right side.    HPI: Patient is a 89 y.o. female seen today for medical management of chronic diseases.    Lives in [place] in [place]   H/o PAF Seen by Dr Jordan No Anticoagulation due to her h/o SDH Patient very aware of her risk of Stroke Continues to stay stable No New recommendation per Dr Jordan   Left subdural hematoma in 10/21 when she was admitted in the hospital with right arm weakness and slurred speech.  Etiology was unclear as she did not have any falls anticoagulation. 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  History of  Present Illness   Nichole Cox is a 89 year old female who presents for follow-up on her dermatological treatment and concerns about recent side pain. Skin Cancer She has been treated for skin cancer since July, including Mohs surgery for squamous cell carcinoma on her nose with a large bandage in place for about two months, removed three to four weeks ago. She is scheduled for dermabrasion in January and for treatment of a small basal cell carcinoma on her face. Fall On November 4th she slipped during a adult nurse game. Side pain began four days later, is intermittent, and occurs with certain movements. She has kidney stones and is unsure if the pain is muscular or renal. She has had no recent falls apart from this incident. Bowel movements and sleep are normal.   She lives in a care facility, walks regularly, and participates in social activities with her husband and family. Her daughter lives nearby and visits often, and another daughter lives in [place] .       Past Medical History:  Diagnosis Date   Atrial fib/flutter, transient (HCC)    Basal cell carcinoma 03/09/2024   Left forehead - needs Mohs   Basal cell carcinoma 03/09/2024   Right forehead - needs Mohs   Basal cell carcinoma 03/09/2024   Nasal tip- Mohs with Dr. Corey 05/25/2024   Focal seizures (HCC)    Per [facility] new patient packet/ Right hand    Hearing loss  Hyperlipidemia    Hypertension    Kidney stone    Macular degeneration    Per [facility] new patient packet   Nephrolithiasis    SDH (subdural hematoma) (HCC)    Squamous cell carcinoma of skin 03/09/2024   Frontal Scalp- Mohs with Dr. Corey 04/08/2024    Past Surgical History:  Procedure Laterality Date   CATARACT EXTRACTION  1997   Per [facility] new patient packet   KIDNEY STONE SURGERY     Per [facility] new patient packet   KIDNEY STONE SURGERY  1959, 49, 24   61 CUFF REPAIR  2010   SHOULDER SURGERY  2008   [employer] amnesia  2020    Allergies   Allergen Reactions   Scallops [Shellfish Allergy] Hives and Shortness Of Breath   Sulfa Antibiotics Rash    Outpatient Encounter Medications as of 08/31/2024  Medication Sig   acetaminophen  (TYLENOL ) 325 MG tablet Take 2 tablets (650 mg total) by mouth 3 (three) times daily as needed.   acetaminophen  (TYLENOL ) 325 MG tablet Take 650 mg by mouth every 4 (four) hours as needed. Give 2 tablet by mouth every 4 hours as needed for pain Take every 4-6 hours prn for pain   cholecalciferol (VITAMIN D3) 25 MCG (1000 UNIT) tablet Take 2,000 Units by mouth daily.   Cyanocobalamin (VITAMIN B 12 PO) Take 1,000 mcg/day by mouth daily.   latanoprost  (XALATAN ) 0.005 % ophthalmic solution Place 1 drop into both eyes at bedtime.   levETIRAcetam  (KEPPRA ) 500 MG tablet Take 1 tablet (500 mg total) by mouth 2 (two) times daily.   metoprolol  succinate (TOPROL -XL) 50 MG 24 hr tablet Take 1.5 tablets (75 mg total) by mouth daily. Take with or immediately following a meal.   Multiple Vitamins-Minerals (PRESERVISION AREDS) CAPS Take 1 capsule by mouth in the morning and at bedtime.   ramipril  (ALTACE ) 5 MG capsule Take 1 capsule (5 mg total) by mouth daily. Hold for systolic reading of blood pressure below 90.   No facility-administered encounter medications on file as of 08/31/2024.    Review of Systems:  Review of Systems  Health Maintenance  Topic Date Due   COVID-19 Vaccine (7 - 2025-26 season) 09/16/2024 (Originally 05/24/2024)   Zoster Vaccines- Shingrix (1 of 2) 11/29/2024 (Originally 11/06/1947)   Medicare Annual Wellness (AWV)  03/08/2025   DTaP/Tdap/Td (2 - Td or Tdap) 02/02/2031   Pneumococcal Vaccine: 50+ Years  Completed   Influenza Vaccine  Completed   Meningococcal B Vaccine  Aged Out   Bone Density Scan  Discontinued    Physical Exam: Vitals:   08/31/24 1019  BP: 118/80  Pulse: (!) 55  Temp: (!) 97.3 F (36.3 C)  SpO2: 98%  Weight: 150 lb 12.8 oz (68.4 kg)  Height: 5' 4 (1.626 m)    Body mass index is 25.88 kg/m. Physical Exam  Labs reviewed: Basic Metabolic Panel: Recent Labs    10/30/23 0000 03/04/24 0000 07/29/24 0000  NA 135* 136* 131*  K 4.6 4.5 4.7  CL 101 101 95*  CO2 24* 23* 23*  BUN 12 12 14   CREATININE 0.8 0.9 0.9  CALCIUM 9.3 9.3 9.5  TSH 4.22  --   --    Liver Function Tests: Recent Labs    10/30/23 0000 03/04/24 0000  AST 23 23  ALT 28 28  ALKPHOS 64 63  ALBUMIN 3.8 3.9   No results for input(s): LIPASE, AMYLASE in the last 8760 hours. No results for input(s): AMMONIA in  the last 8760 hours. CBC: Recent Labs    10/30/23 0000 03/04/24 0000 07/29/24 0000  WBC 3.4 3.5 4.4  HGB 13.1 12.8 13.7  HCT 39 38 41  PLT 197 199 227   Lipid Panel: No results for input(s): CHOL, HDL, LDLCALC, TRIG, CHOLHDL, LDLDIRECT in the last 8760 hours. Lab Results  Component Value Date   HGBA1C 5.6 06/26/2020    Procedures since last visit: No results found.  Assessment/Plan 1. Primary hypertension (Primary) ***  2. Seizure (HCC) ***  3. Hyponatremia ***  4. Vitamin D  deficiency ***  5. Macular degeneration of both eyes, unspecified type ***    Labs/tests ordered:  BMP before appointment Next appt:  11/29/2024

## 2024-09-10 NOTE — Addendum Note (Signed)
 Addended by: COREY RUFUS HERO on: 09/10/2024 08:45 PM   Modules accepted: Level of Service

## 2024-10-05 ENCOUNTER — Ambulatory Visit: Admitting: Dermatology

## 2024-10-05 ENCOUNTER — Encounter: Payer: Self-pay | Admitting: Dermatology

## 2024-10-05 VITALS — BP 146/93 | HR 86 | Temp 97.3°F

## 2024-10-05 DIAGNOSIS — L578 Other skin changes due to chronic exposure to nonionizing radiation: Secondary | ICD-10-CM

## 2024-10-05 DIAGNOSIS — L570 Actinic keratosis: Secondary | ICD-10-CM

## 2024-10-05 DIAGNOSIS — C44319 Basal cell carcinoma of skin of other parts of face: Secondary | ICD-10-CM | POA: Diagnosis not present

## 2024-10-05 DIAGNOSIS — L814 Other melanin hyperpigmentation: Secondary | ICD-10-CM | POA: Diagnosis not present

## 2024-10-05 DIAGNOSIS — C4491 Basal cell carcinoma of skin, unspecified: Secondary | ICD-10-CM

## 2024-10-05 NOTE — Progress Notes (Signed)
 "  Follow-Up Visit   Subjective  Nichole Cox is a 89 y.o. female who presents for the following: Mohs for superficial basal cell carcinoma on the right forehead. Biopsy was performed on 03/09/2024 by Erminio Like PA-C. She is accompanied by her daughter.   She also has a lesion of concern on her left hand, thick and scaly, present for several months, comes and goes, not previously treated.   The following portions of the chart were reviewed this encounter and updated as appropriate: medications, allergies, medical history  Review of Systems:  No other skin or systemic complaints except as noted in HPI or Assessment and Plan.  Pt reports lesion has been present for approximately 7 months and denies  previous treatment. Pt denies pain and itchiness.   Pt reports personal history of skin cancer. Pt denies family history of skin cancer.   Objective  Well appearing patient in no apparent distress; mood and affect are within normal limits.  A focused examination was performed of the following areas: Right forehead Left hand Relevant physical exam findings are noted in the Assessment and Plan.   Right Forehead Pink scaly papule  Left Hand - Posterior Erythematous thin papules/macules with gritty scale.   Assessment & Plan   BASAL CELL CARCINOMA (BCC), UNSPECIFIED SITE Right Forehead - Mohs surgery  Consent obtained: written  Anticoagulation: Is the patient taking prescription anticoagulant and/or aspirin prescribed/recommended by a physician? No   Was the anticoagulation regimen changed prior to Mohs? No    Anesthesia: Anesthesia method: local infiltration Local anesthetic: lidocaine 1% WITH epi  Procedure Details: Timeout: pre-procedure verification complete Procedure Prep: patient was prepped and draped in usual sterile fashion Prep type: chlorhexidine Biopsy accession number: [id number] Biopsy lab: [facility] Date of biopsy: 03/09/2024 Frozen section  biopsy performed: No   Specimen debulked: No   Pre-Op diagnosis: basal cell carcinoma BCC subtype: superficial MohsAIQ Surgical site (if tumor spans multiple areas, please select predominant area): forehead (non-eyebrow) Surgery side: right Surgical site (from skin exam): Right Forehead Pre-operative length (cm): 1.8 Pre-operative width (cm): 0.6 Indications for Mohs surgery: anatomic location where tissue conservation is critical Previously treated? No    Micrographic Surgery Details: Post-operative length (cm): 2.4 Post-operative width (cm): 1.6 Number of Mohs stages: 1 Post surgery depth of defect: subcutaneous fat  Stage 1    Tumor features identified on Mohs section: no tumor identified  Patient tolerance of procedure: tolerated well, no immediate complications  Reconstruction: Was the defect reconstructed? Yes   Setting of reconstruction: outpatient office When was reconstruction performed? same day Type of reconstruction: linear Linear reconstruction: complex  - Skin repair Complexity:  Complex Final length (cm):  4.5 Informed consent: discussed and consent obtained   Timeout: patient name, date of birth, surgical site, and procedure verified   Procedure prep:  Patient was prepped and draped in usual sterile fashion Prep type:  Chlorhexidine Anesthesia: the lesion was anesthetized in a standard fashion   Anesthetic:  1% lidocaine w/ epinephrine 1-100,000 buffered w/ 8.4% NaHCO3 Reason for type of repair: reduce the risk of dehiscence, infection, and necrosis, allow closure of the large defect and preserve normal anatomy   Undermining: area extensively undermined   Subcutaneous layers (deep stitches):  Suture size:  5-0 Suture type: Monocryl (poliglecaprone 25)   Stitches:  Buried vertical mattress Fine/surface layer approximation (top stitches):  Suture size:  6-0 Suture type: fast-absorbing plain gut   Stitches: simple running   Hemostasis achieved with:  suture,  pressure and electrodesiccation Outcome: patient tolerated procedure well with no complications   Post-procedure details: sterile dressing applied and wound care instructions given   Dressing type: pressure dressing and bandage    AK (ACTINIC KERATOSIS) Left Hand - Posterior - Destruction of lesion - Left Hand - Posterior Complexity: simple   Destruction method: cryotherapy   Informed consent: discussed and consent obtained   Outcome: patient tolerated procedure well with no complications   Post-procedure details: wound care instructions given      Return in about 4 weeks (around 11/02/2024) for mohs follow up.  LILLETTE Virgle Boards, Mohs/Dermatology Tech am acting as a scribe for RUFUS CHRISTELLA HOLY, MD.   10/05/2024  HISTORY OF PRESENT ILLNESS  [name] is seen in consultation at the request of Erminio Like, PA-C for biopsy-proven Superficial Basal Cell Carcinoma on the right forehead. They note that the area has been present for about 1 year increasing in size with time.  There is no history of previous treatment.  Reports no other new or changing lesions and has no other complaints today.  Medications and allergies: see patient chart.  Review of systems: Reviewed 8 systems and notable for the above skin cancer.  All other systems reviewed are unremarkable/negative, unless noted in the HPI. Past medical history, surgical history, family history, social history were also reviewed and are noted in the chart/questionnaire.    PHYSICAL EXAMINATION  General: Well-appearing, in no acute distress, alert and oriented x 4. Vitals reviewed in chart (if available).   Skin: Exam reveals a 1.8  x 0.6 cm erythematous papule and biopsy scar on the right forehead. There are rhytids, telangiectasias, and lentigines, consistent with photodamage.  Biopsy report(s) reviewed, confirming the diagnosis.   ASSESSMENT  1) Superficial Basal Cell Carcinoma on the right forehead 2)  photodamage 3) solar lentigines   PLAN   1. Due to location, size, histology, or recurrence and the likelihood of subclinical extension as well as the need to conserve normal surrounding tissue, the patient was deemed acceptable for Mohs micrographic surgery (MMS).  The nature and purpose of the procedure, associated benefits and risks including recurrence and scarring, possible complications such as pain, infection, and bleeding, and alternative methods of treatment if appropriate were discussed with the patient during consent. The lesion location was verified by the patient, by reviewing previous notes, pathology reports, and by photographs as well as angulation measurements if available.  Informed consent was reviewed and signed by the patient, and timeout was performed at 10:00 AM. See op note below.  2. For the photodamage and solar lentigines, sun protection discussed/information given on OTC sunscreens, and we recommend continued regular follow-up with primary dermatologist every 6 months or sooner for any growing, bleeding, or changing lesions. 3. Prognosis and future surveillance discussed. 4. Letter with treatment outcome sent to referring provider. 5. Pain acetaminophen /ibuprofen  MOHS MICROGRAPHIC SURGERY AND RECONSTRUCTION  Initial size:   1.8 x 0.6 cm Surgical defect/wound size: 2.4 x 1.6 cm Anesthesia:    0.33% lidocaine with 1:200,000 epinephrine EBL:    <5 mL Complications:  None Repair type:   Complex SQ suture:   5-0 Monocryl Cutaneous suture:  6-0 Plain gut Final size of the repair: 4.5 cm  Stages: 1  STAGE I: Anesthesia achieved with 0.5% lidocaine with 1:200,000 epinephrine. ChloraPrep applied. 1 section(s) excised using Mohs technique (this includes total peripheral and deep tissue margin excision and evaluation with frozen sections, excised and interpreted by the same physician). The  tumor was first debulked and then excised with an approx. 2mm margin.  Hemostasis was  achieved with electrocautery as needed.  The specimen was then oriented, subdivided/relaxed, inked, and processed using Mohs technique.    Frozen section analysis revealed a clear deep and peripheral margin.  Reconstruction  The surgical wound was then cleaned, prepped, and re-anesthetized as above. Wound edges were undermined extensively along at least one entire edge and at a distance equal to or greater than the width of the defect (see wound defect size above) in order to achieve closure and decrease wound tension and anatomic distortion. Redundant tissue repair including standing cone removal was performed. Hemostasis was achieved with electrocautery. Subcutaneous and epidermal tissues were approximated with the above sutures. The surgical site was then lightly scrubbed with sterile, saline-soaked gauze. The area was then bandaged using Vaseline ointment, non-adherent gauze, gauze pads, and tape to provide an adequate pressure dressing. The patient tolerated the procedure well, was given detailed written and verbal wound care instructions, and was discharged in good condition.   The patient will follow-up: 4 weeks.      Documentation: I have reviewed the above documentation for accuracy and completeness, and I agree with the above.  RUFUS CHRISTELLA HOLY, MD  "

## 2024-10-05 NOTE — Patient Instructions (Signed)

## 2024-11-02 ENCOUNTER — Ambulatory Visit: Admitting: Dermatology

## 2024-11-29 ENCOUNTER — Encounter: Admitting: Adult Health

## 2025-02-15 ENCOUNTER — Ambulatory Visit: Admitting: Physician Assistant
